# Patient Record
Sex: Female | Born: 1990
Health system: Southern US, Community
[De-identification: ages and names within clinical notes are randomized; demographics above are authoritative.]

## PROBLEM LIST (undated history)

## (undated) DIAGNOSIS — G4730 Sleep apnea, unspecified: Secondary | ICD-10-CM

## (undated) DIAGNOSIS — F32A Depression, unspecified: Secondary | ICD-10-CM

## (undated) DIAGNOSIS — F329 Major depressive disorder, single episode, unspecified: Secondary | ICD-10-CM

## (undated) DIAGNOSIS — E282 Polycystic ovarian syndrome: Secondary | ICD-10-CM

## (undated) DIAGNOSIS — U071 COVID-19: Secondary | ICD-10-CM

## (undated) DIAGNOSIS — E039 Hypothyroidism, unspecified: Secondary | ICD-10-CM

## (undated) DIAGNOSIS — F319 Bipolar disorder, unspecified: Secondary | ICD-10-CM

## (undated) DIAGNOSIS — Z87442 Personal history of urinary calculi: Secondary | ICD-10-CM

## (undated) DIAGNOSIS — Z8742 Personal history of other diseases of the female genital tract: Secondary | ICD-10-CM

## (undated) DIAGNOSIS — J45909 Unspecified asthma, uncomplicated: Secondary | ICD-10-CM

## (undated) DIAGNOSIS — R011 Cardiac murmur, unspecified: Secondary | ICD-10-CM

## (undated) DIAGNOSIS — R7303 Prediabetes: Secondary | ICD-10-CM

## (undated) DIAGNOSIS — G8929 Other chronic pain: Secondary | ICD-10-CM

## (undated) DIAGNOSIS — M549 Dorsalgia, unspecified: Secondary | ICD-10-CM

## (undated) DIAGNOSIS — F419 Anxiety disorder, unspecified: Secondary | ICD-10-CM

## (undated) DIAGNOSIS — F909 Attention-deficit hyperactivity disorder, unspecified type: Secondary | ICD-10-CM

## (undated) DIAGNOSIS — I10 Essential (primary) hypertension: Secondary | ICD-10-CM

## (undated) DIAGNOSIS — T783XXA Angioneurotic edema, initial encounter: Secondary | ICD-10-CM

## (undated) DIAGNOSIS — D649 Anemia, unspecified: Secondary | ICD-10-CM

## (undated) DIAGNOSIS — N912 Amenorrhea, unspecified: Secondary | ICD-10-CM

## (undated) HISTORY — DX: Amenorrhea, unspecified: N91.2

## (undated) HISTORY — DX: Essential (primary) hypertension: I10

## (undated) HISTORY — PX: NO PAST SURGERIES: SHX2092

## (undated) HISTORY — DX: Depression, unspecified: F32.A

## (undated) HISTORY — DX: Hypothyroidism, unspecified: E03.9

## (undated) HISTORY — DX: Unspecified asthma, uncomplicated: J45.909

## (undated) HISTORY — PX: WISDOM TOOTH EXTRACTION: SHX21

## (undated) HISTORY — DX: Other chronic pain: G89.29

## (undated) HISTORY — DX: Angioneurotic edema, initial encounter: T78.3XXA

## (undated) HISTORY — DX: Personal history of other diseases of the female genital tract: Z87.42

---
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
## 2015-04-21 DIAGNOSIS — G8929 Other chronic pain: Secondary | ICD-10-CM | POA: Insufficient documentation

## 2015-04-21 DIAGNOSIS — D229 Melanocytic nevi, unspecified: Secondary | ICD-10-CM | POA: Insufficient documentation

## 2015-04-21 DIAGNOSIS — E282 Polycystic ovarian syndrome: Secondary | ICD-10-CM | POA: Insufficient documentation

## 2015-07-19 DIAGNOSIS — E781 Pure hyperglyceridemia: Secondary | ICD-10-CM | POA: Insufficient documentation

## 2016-02-23 ENCOUNTER — Emergency Department (HOSPITAL_BASED_OUTPATIENT_CLINIC_OR_DEPARTMENT_OTHER)
Admission: EM | Admit: 2016-02-23 | Discharge: 2016-02-24 | Disposition: A | Discharge status: Home / Self Care | Payer: Medicaid Other | Attending: Emergency Medicine | Admitting: Emergency Medicine

## 2016-02-23 ENCOUNTER — Encounter (HOSPITAL_BASED_OUTPATIENT_CLINIC_OR_DEPARTMENT_OTHER): Payer: Self-pay | Admitting: Emergency Medicine

## 2016-02-23 DIAGNOSIS — Z79899 Other long term (current) drug therapy: Secondary | ICD-10-CM | POA: Diagnosis not present

## 2016-02-23 DIAGNOSIS — R519 Headache, unspecified: Secondary | ICD-10-CM

## 2016-02-23 DIAGNOSIS — R51 Headache: Secondary | ICD-10-CM | POA: Insufficient documentation

## 2016-02-23 HISTORY — DX: Bipolar disorder, unspecified: F31.9

## 2016-02-23 HISTORY — DX: Polycystic ovarian syndrome: E28.2

## 2016-02-23 MED ORDER — DIPHENHYDRAMINE HCL 50 MG/ML IJ SOLN
25.0000 mg | Freq: Once | INTRAMUSCULAR | Status: AC
  Administered 2016-02-23: 25 mg via INTRAVENOUS
  Filled 2016-02-23: qty 1

## 2016-02-23 MED ORDER — SODIUM CHLORIDE 0.9 % IV BOLUS (SEPSIS)
1000.0000 mL | Freq: Once | INTRAVENOUS | Status: AC
  Administered 2016-02-23: 1000 mL via INTRAVENOUS

## 2016-02-23 MED ORDER — DEXAMETHASONE SODIUM PHOSPHATE 10 MG/ML IJ SOLN
10.0000 mg | Freq: Once | INTRAMUSCULAR | Status: AC
  Administered 2016-02-23: 10 mg via INTRAVENOUS
  Filled 2016-02-23: qty 1

## 2016-02-23 MED ORDER — METOCLOPRAMIDE HCL 5 MG/ML IJ SOLN
10.0000 mg | Freq: Once | INTRAMUSCULAR | Status: AC
  Administered 2016-02-23: 10 mg via INTRAVENOUS
  Filled 2016-02-23: qty 2

## 2016-02-23 MED ORDER — KETOROLAC TROMETHAMINE 30 MG/ML IJ SOLN
30.0000 mg | Freq: Once | INTRAMUSCULAR | Status: AC
  Administered 2016-02-23: 30 mg via INTRAVENOUS
  Filled 2016-02-23: qty 1

## 2016-02-23 MED ORDER — SODIUM CHLORIDE 0.9 % IV BOLUS (SEPSIS)
1000.0000 mL | Freq: Once | INTRAVENOUS | Status: AC
Start: 2016-02-23 — End: 2016-02-23
  Administered 2016-02-23: 1000 mL via INTRAVENOUS

## 2016-02-23 NOTE — ED Notes (Signed)
PA made aware of pt's response to medication. Awaiting for new orders to be placed.

## 2016-02-23 NOTE — ED Triage Notes (Signed)
Pt sent from UC c/o HA x 24 hours. No hx of migraines. Reports hx of "spinal fluid build up in my brain". Taking ibuprofen and tylenol without relief.

## 2016-02-23 NOTE — ED Provider Notes (Signed)
[facility] Provider Note   CSN: [id number] Arrival date & time: 02/23/16  2017   By signing my name below, I, Soijett Blue, attest that this documentation has been prepared under the direction and in the presence of Montine Circle, PA-C Electronically Signed: Martin, ED Scribe. 02/23/16. 9:35 PM.  History   Chief Complaint Chief Complaint  Patient presents with  . Migraine    HPI Allison Chandler is a 25 y.o. female who presents to the Emergency Department complaining of constant, frontal HA onset 24 hours. Pt notes that she was seen at an Urgent Care for her HA and sent to the ED for further evaluation of her HA. She notes that this HA is not similar to headaches that she has had in the past. Denies sick contacts. Pt is having associated symptoms of sensitivity to light and noise and resolved fever of 102.7 last night. She notes that she has tried ibuprofen and tylenol with no relief of her symptoms. She denies numbness, weakness, nasal congestion, rhinorrhea, and any other symptoms.     The history is provided by the patient. No language interpreter was used.    Past Medical History:  Diagnosis Date  . Bipolar 1 disorder ([place])     There are no active problems to display for this patient.   History reviewed. No pertinent surgical history.  OB History    No data available       Home Medications    Prior to Admission medications   Medication Sig Start Date End Date Taking? Authorizing Provider  ARIPiprazole (ABILIFY) 10 MG tablet Take 10 mg by mouth daily.   Yes Historical Provider, MD  FLUoxetine (PROZAC) 40 MG capsule Take 40 mg by mouth daily.   Yes Historical Provider, MD  lisdexamfetamine (VYVANSE) 50 MG capsule Take 50 mg by mouth daily.   Yes Historical Provider, MD  phentermine 15 MG capsule Take 15 mg by mouth every morning.   Yes Historical Provider, MD  propranolol (INDERAL) 40 MG tablet Take 40 mg by mouth 3 (three) times daily.   Yes  Historical Provider, MD    Family History No family history on file.  Social History Social History  Substance Use Topics  . Smoking status: Never Smoker  . Smokeless tobacco: Never Used  . Alcohol use Yes     Allergies   Wellbutrin [bupropion]   Review of Systems Review of Systems  All other systems reviewed and are negative.    Physical Exam Updated Vital Signs BP 123/86 (BP Location: Left Arm)   Pulse 95   Temp 99.1 F (37.3 C) (Oral)   Resp 20   SpO2 99%   Physical Exam  Constitutional: She is oriented to person, place, and time. She appears well-developed and well-nourished. No distress.  HENT:  Head: Normocephalic and atraumatic.  Eyes: EOM are normal.  Neck: Normal range of motion. Neck supple.  No meningismus. Nl ROM.  Cardiovascular: Normal rate, regular rhythm and normal heart sounds.  Exam reveals no gallop and no friction rub.   No murmur heard. Pulmonary/Chest: Effort normal and breath sounds normal. No respiratory distress. She has no wheezes. She has no rales.  Abdominal: Soft. She exhibits no distension. There is no tenderness.  Musculoskeletal: Normal range of motion.  Neurological: She is alert and oriented to person, place, and time. She has normal strength. No cranial nerve deficit or sensory deficit. Coordination normal.  CN 3-12 intact. Speech is clear. Movements are goal oriented. Nl  finger to nose. No pronator drift. Negative kernig's and brudzinski's sign. Sensation and strength intact throughout.   Skin: Skin is warm and dry.  Psychiatric: She has a normal mood and affect. Her behavior is normal.  Nursing note and vitals reviewed.    ED Treatments / Results  DIAGNOSTIC STUDIES: Oxygen Saturation is 99% on RA, nl by my interpretation.    COORDINATION OF CARE: 9:31 PM Discussed treatment plan with pt at bedside which includes toradol injection, reglan, benadryl, IV fluids, and pt agreed to plan.   Labs (all labs ordered are  listed, but only abnormal results are displayed) Labs Reviewed - No data to display  EKG  EKG Interpretation None       Radiology No results found.  Procedures Procedures (including critical care time)  Medications Ordered in ED Medications - No data to display   Initial Impression / Assessment and Plan / ED Course  I have reviewed the triage vital signs and the nursing notes.  Pertinent labs & imaging results that were available during my care of the patient were reviewed by me and considered in my medical decision making (see chart for details).  Clinical Course    Pt HA treated and improved while in ED.  No fever, no neck stiffness, doubt meningitis. Pt is afebrile with no focal neuro deficits, nuchal rigidity, or change in vision. Pt is to follow up with PCP to discuss prophylactic medication. Pt verbalizes understanding and is agreeable with plan to dc.    Final Clinical Impressions(s) / ED Diagnoses   Final diagnoses:  Nonintractable headache, unspecified chronicity pattern, unspecified headache type    New Prescriptions New Prescriptions   No medications on file   I personally performed the services described in this documentation, which was scribed in my presence. The recorded information has been reviewed and is accurate.       Montine Circle, PA-C 02/24/16 [id number]    Fatima Blank, MD 02/24/16 [phone]

## 2016-05-02 ENCOUNTER — Encounter (HOSPITAL_COMMUNITY): Payer: Self-pay | Admitting: Emergency Medicine

## 2016-05-02 ENCOUNTER — Emergency Department (HOSPITAL_COMMUNITY)
Admission: EM | Admit: 2016-05-02 | Discharge: 2016-05-02 | Disposition: A | Discharge status: Home / Self Care | Payer: Medicaid Other | Attending: Emergency Medicine | Admitting: Emergency Medicine

## 2016-05-02 DIAGNOSIS — R21 Rash and other nonspecific skin eruption: Secondary | ICD-10-CM

## 2016-05-02 HISTORY — DX: Anxiety disorder, unspecified: F41.9

## 2016-05-02 HISTORY — DX: Dorsalgia, unspecified: M54.9

## 2016-05-02 HISTORY — DX: Other chronic pain: G89.29

## 2016-05-02 MED ORDER — DIPHENHYDRAMINE HCL 25 MG PO CAPS
25.0000 mg | ORAL_CAPSULE | Freq: Once | ORAL | Status: AC
  Administered 2016-05-02: 25 mg via ORAL
  Filled 2016-05-02: qty 1

## 2016-05-02 MED ORDER — PREDNISONE 20 MG PO TABS: 40.0000 mg | ORAL_TABLET | Freq: Every day | ORAL | 0 refills | Status: DC

## 2016-05-02 MED ORDER — DIPHENHYDRAMINE HCL 25 MG PO CAPS: 25.0000 mg | ORAL_CAPSULE | Freq: Four times a day (QID) | ORAL | 0 refills | Status: DC | PRN

## 2016-05-02 MED ORDER — HYDROXYZINE HCL 25 MG PO TABS: 25.0000 mg | ORAL_TABLET | Freq: Four times a day (QID) | ORAL | 0 refills | Status: DC | PRN

## 2016-05-02 MED ORDER — PREDNISONE 20 MG PO TABS
40.0000 mg | ORAL_TABLET | Freq: Once | ORAL | Status: AC
  Administered 2016-05-02: 40 mg via ORAL
  Filled 2016-05-02: qty 2

## 2016-05-02 NOTE — ED Triage Notes (Signed)
Patient reports itchy skin rashes at chest onset this week , no oral swelling / respirations unlabored .

## 2016-05-02 NOTE — ED Provider Notes (Signed)
[facility] DEPT Provider Note   CSN: [id number] Arrival date & time: 05/02/16  2055    By signing my name below, I, Allison Chandler, attest that this documentation has been prepared under the direction and in the presence of [place], [place]. Electronically Signed: Macon Chandler, ED Scribe. 05/02/16. 9:40 PM.  History   Chief Complaint Chief Complaint  Patient presents with  . Rash   The history is provided by the patient. No language interpreter was used.   HPI Comments: Allison Chandler is a 25 y.o. female who presents to the Emergency Department complaining of gradually worsening, rash onset today. Pt states the rashes on her chest began as two small dots and they have gradually worsened throughout the day. She states they are itchy and burning. Pt notes rash has spread to both hands while in the ED. She notes taking ibuprofen with minimal relief. She denies the use of new body wash, perfumes or other new exposures. Per Pt, she took her first does of prescribed amoxicillin for her ear infection today, but notes this was done after she noticed the rashes on her chest. She denies throat swelling, chills, fever. No additional complaints at this time.    Past Medical History:  Diagnosis Date  . Anxiety   . Back pain, chronic   . Bipolar 1 disorder ([place])   . PCOD (polycystic ovarian disease)     There are no active problems to display for this patient.   History reviewed. No pertinent surgical history.  OB History    No data available       Home Medications    Prior to Admission medications   Medication Sig Start Date End Date Taking? Authorizing Provider  ARIPiprazole (ABILIFY) 10 MG tablet Take 10 mg by mouth daily.    Historical Provider, MD  FLUoxetine (PROZAC) 40 MG capsule Take 40 mg by mouth daily.    Historical Provider, MD  lisdexamfetamine (VYVANSE) 50 MG capsule Take 50 mg by mouth daily.    Historical Provider, MD  phentermine 15 MG capsule Take 15 mg by  mouth every morning.    Historical Provider, MD  propranolol (INDERAL) 40 MG tablet Take 40 mg by mouth 3 (three) times daily.    Historical Provider, MD    Family History No family history on file.  Social History Social History  Substance Use Topics  . Smoking status: Never Smoker  . Smokeless tobacco: Never Used  . Alcohol use Yes     Allergies   Wellbutrin [bupropion]   Review of Systems Review of Systems  Constitutional: Negative for chills and fever.  Skin: Positive for rash (chest and bilateral hands).  All other systems reviewed and are negative.    Physical Exam Updated Vital Signs BP 138/93 (BP Location: Left Arm)   Pulse 105   Temp 98.4 F (36.9 C) (Oral)   Resp 18   Ht 5\' 3"  (1.6 m)   Wt 202 lb (91.6 kg)   SpO2 100%   BMI 35.78 kg/m   Physical Exam  Constitutional: She appears well-developed and well-nourished.  HENT:  Head: Normocephalic and atraumatic.  Eyes: Conjunctivae are normal. Right eye exhibits no discharge. Left eye exhibits no discharge.  Pulmonary/Chest: Effort normal. No respiratory distress.  Neurological: She is alert. Coordination normal.  Skin: Skin is warm and dry. Rash noted. She is not diaphoretic. No erythema.  Urticarial rash to bilateral medial aspects of breasts and bilateral dorsal aspect of hands No vesicles or pustules No  palm, sole, or intra-oral lesions  Psychiatric: She has a normal mood and affect.  Nursing note and vitals reviewed.    ED Treatments / Results   DIAGNOSTIC STUDIES: Oxygen Saturation is 100% on RA, normal by my interpretation.    COORDINATION OF CARE: 9:29 PM Discussed treatment plan with pt at bedside which includes Benadryl and Deltasone and pt agreed to plan.   Labs (all labs ordered are listed, but only abnormal results are displayed) Labs Reviewed - No data to display  EKG  EKG Interpretation None       Radiology No results found.  Procedures Procedures (including critical  care time)  Medications Ordered in ED Medications  diphenhydrAMINE (BENADRYL) capsule 25 mg (25 mg Oral Given 05/02/16 2202)  predniSONE (DELTASONE) tablet 40 mg (40 mg Oral Given 05/02/16 2202)     Initial Impression / Assessment and Plan / ED Course  I have reviewed the triage vital signs and the nursing notes.  Pertinent labs & imaging results that were available during my care of the patient were reviewed by me and considered in my medical decision making (see chart for details).  Clinical Course     Rash appears allergic and not infectious. We'll cover with a course of prednisone and benadryl. Instructed close f/u with PCP. No evidence of airway involvement or anaphylaxis. ER return precautions given.  Final Clinical Impressions(s) / ED Diagnoses   Final diagnoses:  Rash    New Prescriptions Discharge Medication List as of 05/02/2016  9:48 PM    START taking these medications   Details  diphenhydrAMINE (BENADRYL) 25 mg capsule Take 1 capsule (25 mg total) by mouth every 6 (six) hours as needed for itching (in the evening)., Starting Thu 05/02/2016, Print    hydrOXYzine (ATARAX/VISTARIL) 25 MG tablet Take 1 tablet (25 mg total) by mouth every 6 (six) hours as needed for itching., Starting Thu 05/02/2016, Print    predniSONE (DELTASONE) 20 MG tablet Take 2 tablets (40 mg total) by mouth daily., Starting Thu 05/02/2016, Print        I personally performed the services described in this documentation, which was scribed in my presence. The recorded information has been reviewed and is accurate.    Anne Ng, PA-C 05/02/16 2311    Sherwood Gambler, MD 05/06/16 [phone]

## 2016-05-02 NOTE — Discharge Instructions (Signed)
Take medication as prescribed. Finish the entire course of steroids. Take Benadryl in the evening and Vistaril during the daytime as it should make you less drowsy. Follow up with your primary care provider. Return to the ER for new or worsening symptoms.

## 2016-05-13 ENCOUNTER — Encounter (HOSPITAL_COMMUNITY): Payer: Self-pay | Admitting: Emergency Medicine

## 2016-05-13 ENCOUNTER — Emergency Department (HOSPITAL_COMMUNITY): Payer: Medicaid Other

## 2016-05-13 ENCOUNTER — Emergency Department (HOSPITAL_COMMUNITY)
Admission: EM | Admit: 2016-05-13 | Discharge: 2016-05-14 | Disposition: A | Discharge status: Home / Self Care | Payer: Medicaid Other | Attending: Emergency Medicine | Admitting: Emergency Medicine

## 2016-05-13 DIAGNOSIS — W109XXA Fall (on) (from) unspecified stairs and steps, initial encounter: Secondary | ICD-10-CM | POA: Diagnosis not present

## 2016-05-13 DIAGNOSIS — Y92009 Unspecified place in unspecified non-institutional (private) residence as the place of occurrence of the external cause: Secondary | ICD-10-CM | POA: Diagnosis not present

## 2016-05-13 DIAGNOSIS — W19XXXA Unspecified fall, initial encounter: Secondary | ICD-10-CM

## 2016-05-13 DIAGNOSIS — Y939 Activity, unspecified: Secondary | ICD-10-CM | POA: Diagnosis not present

## 2016-05-13 DIAGNOSIS — S3992XA Unspecified injury of lower back, initial encounter: Secondary | ICD-10-CM | POA: Diagnosis present

## 2016-05-13 DIAGNOSIS — S300XXA Contusion of lower back and pelvis, initial encounter: Secondary | ICD-10-CM

## 2016-05-13 DIAGNOSIS — Y999 Unspecified external cause status: Secondary | ICD-10-CM | POA: Insufficient documentation

## 2016-05-13 LAB — URINALYSIS, ROUTINE W REFLEX MICROSCOPIC
Bilirubin Urine: NEGATIVE
GLUCOSE, UA: NEGATIVE mg/dL
HGB URINE DIPSTICK: NEGATIVE
KETONES UR: NEGATIVE mg/dL
LEUKOCYTES UA: NEGATIVE
Nitrite: NEGATIVE
Protein, ur: NEGATIVE mg/dL
Specific Gravity, Urine: 1.025 (ref 1.005–1.030)
pH: 5 (ref 5.0–8.0)

## 2016-05-13 LAB — BASIC METABOLIC PANEL
ANION GAP: 9 (ref 5–15)
BUN: 11 mg/dL (ref 6–20)
CHLORIDE: 109 mmol/L (ref 101–111)
CO2: 21 mmol/L — AB (ref 22–32)
CREATININE: 0.64 mg/dL (ref 0.44–1.00)
Calcium: 9.8 mg/dL (ref 8.9–10.3)
GFR calc non Af Amer: >60 mL/min (ref >60)
Glucose, Bld: 115 mg/dL — ABNORMAL HIGH (ref 65–99)
Potassium: 3.9 mmol/L (ref 3.5–5.1)
SODIUM: 139 mmol/L (ref 135–145)

## 2016-05-13 LAB — CBC WITH DIFFERENTIAL/PLATELET
BASOS ABS: 0 10*3/uL (ref 0.0–0.1)
BASOS PCT: 1 %
EOS ABS: 0.2 10*3/uL (ref 0.0–0.7)
Eosinophils Relative: 2 %
HEMATOCRIT: 39 % (ref 36.0–46.0)
HEMOGLOBIN: 12.6 g/dL (ref 12.0–15.0)
Lymphocytes Relative: 38 %
Lymphs Abs: 3.1 10*3/uL (ref 0.7–4.0)
MCH: 27.8 pg (ref 26.0–34.0)
MCHC: 32.3 g/dL (ref 30.0–36.0)
MCV: 85.9 fL (ref 78.0–100.0)
MONOS PCT: 9 %
Monocytes Absolute: 0.7 10*3/uL (ref 0.1–1.0)
NEUTROS ABS: 4.3 10*3/uL (ref 1.7–7.7)
NEUTROS PCT: 52 %
Platelets: 270 10*3/uL (ref 150–400)
RBC: 4.54 MIL/uL (ref 3.87–5.11)
RDW: 13.1 % (ref 11.5–15.5)
WBC: 8.3 10*3/uL (ref 4.0–10.5)

## 2016-05-13 LAB — I-STAT BETA HCG BLOOD, ED (MC, WL, AP ONLY)

## 2016-05-13 MED ORDER — OXYCODONE-ACETAMINOPHEN 5-325 MG PO TABS
1.0000 | ORAL_TABLET | Freq: Once | ORAL | Status: AC
  Administered 2016-05-13: 1 via ORAL
  Filled 2016-05-13: qty 1

## 2016-05-13 MED ORDER — CYCLOBENZAPRINE HCL 10 MG PO TABS
10.0000 mg | ORAL_TABLET | Freq: Once | ORAL | Status: AC
  Administered 2016-05-13: 10 mg via ORAL
  Filled 2016-05-13: qty 1

## 2016-05-13 NOTE — ED Provider Notes (Signed)
[facility] DEPT Provider Note   CSN: [id number] Arrival date & time: 05/13/16  2049 By signing my name below, I, Allison Chandler, attest that this documentation has been prepared under the direction and in the presence of Allison Chandler, [place]. Electronically Signed: Georgette Chandler, ED Scribe. 05/13/16. 10:46 PM.  History   Chief Complaint Chief Complaint  Patient presents with  . Fall  . Back Pain   HPI Comments: Allison Chandler is a 25 y.o. female with h/o chronic back pain who presents to the Emergency Department complaining of 8/10 lower back pain s/p mechanical fall 3 days ago. Pt states she tripped and fell down 12 steps. No LOC, pt denies head injury. Pain is exacerbated with movement and positional changes. She has been taking Ibuprofen with no relief. Pt denies fever, chills, abdominal pain, nausea, vomiting, dysuria, hematuria, urinary/bowel incontinence, or any other associated symptoms.   The history is provided by the patient. No language interpreter was used.    Past Medical History:  Diagnosis Date  . Anxiety   . Back pain, chronic   . Bipolar 1 disorder ([place])   . PCOD (polycystic ovarian disease)     There are no active problems to display for this patient.   History reviewed. No pertinent surgical history.  OB History    No data available       Home Medications    Prior to Admission medications   Medication Sig Start Date End Date Taking? Authorizing Provider  ARIPiprazole (ABILIFY) 10 MG tablet Take 10 mg by mouth daily.    Historical Provider, MD  cyclobenzaprine (FLEXERIL) 10 MG tablet Take 1 tablet (10 mg total) by mouth 2 (two) times daily as needed for muscle spasms. 05/14/16   Hope Bunnie Pion, NP  diclofenac (VOLTAREN) 50 MG EC tablet Take 1 tablet (50 mg total) by mouth 2 (two) times daily. 05/14/16   Hope Bunnie Pion, NP  diphenhydrAMINE (BENADRYL) 25 mg capsule Take 1 capsule (25 mg total) by mouth every 6 (six) hours as needed for itching (in the evening).  05/02/16   Olivia Canter Sam, PA-C  FLUoxetine (PROZAC) 40 MG capsule Take 40 mg by mouth daily.    Historical Provider, MD  hydrOXYzine (ATARAX/VISTARIL) 25 MG tablet Take 1 tablet (25 mg total) by mouth every 6 (six) hours as needed for itching. 05/02/16   Olivia Canter Sam, PA-C  lisdexamfetamine (VYVANSE) 50 MG capsule Take 50 mg by mouth daily.    Historical Provider, MD  phentermine 15 MG capsule Take 15 mg by mouth every morning.    Historical Provider, MD  predniSONE (DELTASONE) 20 MG tablet Take 2 tablets (40 mg total) by mouth daily. 05/02/16   Olivia Canter Sam, PA-C  propranolol (INDERAL) 40 MG tablet Take 40 mg by mouth 3 (three) times daily.    Historical Provider, MD    Family History No family history on file.  Social History Social History  Substance Use Topics  . Smoking status: Never Smoker  . Smokeless tobacco: Never Used  . Alcohol use Yes     Allergies   Wellbutrin [bupropion]   Review of Systems Review of Systems  Constitutional: Negative for chills and fever.  HENT: Negative.   Eyes: Negative for visual disturbance.  Respiratory: Negative for shortness of breath.   Gastrointestinal: Negative for abdominal pain, nausea and vomiting.  Genitourinary: Negative for difficulty urinating, dysuria and hematuria.  Musculoskeletal: Positive for back pain.  Skin: Negative for wound.  Neurological: Negative for syncope and  headaches.  Psychiatric/Behavioral: Negative for confusion.     Physical Exam Updated Vital Signs BP 126/70 (BP Location: Left Arm)   Pulse 82   Temp 98.1 F (36.7 C) (Oral)   Resp 16   Ht 5\' 3"  (1.6 m)   Wt 93 kg   SpO2 100%   BMI 36.31 kg/m   Physical Exam  Constitutional: She is oriented to person, place, and time. She appears well-developed and well-nourished. No distress.  HENT:  Head: Normocephalic and atraumatic.  Right Ear: External ear normal.  Left Ear: External ear normal.  Eyes: Conjunctivae and EOM are normal.  Neck: Normal  range of motion. Neck supple.  Cardiovascular: Normal rate and regular rhythm.   Pulses:      Radial pulses are 2+ on the right side, and 2+ on the left side.  Pulmonary/Chest: Effort normal and breath sounds normal. She has no wheezes.  Abdominal: Soft. Bowel sounds are normal. There is no tenderness. There is no CVA tenderness.  Musculoskeletal: Normal range of motion. She exhibits tenderness.       Lumbar back: She exhibits tenderness, bony tenderness and spasm. She exhibits no deformity, no laceration and normal pulse. Decreased range of motion: due to pain.  Tenderness over the lumbar spine. No C-spine or T-spine tenderness. Grips are equal, adequate circulation. Full ROM of BUEs. Straight leg raises without difficulty. Reflexes are normal and symmetric.   Neurological: She is alert and oriented to person, place, and time. She has normal strength. No sensory deficit. Gait normal.  Skin: Skin is warm and dry.  Psychiatric: She has a normal mood and affect. Her behavior is normal.  Nursing note and vitals reviewed.    ED Treatments / Results  DIAGNOSTIC STUDIES: Oxygen Saturation is 100% on RA, normal by my interpretation.    COORDINATION OF CARE: 10:44 PM Discussed treatment plan with pt at bedside which includes x-r and pt agreed to plan.  Labs (all labs ordered are listed, but only abnormal results are displayed) Labs Reviewed  BASIC METABOLIC PANEL - Abnormal; Notable for the following:       Result Value   CO2 21 (*)    Glucose, Bld 115 (*)    All other components within normal limits  CBC WITH DIFFERENTIAL/PLATELET  URINALYSIS, ROUTINE W REFLEX MICROSCOPIC  I-STAT BETA HCG BLOOD, ED ([facility], [facility], [facility] ONLY)    Radiology Dg Lumbar Spine Complete  Result Date: 05/14/2016 CLINICAL DATA:  Pain after fall down steps. EXAM: LUMBAR SPINE - COMPLETE 4+ VIEW COMPARISON:  05/31/2015 lumbar spine radiographs FINDINGS: There is no evidence of acute lumbar spine fracture. Alignment is  normal. Intervertebral disc spaces are maintained. Slightly angulated appearance of the posterior left tenth, eleventh and twelfth ribs are chronic and stable in appearance. Lucency through the left L3 transverse process is believed to be secondary to overlying bowel as the lucency extends beyond cortical margins and therefore not associated with a fracture. IUD noted in the pelvis as before. IMPRESSION: No acute osseous abnormality of the lumbar spine. Electronically Signed   By: Ashley Royalty M.D.   On: 05/14/2016 00:13    Procedures Procedures (including critical care time)  Medications Ordered in ED Medications  oxyCODONE-acetaminophen (PERCOCET/ROXICET) 5-325 MG per tablet 1 tablet (1 tablet Oral Given 05/13/16 2300)  cyclobenzaprine (FLEXERIL) tablet 10 mg (10 mg Oral Given 05/13/16 2300)     Initial Impression / Assessment and Plan / ED Course  I have reviewed the triage vital signs and the  nursing notes.  Pertinent imaging results that were available during my care of the patient were reviewed by me and considered in my medical decision making (see chart for details).  Clinical Course   25 y.o. female with low back pain s/p fall 3 days ago stable for d/c without neuro deficits and no acute findings on x-ray. Will treat for pain and muscle spasm and patient will f/u with ortho if symptoms persist.   Final Clinical Impressions(s) / ED Diagnoses   Final diagnoses:  Contusion of lower back, initial encounter  Fall, initial encounter    New Prescriptions New Prescriptions   CYCLOBENZAPRINE (FLEXERIL) 10 MG TABLET    Take 1 tablet (10 mg total) by mouth 2 (two) times daily as needed for muscle spasms.   DICLOFENAC (VOLTAREN) 50 MG EC TABLET    Take 1 tablet (50 mg total) by mouth 2 (two) times daily.   I personally performed the services described in this documentation, which was scribed in my presence. The recorded information has been reviewed and is accurate.     [address] [place],  [place] 05/14/16 [id number]    Ripley Fraise, MD 05/16/16 [phone]

## 2016-05-13 NOTE — ED Notes (Signed)
Provided pt with ice pack

## 2016-05-13 NOTE — ED Notes (Signed)
Gone to xray  

## 2016-05-13 NOTE — ED Triage Notes (Signed)
Patient slipped and fell at home Saturday , denies LOC /ambulatory , reports low back pain worse with movement /changing positions . No hematuria or fever .

## 2016-05-14 MED ORDER — DICLOFENAC SODIUM 50 MG PO TBEC: 50.0000 mg | DELAYED_RELEASE_TABLET | Freq: Two times a day (BID) | ORAL | 0 refills | Status: DC

## 2016-05-14 MED ORDER — CYCLOBENZAPRINE HCL 10 MG PO TABS: 10.0000 mg | ORAL_TABLET | Freq: Two times a day (BID) | ORAL | 0 refills | Status: DC | PRN

## 2016-05-18 ENCOUNTER — Emergency Department (HOSPITAL_COMMUNITY)
Admission: EM | Admit: 2016-05-18 | Discharge: 2016-05-18 | Disposition: A | Discharge status: Home / Self Care | Payer: Medicaid Other | Attending: Emergency Medicine | Admitting: Emergency Medicine

## 2016-05-18 ENCOUNTER — Encounter (HOSPITAL_COMMUNITY): Payer: Self-pay | Admitting: *Deleted

## 2016-05-18 ENCOUNTER — Emergency Department (HOSPITAL_COMMUNITY): Payer: Medicaid Other

## 2016-05-18 DIAGNOSIS — M5186 Other intervertebral disc disorders, lumbar region: Secondary | ICD-10-CM | POA: Insufficient documentation

## 2016-05-18 DIAGNOSIS — M5126 Other intervertebral disc displacement, lumbar region: Secondary | ICD-10-CM

## 2016-05-18 DIAGNOSIS — M545 Low back pain, unspecified: Secondary | ICD-10-CM

## 2016-05-18 LAB — POC URINE PREG, ED: Preg Test, Ur: NEGATIVE

## 2016-05-18 NOTE — ED Provider Notes (Signed)
[facility] DEPT Provider Note   CSN: [id number] Arrival date & time: 05/18/16  1948  By signing my name below, I, Dora Sims, attest that this documentation has been prepared under the direction and in the presence of Montine Circle, PA-C. Electronically Signed: Dora Sims, Scribe. 05/18/2016. 8:31 PM.  History   Chief Complaint Chief Complaint  Patient presents with  . Back Pain    The history is provided by the patient. No language interpreter was used.     Allison Chandler is a 25 y.o. female with PMHx significant for chronic back pain who presents to the Emergency Department with a chief complaint of sudden onset, constant, unchanged, severe, lower back pain beginning about a week ago. She states her pain is most significant around her tailbone. She reports she was wearing socks and slipped on hardwood steps; she fell down 12 steps and struck her tailbone on each step as she fell. No LOC. She endorses back pain exacerbation with generalized movement and certain positions. She was seen here 3 days after the fall and was prescribed Flexeril and Voltaren with no relief of her back pain. She has been applying ice with transient relief of her back pain and reports no other alleviating factors. She denies bowel/bladder incontinence, numbness, weakness, fever, chills, wounds, or any other associated symptoms. Pt is followed by a back specialist and has an appointment with them in 3 days.   Past Medical History:  Diagnosis Date  . Anxiety   . Back pain, chronic   . Bipolar 1 disorder ([place])   . PCOD (polycystic ovarian disease)     There are no active problems to display for this patient.   History reviewed. No pertinent surgical history.  OB History    No data available       Home Medications    Prior to Admission medications   Medication Sig Start Date End Date Taking? Authorizing Provider  ARIPiprazole (ABILIFY) 10 MG tablet Take 10 mg by mouth daily.     Historical Provider, MD  cyclobenzaprine (FLEXERIL) 10 MG tablet Take 1 tablet (10 mg total) by mouth 2 (two) times daily as needed for muscle spasms. 05/14/16   Hope Bunnie Pion, NP  diclofenac (VOLTAREN) 50 MG EC tablet Take 1 tablet (50 mg total) by mouth 2 (two) times daily. 05/14/16   Hope Bunnie Pion, NP  diphenhydrAMINE (BENADRYL) 25 mg capsule Take 1 capsule (25 mg total) by mouth every 6 (six) hours as needed for itching (in the evening). 05/02/16   Olivia Canter Sam, PA-C  FLUoxetine (PROZAC) 40 MG capsule Take 40 mg by mouth daily.    Historical Provider, MD  hydrOXYzine (ATARAX/VISTARIL) 25 MG tablet Take 1 tablet (25 mg total) by mouth every 6 (six) hours as needed for itching. 05/02/16   Olivia Canter Sam, PA-C  lisdexamfetamine (VYVANSE) 50 MG capsule Take 50 mg by mouth daily.    Historical Provider, MD  phentermine 15 MG capsule Take 15 mg by mouth every morning.    Historical Provider, MD  predniSONE (DELTASONE) 20 MG tablet Take 2 tablets (40 mg total) by mouth daily. 05/02/16   Olivia Canter Sam, PA-C  propranolol (INDERAL) 40 MG tablet Take 40 mg by mouth 3 (three) times daily.    Historical Provider, MD    Family History No family history on file.  Social History Social History  Substance Use Topics  . Smoking status: Never Smoker  . Smokeless tobacco: Never Used  . Alcohol use Yes  Allergies   Wellbutrin [bupropion]   Review of Systems Review of Systems  Constitutional: Negative for chills and fever.  Gastrointestinal:       Negative for bowel incontinence.  Genitourinary:       Negative for bladder incontinence.  Musculoskeletal: Positive for back pain (lower).  Skin: Negative for wound.  Neurological: Negative for syncope, weakness and numbness.     Physical Exam Updated Vital Signs BP 149/94   Pulse 99   Temp 98.9 F (37.2 C) (Oral)   Resp 16   SpO2 100%   Physical Exam  Physical Exam  Constitutional: Pt appears well-developed and well-nourished. No  distress.  HENT:  Head: Normocephalic and atraumatic.  Mouth/Throat: Oropharynx is clear and moist. No oropharyngeal exudate.  Eyes: Conjunctivae are normal.  Neck: Normal range of motion. Neck supple.  No meningismus Cardiovascular: Normal rate, regular rhythm and intact distal pulses.   Pulmonary/Chest: Effort normal and breath sounds normal. No respiratory distress. Pt has no wheezes.  Abdominal: Pt exhibits no distension Musculoskeletal:  Mild lumbar musculature tenderness to palpation, no bony CTLS spine tenderness, deformity, step-off, or crepitus Lymphadenopathy: Pt has no cervical adenopathy.  Neurological: Pt is alert and oriented Speech is clear and goal oriented, follows commands Normal 5/5 strength in upper and lower extremities bilaterally including dorsiflexion and plantar flexion, strong and equal grip strength Sensation intact Great toe extension intact Moves extremities without ataxia, coordination intact  Normal gait Normal balance No Clonus Skin: Skin is warm and dry. No rash noted. Pt is not diaphoretic. No erythema.  Psychiatric: Pt has a normal mood and affect. Behavior is normal.  Nursing note and vitals reviewed.  ED Treatments / Results  Labs (all labs ordered are listed, but only abnormal results are displayed) Labs Reviewed  POC URINE PREG, ED    EKG  EKG Interpretation None       Radiology Ct Lumbar Spine Wo Contrast  Result Date: 05/18/2016 CLINICAL DATA:  Persistent back pain after fall 1 week ago. EXAM: CT LUMBAR SPINE WITHOUT CONTRAST TECHNIQUE: Multidetector CT imaging of the lumbar spine was performed without intravenous contrast administration. Multiplanar CT image reconstructions were also generated. COMPARISON:  Lumbar spine radiograph May 13, 2016 FINDINGS: SEGMENTATION: For the purposes of this report the last well-formed intervertebral disc space is reported as L5-S1, transitional anatomy with partially sacrum live L5 vertebral  body, developmentally smaller L5-S1 disc. ALIGNMENT: Vertebral bodies in alignment, maintenance of the lumbar lordosis. VERTEBRAE: Vertebral bodies and posterior elements are intact. Intervertebral disc heights preserved. No destructive bony lesions. PARASPINAL AND OTHER SOFT TISSUES: Included prevertebral and paraspinal soft tissues are unremarkable. DISC LEVELS: L1-2 thru L3-4: No disc bulge, canal stenosis neural foraminal narrowing. L4-5: Small central disc protrusion. No canal stenosis or neural foraminal narrowing. L5-S1: Transitional anatomy. No canal stenosis or neural foraminal narrowing. IMPRESSION: Transitional anatomy, partially sacralized L5 vertebral body. No fracture or malalignment. Small L4-5 disc protrusion. No canal stenosis or neural foraminal narrowing. Electronically Signed   By: Elon Alas M.D.   On: 05/18/2016 22:17    Procedures Procedures (including critical care time)  DIAGNOSTIC STUDIES: Oxygen Saturation is 100% on RA, normal by my interpretation.    COORDINATION OF CARE: 8:37 PM Discussed treatment plan with pt at bedside and pt agreed to plan.  Medications Ordered in ED Medications - No data to display   Initial Impression / Assessment and Plan / ED Course  I have reviewed the triage vital signs and the nursing notes.  Pertinent labs & imaging results that were available during my care of the patient were reviewed by me and considered in my medical decision making (see chart for details).  Clinical Course    Patient with back pain.  CT as above.  No neurological deficits and normal neuro exam.  Patient is ambulatory.  No loss of bowel or bladder control.  Doubt cauda equina.  Denies fever,  doubt epidural abscess or other lesion. Recommend back exercises, stretching, RICE.  Encouraged the patient that there could be a need for additional workup and/or imaging such as MRI, if the symptoms do not resolve. Patient advised that if the back pain does not  resolve, or radiates, this could progress to more serious conditions and is encouraged to follow-up with PCP or orthopedics within 2 weeks.      Final Clinical Impressions(s) / ED Diagnoses   Final diagnoses:  Lumbar herniated disc  Low back pain without sciatica, unspecified back pain laterality, unspecified chronicity    New Prescriptions New Prescriptions   No medications on file   I personally performed the services described in this documentation, which was scribed in my presence. The recorded information has been reviewed and is accurate.      Montine Circle, PA-C 05/18/16 [place], MD 05/19/16 2048

## 2016-05-18 NOTE — ED Triage Notes (Signed)
The pt fell one week ago hurting her lower back  She was seen here then .  The meds and other meds she was given has not helped  She continues to c/o the back pain  lmp none  iud

## 2016-05-18 NOTE — ED Notes (Signed)
Patient transported to CT 

## 2016-08-31 ENCOUNTER — Emergency Department (HOSPITAL_COMMUNITY)
Admission: EM | Admit: 2016-08-31 | Discharge: 2016-08-31 | Disposition: A | Discharge status: Home / Self Care | Payer: Medicaid Other | Attending: Emergency Medicine | Admitting: Emergency Medicine

## 2016-08-31 ENCOUNTER — Encounter (HOSPITAL_COMMUNITY): Payer: Self-pay | Admitting: Vascular Surgery

## 2016-08-31 ENCOUNTER — Emergency Department (HOSPITAL_COMMUNITY): Payer: Medicaid Other

## 2016-08-31 DIAGNOSIS — J0101 Acute recurrent maxillary sinusitis: Secondary | ICD-10-CM | POA: Diagnosis not present

## 2016-08-31 DIAGNOSIS — R51 Headache: Secondary | ICD-10-CM | POA: Diagnosis present

## 2016-08-31 LAB — BASIC METABOLIC PANEL
Anion gap: 12 (ref 5–15)
BUN: 8 mg/dL (ref 6–20)
CHLORIDE: 106 mmol/L (ref 101–111)
CO2: 18 mmol/L — ABNORMAL LOW (ref 22–32)
Calcium: 9.1 mg/dL (ref 8.9–10.3)
Creatinine, Ser: 0.62 mg/dL (ref 0.44–1.00)
GFR calc Af Amer: >60 mL/min (ref >60)
Glucose, Bld: 108 mg/dL — ABNORMAL HIGH (ref 65–99)
Potassium: 3.9 mmol/L (ref 3.5–5.1)
SODIUM: 136 mmol/L (ref 135–145)

## 2016-08-31 LAB — I-STAT TROPONIN, ED: TROPONIN I, POC: 0 ng/mL (ref 0.00–0.08)

## 2016-08-31 LAB — CBC
HCT: 40.8 % (ref 36.0–46.0)
Hemoglobin: 13.2 g/dL (ref 12.0–15.0)
MCH: 28.1 pg (ref 26.0–34.0)
MCHC: 32.4 g/dL (ref 30.0–36.0)
MCV: 87 fL (ref 78.0–100.0)
PLATELETS: 250 10*3/uL (ref 150–400)
RBC: 4.69 MIL/uL (ref 3.87–5.11)
RDW: 14 % (ref 11.5–15.5)
WBC: 13.9 10*3/uL — AB (ref 4.0–10.5)

## 2016-08-31 LAB — I-STAT CG4 LACTIC ACID, ED: Lactic Acid, Venous: 1.74 mmol/L (ref 0.5–1.9)

## 2016-08-31 MED ORDER — AZITHROMYCIN 250 MG PO TABS
500.0000 mg | ORAL_TABLET | Freq: Once | ORAL | Status: AC
  Administered 2016-08-31: 500 mg via ORAL
  Filled 2016-08-31: qty 2

## 2016-08-31 MED ORDER — SODIUM CHLORIDE 0.9 % IV BOLUS (SEPSIS)
1000.0000 mL | Freq: Once | INTRAVENOUS | Status: AC
  Administered 2016-08-31: 1000 mL via INTRAVENOUS

## 2016-08-31 MED ORDER — AZITHROMYCIN 250 MG PO TABS: 250.0000 mg | ORAL_TABLET | Freq: Every day | ORAL | 0 refills | Status: DC

## 2016-08-31 MED ORDER — KETOROLAC TROMETHAMINE 30 MG/ML IJ SOLN
30.0000 mg | Freq: Once | INTRAMUSCULAR | Status: AC
  Administered 2016-08-31: 30 mg via INTRAVENOUS
  Filled 2016-08-31: qty 1

## 2016-08-31 MED ORDER — BENZONATATE 100 MG PO CAPS: 100.0000 mg | ORAL_CAPSULE | Freq: Three times a day (TID) | ORAL | 0 refills | Status: DC

## 2016-08-31 NOTE — ED Triage Notes (Signed)
Pt reports to the ED for eval of HA, dry cough, body aches, nasal congestions, and SOB x 3 days. States she has tried Ibuprofen, Mucinex, and Robitussin without relief in her symptoms. Denies any known fevers, chills, or N/V/D. 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 She did get the flu shot.

## 2016-08-31 NOTE — ED Provider Notes (Signed)
[facility] DEPT Provider Note   CSN: [id number] Arrival date & time: 08/31/16  1905     History   Chief Complaint Chief Complaint  Patient presents with  . Cough  . Headache  . Shortness of Breath    HPI Allison Chandler is a 25 y.o. female.  Patient without significant contributing medical history presents with cough, congestion, sinus pressure, frontal headache and decreased appetite. She denies fever. No nausea, vomiting or diarrhea. No significant myalgia. She has been taking OTC medications without significant relief.    The history is provided by the patient. No language interpreter was used.  Cough  Associated symptoms include headaches and shortness of breath. Pertinent negatives include no chest pain, no chills, no sore throat and no myalgias.  Headache   Associated symptoms include shortness of breath. Pertinent negatives include no fever, no nausea and no vomiting.  Shortness of Breath  Associated symptoms include headaches and cough. Pertinent negatives include no fever, no sore throat, no chest pain, no vomiting and no rash.    Past Medical History:  Diagnosis Date  . Anxiety   . Back pain, chronic   . Bipolar 1 disorder ([place])   . PCOD (polycystic ovarian disease)     There are no active problems to display for this patient.   History reviewed. No pertinent surgical history.  OB History    No data available       Home Medications    Prior to Admission medications   Medication Sig Start Date End Date Taking? Authorizing Provider  ARIPiprazole (ABILIFY) 10 MG tablet Take 10 mg by mouth daily.    Historical Provider, MD  cyclobenzaprine (FLEXERIL) 10 MG tablet Take 1 tablet (10 mg total) by mouth 2 (two) times daily as needed for muscle spasms. 05/14/16   Hope Bunnie Pion, NP  diclofenac (VOLTAREN) 50 MG EC tablet Take 1 tablet (50 mg total) by mouth 2 (two) times daily. 05/14/16   Hope Bunnie Pion, NP  diphenhydrAMINE (BENADRYL) 25 mg capsule Take 1  capsule (25 mg total) by mouth every 6 (six) hours as needed for itching (in the evening). 05/02/16   Olivia Canter Sam, PA-C  FLUoxetine (PROZAC) 40 MG capsule Take 40 mg by mouth daily.    Historical Provider, MD  hydrOXYzine (ATARAX/VISTARIL) 25 MG tablet Take 1 tablet (25 mg total) by mouth every 6 (six) hours as needed for itching. 05/02/16   Olivia Canter Sam, PA-C  lisdexamfetamine (VYVANSE) 50 MG capsule Take 50 mg by mouth daily.    Historical Provider, MD  phentermine 15 MG capsule Take 15 mg by mouth every morning.    Historical Provider, MD  predniSONE (DELTASONE) 20 MG tablet Take 2 tablets (40 mg total) by mouth daily. 05/02/16   Olivia Canter Sam, PA-C  propranolol (INDERAL) 40 MG tablet Take 40 mg by mouth 3 (three) times daily.    Historical Provider, MD    Family History No family history on file.  Social History Social History  Substance Use Topics  . Smoking status: Never Smoker  . Smokeless tobacco: Never Used  . Alcohol use Yes     Allergies   Wellbutrin [bupropion]   Review of Systems Review of Systems  Constitutional: Positive for appetite change. Negative for chills and fever.  HENT: Positive for congestion, sinus pain and sinus pressure. Negative for facial swelling, sore throat and trouble swallowing.   Eyes: Negative for photophobia.  Respiratory: Positive for cough and shortness of breath.   Cardiovascular:  Negative.  Negative for chest pain.  Gastrointestinal: Negative.  Negative for nausea and vomiting.  Genitourinary: Negative.  Negative for dysuria.  Musculoskeletal: Negative.  Negative for myalgias.  Skin: Negative.  Negative for rash.  Neurological: Positive for headaches.     Physical Exam Updated Vital Signs BP 124/74 (BP Location: Right Arm)   Pulse (!) 114   Temp (!) 100.5 F (38.1 C) (Oral)   Resp 18   Ht 5\' 3"  (1.6 m)   Wt 93 kg   SpO2 97%   BMI 36.31 kg/m   Physical Exam  Constitutional: She is oriented to person, place, and time. She  appears well-developed and well-nourished. No distress.  HENT:  Head: Normocephalic.  Nose: Mucosal edema present. Right sinus exhibits maxillary sinus tenderness and frontal sinus tenderness. Left sinus exhibits maxillary sinus tenderness and frontal sinus tenderness.  Mouth/Throat: Uvula is midline. Mucous membranes are dry.  Neck: Normal range of motion. Neck supple.  Cardiovascular: Regular rhythm.  Tachycardia present.   No murmur heard. Pulmonary/Chest: Effort normal and breath sounds normal. She has no wheezes. She has no rales.  Abdominal: Soft. Bowel sounds are normal. There is no tenderness. There is no rebound and no guarding.  Musculoskeletal: Normal range of motion.  Neurological: She is alert and oriented to person, place, and time.  Skin: Skin is warm and dry. No rash noted.  Psychiatric: She has a normal mood and affect.     ED Treatments / Results  Labs (all labs ordered are listed, but only abnormal results are displayed) Labs Reviewed  BASIC METABOLIC PANEL - Abnormal; Notable for the following:       Result Value   CO2 18 (*)    Glucose, Bld 108 (*)    All other components within normal limits  CBC - Abnormal; Notable for the following:    WBC 13.9 (*)    All other components within normal limits  I-STAT TROPOININ, ED  I-STAT CG4 LACTIC ACID, ED   Results for orders placed or performed during the hospital encounter of 14/43/15  Basic metabolic panel  Result Value Ref Range   Sodium 136 135 - 145 mmol/L   Potassium 3.9 3.5 - 5.1 mmol/L   Chloride 106 101 - 111 mmol/L   CO2 18 (L) 22 - 32 mmol/L   Glucose, Bld 108 (H) 65 - 99 mg/dL   BUN 8 6 - 20 mg/dL   Creatinine, Ser 0.62 0.44 - 1.00 mg/dL   Calcium 9.1 8.9 - 10.3 mg/dL   GFR calc non Af Amer >60 >60 mL/min   GFR calc Af Amer >60 >60 mL/min   Anion gap 12 5 - 15  CBC  Result Value Ref Range   WBC 13.9 (H) 4.0 - 10.5 K/uL   RBC 4.69 3.87 - 5.11 MIL/uL   Hemoglobin 13.2 12.0 - 15.0 g/dL   HCT 40.8  36.0 - 46.0 %   MCV 87.0 78.0 - 100.0 fL   MCH 28.1 26.0 - 34.0 pg   MCHC 32.4 30.0 - 36.0 g/dL   RDW 14.0 11.5 - 15.5 %   Platelets 250 150 - 400 K/uL  I-stat troponin, ED  Result Value Ref Range   Troponin i, poc 0.00 0.00 - 0.08 ng/mL   Comment 3          I-Stat CG4 Lactic Acid, ED  Result Value Ref Range   Lactic Acid, Venous 1.74 0.5 - 1.9 mmol/L    EKG  EKG Interpretation None  Radiology No results found. Dg Chest 2 View  Result Date: 08/31/2016 CLINICAL DATA:  Dyspnea EXAM: CHEST  2 VIEW COMPARISON:  None. FINDINGS: Normal heart size. Normal mediastinal contour. No pneumothorax. No pleural effusion. No pulmonary edema. Mild hazy opacity in the peripheral right lower lung. IMPRESSION: Mild hazy opacity in the peripheral right lower lung, suggesting developing pneumonia. Recommend follow-up PA and lateral post treatment chest radiographs in 4-6 weeks. Electronically Signed   By: Ilona Sorrel M.D.   On: 08/31/2016 20:24    Procedures Procedures (including critical care time)  Medications Ordered in ED Medications  sodium chloride 0.9 % bolus 1,000 mL (not administered)  ketorolac (TORADOL) 30 MG/ML injection 30 mg (not administered)     Initial Impression / Assessment and Plan / ED Course  I have reviewed the triage vital signs and the nursing notes.  Pertinent labs & imaging results that were available during my care of the patient were reviewed by me and considered in my medical decision making (see chart for details).     Patient presents with URI symptoms of cough, congestion, sinus and facial pain. No fever.   Labs show mild leukocytosis of 13.9. Chest x-ray suggests developing PNA. She also has sinus tenderness, low grade fever. Possible sinusitis is considered likely. The patient has received IVF's and toradol and reports she feels significantly better. VS improved. Will start on zithromax, tessalon and recommend PCP follow up.  Final Clinical  Impressions(s) / ED Diagnoses   Final diagnoses:  None   1. Sinusitis  New Prescriptions New Prescriptions   No medications on file     Charlann Lange, Hershal Coria 08/31/16 2211    Pattricia Boss, MD 09/03/16 1944

## 2016-08-31 NOTE — ED Notes (Signed)
Patient transported to X-ray 

## 2017-03-26 DIAGNOSIS — F17200 Nicotine dependence, unspecified, uncomplicated: Secondary | ICD-10-CM | POA: Insufficient documentation

## 2017-03-26 DIAGNOSIS — G932 Benign intracranial hypertension: Secondary | ICD-10-CM | POA: Insufficient documentation

## 2017-03-26 DIAGNOSIS — G43909 Migraine, unspecified, not intractable, without status migrainosus: Secondary | ICD-10-CM | POA: Insufficient documentation

## 2017-03-26 DIAGNOSIS — Z79899 Other long term (current) drug therapy: Secondary | ICD-10-CM | POA: Insufficient documentation

## 2017-03-26 DIAGNOSIS — E282 Polycystic ovarian syndrome: Secondary | ICD-10-CM | POA: Insufficient documentation

## 2017-03-26 DIAGNOSIS — R110 Nausea: Secondary | ICD-10-CM | POA: Insufficient documentation

## 2017-03-26 NOTE — ED Triage Notes (Signed)
Pt c/o headache and nausea x 1 day. Denies vomiting/abdominal pain, no sensitivity to light or sound. Pt took 1000 mg tylenol PTA, no relief.

## 2017-03-27 ENCOUNTER — Encounter (HOSPITAL_COMMUNITY): Payer: Self-pay | Admitting: Emergency Medicine

## 2017-03-27 ENCOUNTER — Emergency Department (HOSPITAL_COMMUNITY)
Admission: EM | Admit: 2017-03-27 | Discharge: 2017-03-27 | Disposition: A | Discharge status: Home / Self Care | Payer: Self-pay | Attending: Emergency Medicine | Admitting: Emergency Medicine

## 2017-03-27 DIAGNOSIS — G43009 Migraine without aura, not intractable, without status migrainosus: Secondary | ICD-10-CM

## 2017-03-27 DIAGNOSIS — Z79899 Other long term (current) drug therapy: Secondary | ICD-10-CM | POA: Insufficient documentation

## 2017-03-27 DIAGNOSIS — R51 Headache: Secondary | ICD-10-CM | POA: Insufficient documentation

## 2017-03-27 DIAGNOSIS — G930 Cerebral cysts: Secondary | ICD-10-CM | POA: Insufficient documentation

## 2017-03-27 DIAGNOSIS — F17200 Nicotine dependence, unspecified, uncomplicated: Secondary | ICD-10-CM | POA: Insufficient documentation

## 2017-03-27 LAB — COMPREHENSIVE METABOLIC PANEL
ALT: 9 U/L — AB (ref 14–54)
AST: 48 U/L — AB (ref 15–41)
Albumin: 3.7 g/dL (ref 3.5–5.0)
Alkaline Phosphatase: 34 U/L — ABNORMAL LOW (ref 38–126)
Anion gap: 12 (ref 5–15)
BILIRUBIN TOTAL: 1.9 mg/dL — AB (ref 0.3–1.2)
BUN: 10 mg/dL (ref 6–20)
CHLORIDE: 100 mmol/L — AB (ref 101–111)
CO2: 23 mmol/L (ref 22–32)
CREATININE: 0.6 mg/dL (ref 0.44–1.00)
Calcium: 9.6 mg/dL (ref 8.9–10.3)
GFR calc Af Amer: >60 mL/min (ref >60)
GLUCOSE: 127 mg/dL — AB (ref 65–99)
Potassium: 5.4 mmol/L — ABNORMAL HIGH (ref 3.5–5.1)
Sodium: 135 mmol/L (ref 135–145)
Total Protein: 6.3 g/dL — ABNORMAL LOW (ref 6.5–8.1)

## 2017-03-27 LAB — URINALYSIS, ROUTINE W REFLEX MICROSCOPIC
Bilirubin Urine: NEGATIVE
GLUCOSE, UA: NEGATIVE mg/dL
HGB URINE DIPSTICK: NEGATIVE
Ketones, ur: NEGATIVE mg/dL
Nitrite: NEGATIVE
PROTEIN: NEGATIVE mg/dL
SPECIFIC GRAVITY, URINE: 1.024 (ref 1.005–1.030)
pH: 5 (ref 5.0–8.0)

## 2017-03-27 LAB — CBC
HCT: 39.4 % (ref 36.0–46.0)
Hemoglobin: 13 g/dL (ref 12.0–15.0)
MCH: 27.8 pg (ref 26.0–34.0)
MCHC: 33 g/dL (ref 30.0–36.0)
MCV: 84.2 fL (ref 78.0–100.0)
PLATELETS: 293 10*3/uL (ref 150–400)
RBC: 4.68 MIL/uL (ref 3.87–5.11)
RDW: 13.8 % (ref 11.5–15.5)
WBC: 11.9 10*3/uL — AB (ref 4.0–10.5)

## 2017-03-27 LAB — I-STAT BETA HCG BLOOD, ED (MC, WL, AP ONLY)

## 2017-03-27 LAB — LIPASE, BLOOD: LIPASE: 24 U/L (ref 11–51)

## 2017-03-27 MED ORDER — SODIUM CHLORIDE 0.9 % IV BOLUS (SEPSIS)
1000.0000 mL | Freq: Once | INTRAVENOUS | Status: AC
  Administered 2017-03-27: 1000 mL via INTRAVENOUS

## 2017-03-27 MED ORDER — ONDANSETRON 4 MG PO TBDP: 4.0000 mg | ORAL_TABLET | Freq: Three times a day (TID) | ORAL | 0 refills | Status: DC | PRN

## 2017-03-27 MED ORDER — DIPHENHYDRAMINE HCL 50 MG/ML IJ SOLN
25.0000 mg | Freq: Once | INTRAMUSCULAR | Status: AC
  Administered 2017-03-27: 25 mg via INTRAVENOUS
  Filled 2017-03-27: qty 1

## 2017-03-27 MED ORDER — METOCLOPRAMIDE HCL 5 MG/ML IJ SOLN
10.0000 mg | Freq: Once | INTRAMUSCULAR | Status: AC
  Administered 2017-03-27: 10 mg via INTRAVENOUS
  Filled 2017-03-27: qty 2

## 2017-03-27 MED ORDER — KETOROLAC TROMETHAMINE 30 MG/ML IJ SOLN
30.0000 mg | Freq: Once | INTRAMUSCULAR | Status: AC
  Administered 2017-03-27: 30 mg via INTRAVENOUS
  Filled 2017-03-27: qty 1

## 2017-03-27 MED ORDER — DEXAMETHASONE SODIUM PHOSPHATE 10 MG/ML IJ SOLN
10.0000 mg | Freq: Once | INTRAMUSCULAR | Status: AC
  Administered 2017-03-27: 10 mg via INTRAVENOUS
  Filled 2017-03-27: qty 1

## 2017-03-27 NOTE — ED Triage Notes (Addendum)
Pt c/o migraine starting at 1730 tonight. Seen yesterday for the same. C/o sensitivity to light, and some blurred vision. A&O x 4, ambulatory with steady gait.

## 2017-03-27 NOTE — ED Provider Notes (Signed)
TIME SEEN: 1:54 AM  CHIEF COMPLAINT: Headache  HPI: Patient is a 26 year old female with history of PCOS, obesity, anxiety, pseudotumor cerebri who presents to the emergency department with complaints of bilateral temporal headache that is a throbbing, sharp pain with associated nausea.  Headache started at 6:30 PM.  Has had a history of similar headaches.  Was told she had migraines with is also feels similar to her pseudotumor.  States she had to have a lumbar puncture at a hospital in [place] for this but at that time was having vision changes.  No vision changes today.  No fevers, head injury, numbness, tingling or focal weakness.  Not on blood thinners.  Tried Tylenol at home without any relief.  ROS: See HPI Constitutional: no fever  Eyes: no drainage  ENT: no runny nose   Cardiovascular:  no chest pain  Resp: no SOB  GI: no vomiting GU: no dysuria Integumentary: no rash  Allergy: no hives  Musculoskeletal: no leg swelling  Neurological: no slurred speech ROS otherwise negative  PAST MEDICAL HISTORY/PAST SURGICAL HISTORY:  Past Medical History:  Diagnosis Date  . Anxiety   . Back pain, chronic   . Bipolar 1 disorder ([place])   . PCOD (polycystic ovarian disease)     MEDICATIONS:  Prior to Admission medications   Medication Sig Start Date End Date Taking? Authorizing Provider  cyclobenzaprine (FLEXERIL) 10 MG tablet Take 1 tablet (10 mg total) by mouth 2 (two) times daily as needed for muscle spasms. 05/14/16  Yes Neese, Marquand, NP  desvenlafaxine (PRISTIQ) 50 MG 24 hr tablet Take 50 mg by mouth daily.   Yes [provider]  diphenhydrAMINE (BENADRYL) 25 mg capsule Take 1 capsule (25 mg total) by mouth every 6 (six) hours as needed for itching (in the evening). 05/02/16  Yes Sam, Serena Y, PA-C  hydrOXYzine (ATARAX/VISTARIL) 25 MG tablet Take 1 tablet (25 mg total) by mouth every 6 (six) hours as needed for itching. 05/02/16  Yes Sam, Serena Y, PA-C  lithium carbonate  300 MG capsule Take 300 mg by mouth 3 (three) times daily with meals.   Yes [provider]  Norgestimate-Eth Estradiol (SPRINTEC 28 PO) Take 1 tablet by mouth daily.   Yes [provider]  phentermine 15 MG capsule Take 15 mg by mouth every morning.   Yes [provider]  QUEtiapine Fumarate (SEROQUEL PO) Take 1 tablet by mouth at bedtime.   Yes [provider]  traZODone (DESYREL) 150 MG tablet Take 150 mg by mouth at bedtime.   Yes [provider]    ALLERGIES:  Allergies  Allergen Reactions  . Wellbutrin [Bupropion]     SOCIAL HISTORY:  Social History  Substance Use Topics  . Smoking status: Current Every Day Smoker  . Smokeless tobacco: Never Used  . Alcohol use Yes    FAMILY HISTORY: No family history on file.  EXAM: BP 135/65 (BP Location: Right Arm)   Pulse 81   Temp 97.7 F (36.5 C) (Oral)   Resp 16   Ht 5\' 2"  (1.575 m)   Wt 95.7 kg (211 lb)   LMP 03/13/2017   SpO2 99%   BMI 38.59 kg/m  CONSTITUTIONAL: Alert and oriented and responds appropriately to questions. Well-appearing; well-nourished HEAD: Normocephalic EYES: Conjunctivae clear, pupils appear equal, EOMI, no papilledema on my examination, normal funduscopic exam ENT: normal nose; moist mucous membranes NECK: Supple, no meningismus, no nuchal rigidity, no LAD  CARD: RRR; S1 and S2 appreciated;  no murmurs, no clicks, no rubs, no gallops RESP: Normal chest excursion without splinting or tachypnea; breath sounds clear and equal bilaterally; no wheezes, no rhonchi, no rales, no hypoxia or respiratory distress, speaking full sentences ABD/GI: Normal bowel sounds; non-distended; soft, non-tender, no rebound, no guarding, no peritoneal signs, no hepatosplenomegaly BACK:  The back appears normal and is non-tender to palpation, there is no CVA tenderness EXT: Normal ROM in all joints; non-tender to palpation; no edema; normal capillary refill; no cyanosis, no calf  tenderness or swelling    SKIN: Normal color for age and race; warm; no rash NEURO: Moves all extremities equally; Strength 5/5 in all four extremities.  Normal sensation diffusely.  CN 2-12 grossly intact.  Normal speech.  Normal gait. PSYCH: The patient's mood and manner are appropriate. Grooming and personal hygiene are appropriate.  MEDICAL DECISION MAKING: Patient here with gradual onset headache without neurologic deficits.  No fever or meningismus.  Has had similar headaches before with migraines but also with pseudotumor cerebri.  No vision changes.  She does not want a lumbar puncture at this time and I feel this is reasonable to try to treat her with migraine cocktail and reassess.  Labs and urine obtained in triage are unremarkable.  Potassium 5.4 but this is hemolyzed.  Urine shows large leukocytes but also many squamous cells and only few bacteria.  She is not having urinary symptoms.  Pregnancy test negative.  Will treat with Toradol, IV fluids, Decadron, Reglan and Benadryl.  Will reassess after migraine cocktail.  ED PROGRESS: 4:00 AM  Pt reports headache completely resolved after migraine cocktail.  Will discharge home with outpatient PCP and neurology follow-up.   Patient will return if she has return of headache that is not controlled with over-the-counter medications or any vision changes, neurologic deficits.  Discussed return precautions.  I do not feel that she needs a lumbar puncture at this time and she agrees.   At this time, I do not feel there is any life-threatening condition present. I have reviewed and discussed all results (EKG, imaging, lab, urine as appropriate) and exam findings with patient/family. I have reviewed nursing notes and appropriate previous records.  I feel the patient is safe to be discharged home without further emergent workup and can continue workup as an outpatient as needed. Discussed usual and customary return precautions. Patient/family verbalize  understanding and are comfortable with this plan.  Outpatient follow-up has been provided if needed. All questions have been answered.     Ward, Delice Bison, DO 03/27/17 [phone]

## 2017-03-27 NOTE — Discharge Instructions (Signed)
You may alternate Tylenol 1000 mg every 6 hours as needed for pain and Ibuprofen 800 mg every 8 hours as needed for pain.  Please take Ibuprofen with food. ° °

## 2017-03-28 ENCOUNTER — Emergency Department (HOSPITAL_COMMUNITY)
Admission: EM | Admit: 2017-03-28 | Discharge: 2017-03-28 | Disposition: A | Discharge status: Home / Self Care | Payer: Self-pay | Attending: Emergency Medicine | Admitting: Emergency Medicine

## 2017-03-28 ENCOUNTER — Emergency Department (HOSPITAL_COMMUNITY)
Admission: EM | Admit: 2017-03-28 | Discharge: 2017-03-29 | Disposition: A | Discharge status: Home / Self Care | Payer: Medicaid Other | Attending: Emergency Medicine | Admitting: Emergency Medicine

## 2017-03-28 ENCOUNTER — Emergency Department (HOSPITAL_COMMUNITY): Payer: Self-pay

## 2017-03-28 DIAGNOSIS — R519 Headache, unspecified: Secondary | ICD-10-CM

## 2017-03-28 DIAGNOSIS — R51 Headache: Secondary | ICD-10-CM

## 2017-03-28 DIAGNOSIS — Z79899 Other long term (current) drug therapy: Secondary | ICD-10-CM | POA: Insufficient documentation

## 2017-03-28 DIAGNOSIS — G930 Cerebral cysts: Secondary | ICD-10-CM | POA: Insufficient documentation

## 2017-03-28 DIAGNOSIS — F17200 Nicotine dependence, unspecified, uncomplicated: Secondary | ICD-10-CM | POA: Insufficient documentation

## 2017-03-28 DIAGNOSIS — T50905A Adverse effect of unspecified drugs, medicaments and biological substances, initial encounter: Secondary | ICD-10-CM | POA: Insufficient documentation

## 2017-03-28 LAB — BASIC METABOLIC PANEL
Anion gap: 9 (ref 5–15)
BUN: 11 mg/dL (ref 6–20)
CHLORIDE: 110 mmol/L (ref 101–111)
CO2: 21 mmol/L — ABNORMAL LOW (ref 22–32)
CREATININE: 0.65 mg/dL (ref 0.44–1.00)
Calcium: 8.4 mg/dL — ABNORMAL LOW (ref 8.9–10.3)
GFR calc Af Amer: >60 mL/min (ref >60)
GFR calc non Af Amer: >60 mL/min (ref >60)
GLUCOSE: 117 mg/dL — AB (ref 65–99)
POTASSIUM: 4 mmol/L (ref 3.5–5.1)
SODIUM: 140 mmol/L (ref 135–145)

## 2017-03-28 LAB — CBC WITH DIFFERENTIAL/PLATELET
BASOS PCT: 0 %
Basophils Absolute: 0 10*3/uL (ref 0.0–0.1)
EOS PCT: 0 %
Eosinophils Absolute: 0 10*3/uL (ref 0.0–0.7)
HCT: 33.7 % — ABNORMAL LOW (ref 36.0–46.0)
HEMOGLOBIN: 10.8 g/dL — AB (ref 12.0–15.0)
Lymphocytes Relative: 30 %
Lymphs Abs: 3.7 10*3/uL (ref 0.7–4.0)
MCH: 27.3 pg (ref 26.0–34.0)
MCHC: 32 g/dL (ref 30.0–36.0)
MCV: 85.1 fL (ref 78.0–100.0)
Monocytes Absolute: 1.1 10*3/uL — ABNORMAL HIGH (ref 0.1–1.0)
Monocytes Relative: 9 %
NEUTROS PCT: 61 %
Neutro Abs: 7.7 10*3/uL (ref 1.7–7.7)
PLATELETS: 284 10*3/uL (ref 150–400)
RBC: 3.96 MIL/uL (ref 3.87–5.11)
RDW: 13.7 % (ref 11.5–15.5)
WBC: 12.6 10*3/uL — AB (ref 4.0–10.5)

## 2017-03-28 MED ORDER — GADOBENATE DIMEGLUMINE 529 MG/ML IV SOLN
20.0000 mL | Freq: Once | INTRAVENOUS | Status: AC | PRN
  Administered 2017-03-28: 20 mL via INTRAVENOUS

## 2017-03-28 MED ORDER — DIPHENHYDRAMINE HCL 50 MG/ML IJ SOLN
25.0000 mg | Freq: Once | INTRAMUSCULAR | Status: AC
  Administered 2017-03-28: 25 mg via INTRAVENOUS
  Filled 2017-03-28: qty 1

## 2017-03-28 MED ORDER — KETOROLAC TROMETHAMINE 30 MG/ML IJ SOLN
30.0000 mg | Freq: Once | INTRAMUSCULAR | Status: AC
  Administered 2017-03-28: 30 mg via INTRAVENOUS
  Filled 2017-03-28: qty 1

## 2017-03-28 MED ORDER — ATROPINE SULFATE 1 % OP SOLN
1.0000 [drp] | Freq: Once | OPHTHALMIC | Status: AC
  Administered 2017-03-28: 1 [drp] via OPHTHALMIC
  Filled 2017-03-28: qty 2

## 2017-03-28 MED ORDER — METOCLOPRAMIDE HCL 5 MG/ML IJ SOLN
10.0000 mg | Freq: Once | INTRAMUSCULAR | Status: AC
  Administered 2017-03-28: 10 mg via INTRAVENOUS
  Filled 2017-03-28: qty 2

## 2017-03-28 MED ORDER — BUTALBITAL-APAP-CAFFEINE 50-325-40 MG PO TABS: 1.0000 | ORAL_TABLET | Freq: Four times a day (QID) | ORAL | 0 refills | Status: DC | PRN

## 2017-03-28 NOTE — ED Notes (Signed)
Patient currently at MRI

## 2017-03-28 NOTE — ED Notes (Signed)
Pt frustrated and states that she should not have to wait to be seen.  States the triage nurse told her she would talk with the charge RN about having her seen sooner.  Attempted to explain triage process.

## 2017-03-28 NOTE — ED Provider Notes (Signed)
[facility] EMERGENCY DEPARTMENT Provider Note   CSN: [id number] Arrival date & time: 03/27/17  2012     History   Chief Complaint Chief Complaint  Patient presents with  . Migraine    HPI Allison Chandler is a 26 y.o. female.  Patient presents to the emergency department with a chief complaint of headache.  She states that she has had a headache for the past 2 days.  She reports being seen yesterday for the same.  She was given a migraine cocktail with significant relief.  She states that her headache came back today.  She denies any fevers or chills.  Denies any neck stiffness.  Denies any numbness, weakness, or tingling.  Reports having history of pseudotumor cerebri, and has had therapeutic lumbar puncture in the past.  She also reports that she has had some blurred vision today.  She denies any other associated symptoms.   The history is provided by the patient. No language interpreter was used.    Past Medical History:  Diagnosis Date  . Anxiety   . Back pain, chronic   . Bipolar 1 disorder ([place])   . PCOD (polycystic ovarian disease)     There are no active problems to display for this patient.   History reviewed. No pertinent surgical history.  OB History    No data available       Home Medications    Prior to Admission medications   Medication Sig Start Date End Date Taking? Authorizing Provider  cyclobenzaprine (FLEXERIL) 10 MG tablet Take 1 tablet (10 mg total) by mouth 2 (two) times daily as needed for muscle spasms. 05/14/16   Ashley Murrain, NP  desvenlafaxine (PRISTIQ) 50 MG 24 hr tablet Take 50 mg by mouth daily.    [provider]  diphenhydrAMINE (BENADRYL) 25 mg capsule Take 1 capsule (25 mg total) by mouth every 6 (six) hours as needed for itching (in the evening). 05/02/16   Sam, Olivia Canter, PA-C  hydrOXYzine (ATARAX/VISTARIL) 25 MG tablet Take 1 tablet (25 mg total) by mouth every 6 (six) hours as needed for itching.  05/02/16   Sam, Olivia Canter, PA-C  lithium carbonate 300 MG capsule Take 300 mg by mouth 3 (three) times daily with meals.    [provider]  Norgestimate-Eth Estradiol (SPRINTEC 28 PO) Take 1 tablet by mouth daily.    [provider]  ondansetron (ZOFRAN ODT) 4 MG disintegrating tablet Take 1 tablet (4 mg total) by mouth every 8 (eight) hours as needed for nausea or vomiting. 03/27/17   Ward, Delice Bison, DO  phentermine 15 MG capsule Take 15 mg by mouth every morning.    [provider]  QUEtiapine Fumarate (SEROQUEL PO) Take 1 tablet by mouth at bedtime.    [provider]  traZODone (DESYREL) 150 MG tablet Take 150 mg by mouth at bedtime.    [provider]    Family History No family history on file.  Social History Social History  Substance Use Topics  . Smoking status: Current Every Day Smoker  . Smokeless tobacco: Never Used  . Alcohol use Yes     Allergies   Wellbutrin [bupropion]   Review of Systems Review of Systems  All other systems reviewed and are negative.    Physical Exam Updated Vital Signs BP 126/71 (BP Location: Left Arm)   Pulse 76   Temp 98.4 F (36.9 C) (Oral)   Resp 16   LMP 03/13/2017  SpO2 99%   Physical Exam  Constitutional: She is oriented to person, place, and time. She appears well-developed and well-nourished.  HENT:  Head: Normocephalic and atraumatic.  Right Ear: External ear normal.  Left Ear: External ear normal.  Eyes: Pupils are equal, round, and reactive to light. Conjunctivae and EOM are normal.  Neck: Normal range of motion. Neck supple.  No pain with neck flexion, no meningismus  Cardiovascular: Normal rate, regular rhythm and normal heart sounds.  Exam reveals no gallop and no friction rub.   No murmur heard. Pulmonary/Chest: Effort normal and breath sounds normal. No respiratory distress. She has no wheezes. She has no rales. She exhibits no tenderness.  Abdominal: Soft. She  exhibits no distension and no mass. There is no tenderness. There is no rebound and no guarding.  Musculoskeletal: Normal range of motion. She exhibits no edema or tenderness.  Normal gait.  Neurological: She is alert and oriented to person, place, and time. She has normal reflexes.  CN 3-12 intact, normal finger to nose, no pronator drift, sensation and strength intact bilaterally.  Skin: Skin is warm and dry.  Psychiatric: She has a normal mood and affect. Her behavior is normal. Judgment and thought content normal.  Nursing note and vitals reviewed.    ED Treatments / Results  Labs (all labs ordered are listed, but only abnormal results are displayed) Labs Reviewed  CBC WITH DIFFERENTIAL/PLATELET  BASIC METABOLIC PANEL    EKG  EKG Interpretation None       Radiology Ct Head Wo Contrast  Result Date: 03/28/2017 CLINICAL DATA:  26 year old female with headache, nausea, and lightheadedness. EXAM: CT HEAD WITHOUT CONTRAST TECHNIQUE: Contiguous axial images were obtained from the base of the skull through the vertex without intravenous contrast. COMPARISON:  None. FINDINGS: Brain: No evidence of acute infarction, hemorrhage, hydrocephalus, extra-axial collection. There is asymmetric prominence of the left cerebellopontine angle (coronal series 5, image 40 and axial series 3, image 8). This may be variant, however underlying lesion is not entirely excluded. Further evaluation with MRI without and with contrast is recommended. Vascular: No hyperdense vessel or unexpected calcification. Skull: Normal. Negative for fracture or focal lesion. Sinuses/Orbits: Minimal mucoperiosteal thickening of paranasal sinuses. No air-fluid levels. The mastoid air cells are clear. Other: None IMPRESSION: 1. No acute intracranial hemorrhage. 2. Asymmetric prominence of the left cerebellopontine angle. Further evaluation with MRI without and with contrast recommended. 3. Mild mucoperiosteal thickening of the  paranasal sinuses. Electronically Signed   By: Anner Crete M.D.   On: 03/28/2017 03:51    Procedures Procedures (including critical care time)  Medications Ordered in ED Medications  diphenhydrAMINE (BENADRYL) injection 25 mg (25 mg Intravenous Given 03/28/17 0431)  ketorolac (TORADOL) 30 MG/ML injection 30 mg (30 mg Intravenous Given 03/28/17 0431)  metoCLOPramide (REGLAN) injection 10 mg (10 mg Intravenous Given 03/28/17 0431)  atropine 1 % ophthalmic solution 1 drop (1 drop Both Eyes Given 03/28/17 0508)     Initial Impression / Assessment and Plan / ED Course  I have reviewed the triage vital signs and the nursing notes.  Pertinent labs & imaging results that were available during my care of the patient were reviewed by me and considered in my medical decision making (see chart for details).     Patient with headache times 2 days.  Reports history of pseudotumor cerebri.  She does not have any history of CT scan in our record.  Will check CT head.  CT head shows a abnormal  angle of the CPA.  Will discuss patient with neurology.  Neurology recommends MRI with and without contrast.  Neurology will consult.  Also recommends dilating the patient's eyes prior to neurology evaluation.  Atropine opthalmic solution given.    MRI shows epidermoid cyst.    Neurology to see patient.  Patient signed out to Hedges, PA-C.  Final Clinical Impressions(s) / ED Diagnoses   Final diagnoses:  None    New Prescriptions New Prescriptions   No medications on file     Montine Circle, Hershal Coria 03/28/17 [place], April, MD 03/28/17 [phone]

## 2017-03-28 NOTE — ED Provider Notes (Addendum)
[facility] EMERGENCY DEPARTMENT Provider Note   CSN: [id number] Arrival date & time: 03/28/17  2023     History   Chief Complaint Chief Complaint  Patient presents with  . Loss of Vision    HPI Allison Chandler is a 26 y.o. female.  The history is provided by the patient.  Eye Problem   This is a recurrent problem. The current episode started yesterday. The problem occurs constantly. The problem has not changed since onset.There is a problem in both eyes. There was no injury mechanism (eyes dilated for fundoscopic exam). The patient is experiencing no pain. There is no history of trauma to the eye. There is no known exposure to pink eye. She does not wear contacts. Pertinent negatives include no numbness, no blurred vision, no discharge, no double vision, no foreign body sensation, no photophobia, no eye redness, no nausea, no vomiting, no tingling, no weakness and no itching. She has tried nothing for the symptoms. The treatment provided no relief.  Seen for recurrent headaches and had dilated eye exam for exclude papilledema and now states she has trouble focusing on things very close to her face but has no trouble with distance.  No HA< no changes in speech or cognition.    Past Medical History:  Diagnosis Date  . Anxiety   . Back pain, chronic   . Bipolar 1 disorder ([place])   . PCOD (polycystic ovarian disease)     There are no active problems to display for this patient.   No past surgical history on file.  OB History    No data available       Home Medications    Prior to Admission medications   Medication Sig Start Date End Date Taking? Authorizing Provider  butalbital-acetaminophen-caffeine (FIORICET, ESGIC) 50-325-40 MG tablet Take 1-2 tablets by mouth every 6 (six) hours as needed for headache. 03/28/17 03/28/18  Hedges, Dellis Filbert, PA-C  cyclobenzaprine (FLEXERIL) 10 MG tablet Take 1 tablet (10 mg total) by mouth 2 (two) times daily as needed  for muscle spasms. 05/14/16   Ashley Murrain, NP  desvenlafaxine (PRISTIQ) 50 MG 24 hr tablet Take 50 mg by mouth daily.    [provider]  diphenhydrAMINE (BENADRYL) 25 mg capsule Take 1 capsule (25 mg total) by mouth every 6 (six) hours as needed for itching (in the evening). 05/02/16   Sam, Olivia Canter, PA-C  hydrOXYzine (ATARAX/VISTARIL) 25 MG tablet Take 1 tablet (25 mg total) by mouth every 6 (six) hours as needed for itching. 05/02/16   Sam, Olivia Canter, PA-C  lithium carbonate 300 MG capsule Take 300 mg by mouth 3 (three) times daily with meals.    [provider]  Norgestimate-Eth Estradiol (SPRINTEC 28 PO) Take 1 tablet by mouth daily.    [provider]  ondansetron (ZOFRAN ODT) 4 MG disintegrating tablet Take 1 tablet (4 mg total) by mouth every 8 (eight) hours as needed for nausea or vomiting. 03/27/17   Ward, Delice Bison, DO  phentermine 15 MG capsule Take 15 mg by mouth every morning.    [provider]  QUEtiapine Fumarate (SEROQUEL PO) Take 1 tablet by mouth at bedtime.    [provider]  traZODone (DESYREL) 150 MG tablet Take 150 mg by mouth at bedtime.    [provider]    Family History No family history on file.  Social History Social History  Substance Use Topics  . Smoking status: Current Every Day Smoker  .  Smokeless tobacco: Never Used  . Alcohol use Yes     Allergies   Wellbutrin [bupropion]   Review of Systems Review of Systems  Eyes: Negative for blurred vision, double vision, photophobia, pain, discharge and redness.  Gastrointestinal: Negative for nausea and vomiting.  Musculoskeletal: Negative for back pain, neck pain and neck stiffness.  Skin: Negative for itching.  Neurological: Negative for dizziness, tingling, facial asymmetry, speech difficulty, weakness, light-headedness, numbness and headaches.  All other systems reviewed and are negative.    Physical Exam Updated Vital Signs BP (!) 146/113    Pulse 86   Temp 98.6 F (37 C) (Oral)   Resp 12   LMP 03/13/2017   SpO2 100%   Physical Exam  Constitutional: She is oriented to person, place, and time. She appears well-developed and well-nourished. No distress.  HENT:  Head: Normocephalic and atraumatic.  Nose: Nose normal.  Mouth/Throat: Oropharynx is clear and moist. No oropharyngeal exudate.  Eyes: Pupils are equal, round, and reactive to light. Conjunctivae and EOM are normal. Right eye exhibits no discharge. Left eye exhibits no discharge.  Dilated but reactive no proptosis  Neck: Normal range of motion. Neck supple.  Cardiovascular: Normal rate, regular rhythm, normal heart sounds and intact distal pulses.   Pulmonary/Chest: Effort normal and breath sounds normal. She has no wheezes. She has no rales.  Abdominal: Soft. Bowel sounds are normal. She exhibits no mass. There is no tenderness. There is no rebound and no guarding.  Musculoskeletal: Normal range of motion.  Neurological: She is alert and oriented to person, place, and time. She displays normal reflexes. No cranial nerve deficit or sensory deficit. She exhibits normal muscle tone. Coordination normal.  Skin: Skin is warm and dry. Capillary refill takes less than 2 seconds.  Psychiatric: She has a normal mood and affect.     ED Treatments / Results   Vitals:   03/28/17 2330 03/29/17 0000  BP: 134/75 120/62  Pulse: 81 81  Resp:    Temp:    SpO2: 99% 99%    Radiology Ct Head Wo Contrast  Addendum Date: 03/28/2017   ADDENDUM REPORT: 03/28/2017 18:13 ADDENDUM: Following reviewing of the MRI, the asymmetric appearance of the CP angles is related to a cystic lesion in the right CPA. Please see the MRI report. Electronically Signed   By: Anner Crete M.D.   On: 03/28/2017 18:13   Result Date: 03/28/2017 CLINICAL DATA:  26 year old female with headache, nausea, and lightheadedness. EXAM: CT HEAD WITHOUT CONTRAST TECHNIQUE: Contiguous axial images were  obtained from the base of the skull through the vertex without intravenous contrast. COMPARISON:  None. FINDINGS: Brain: No evidence of acute infarction, hemorrhage, hydrocephalus, extra-axial collection. There is asymmetric prominence of the left cerebellopontine angle (coronal series 5, image 40 and axial series 3, image 8). This may be variant, however underlying lesion is not entirely excluded. Further evaluation with MRI without and with contrast is recommended. Vascular: No hyperdense vessel or unexpected calcification. Skull: Normal. Negative for fracture or focal lesion. Sinuses/Orbits: Minimal mucoperiosteal thickening of paranasal sinuses. No air-fluid levels. The mastoid air cells are clear. Other: None IMPRESSION: 1. No acute intracranial hemorrhage. 2. Asymmetric prominence of the left cerebellopontine angle. Further evaluation with MRI without and with contrast recommended. 3. Mild mucoperiosteal thickening of the paranasal sinuses. Electronically Signed: By: Anner Crete M.D. On: 03/28/2017 03:51   Mr Jeri Cos And Wo Contrast  Result Date: 03/28/2017 CLINICAL DATA:  Throbbing sharp bitemporal headache. History of migraines,  pseudotumor cerebral RI, obesity. EXAM: MRI HEAD WITHOUT AND WITH CONTRAST TECHNIQUE: Multiplanar, multiecho pulse sequences of the brain and surrounding structures were obtained without and with intravenous contrast. CONTRAST:  20 cc MultiHance COMPARISON:  CT HEAD March 28, 2017 at 0330 hours FINDINGS: INTRACRANIAL CONTENTS: No reduced diffusion to suggest acute ischemia or hyperacute demyelination. No susceptibility artifact to suggest hemorrhage. The ventricles and sulci are normal for patient's age. No suspicious parenchymal signal, masses, mass effect. No abnormal intraparenchymal or extra-axial enhancement. No abnormal extra-axial fluid collections. 2.4 x 1.1 cm RIGHT cerebellar pontine angle cystic mass, T2 bright signal with reduced diffusion, low T1 signal and  no solid enhancement. Linear enhancement medially most compatible with displaced vessel. Mass-effect on RIGHT trigeminal nerve root entry zone. VASCULAR: Normal major intracranial vascular flow voids present at skull base. Dural venous sinus flow voids present. SKULL AND UPPER CERVICAL SPINE: No abnormal sellar expansion. No suspicious calvarial bone marrow signal. Craniocervical junction maintained. SINUSES/ORBITS: Trace ethmoid mucosal thickening. Mastoid air cells are well aerated.The included ocular globes and orbital contents are non-suspicious. OTHER: None. IMPRESSION: 1. No acute intracranial process. 2. 2.4 x 1.1 cm RIGHT cerebellar pontine angle epidermoid cyst. 3. Otherwise negative MRI of the head with and without contrast. Electronically Signed   By: Elon Alas M.D.   On: 03/28/2017 06:10     Visual Acuity  Right Eye Distance: 20/50 Left Eye Distance: 20/50 Bilateral Distance: 20/50  Right Eye Near:   Left Eye Near:    Bilateral Near:     Procedures Procedures (including critical care time)  Seen by neurology, Dr. Lorraine Lax, see note  105 Case d/w Dr. Nancy Fetter of ophthalmology:  Symptoms are consistent with effects of dilating drops.  Dilation and symptoms may last 3 days at the shortest 7 days at the longest depending on how the patient metabolizes medication.  Wear Sunglasses at all times while awake.  Use rewetting drops and reading glasses for up close tasks     Final Clinical Impressions(s) / ED Diagnoses   Final diagnoses:  None    New Prescriptions New Prescriptions   No medications on file  Greater than 10 minutes spent in consultation with patient regarding dilation of eyes.  All questions answered to the patient's satisfaction.  Patient wants to know why she is not on Topamax.  Topamax would decrease the effect of her birth control markedly and patient is unwilling to stop this medication as she desires contraception.  This is why she was placed on fiorcet.  I have  advised close follow up with neurology to discuss the epidermoid cyst seen on MRI and also to discuss control medications for her migraines and Idiopathic intracranial HTN that will not affect her contraception.  Patient verbalizes understanding of all verbal instructions and agrees to follow up.  She states all questions have been answered at this time but would like a work note.  This has been provided with her AVS.     Strict return precautions given.  Strict return precautions given for  Shortness of breath, swelling or the lips or tongue, chest pain, dyspnea on exertion, new weakness or numbness changes in vision or speech,  Inability to tolerate liquids or food, changes in voice cough, altered mental status or any concerns. No signs of systemic illness or infection. The patient is nontoxic-appearing on exam and vital signs are within normal limits.    I have reviewed the triage vital signs and the nursing notes. Pertinent labs &imaging results that  were available during my care of the patient were reviewed by me and considered in my medical decision making (see chart for details).  After history, exam, and medical workup I feel the patient has been appropriately medically screened and is safe for discharge home. Pertinent diagnoses were discussed with the patient. Patient was given return precautions.    Ivyana Locey, MD 03/29/17 [id number]    Veatrice Kells, MD 03/29/17 0120

## 2017-03-28 NOTE — ED Triage Notes (Signed)
Pt presents with vision changes after being seen in ER last night for migraine; pt was given medications for migraine and eyes were dilated and patient reports loss of close up vision; pt frustrated as she attempted to address this with RN last night before being discharged but was still discharged

## 2017-03-28 NOTE — ED Notes (Signed)
PA at bedside.

## 2017-03-28 NOTE — ED Notes (Signed)
Pt voices understanding of discharge instructions. NAD. Ambulatory with steady gait. A/o x4.

## 2017-03-28 NOTE — Discharge Instructions (Signed)
Please read attached information. If you experience any new or worsening signs or symptoms please return to the emergency room for evaluation. Please follow-up with your primary care provider or specialist as discussed. Please use medication prescribed only as directed and discontinue taking if you have any concerning signs or symptoms.   °

## 2017-03-28 NOTE — Consult Note (Signed)
Neurology Consultation  Referring Physician: Mr Allene Pyo  CC: Headache   History is obtained from:Patient   HPI: Allison Chandler is a 26 y.o. female with PMH of Bipolar disorder,PCOS, obesity, Idiopathic Intracranial HTN  diagnosed in 2013 presents with headachex 2 days.  She presents to the emergency department with complaints of bilateral temporal headache that is dull aching with nausea yesterday that began at 6.30 and lasted for several hours that resolved with migraine cocktail. She presents again with dull aching headache today. She also feels she has blurring of vison with out spotters, in both eyes.   States she was diagnosed with pseudotumor in [facility] in 2013. She apparently had an LP in the ER which showed increased pressure. She was started on Topamax but stopped taking them as she lost her insurance. She has been having occasional mild headaches once a week since then usually resolve with tylenol and rest. She describes this headaches occur in the back of her head or bifrontal, usually not throbbing or associated with photophobia/phonophobia.   She has recently switched from IUD to OCP 2 months ago  and may have been having increased frequency of headaches. She also states she has not lost any weight since her initial diagnosis of pseudotumor cerebri. She has not seen a neurologist for her condition. She has a had an eye exam 1 year ago where she thinks they looked at her fundus and made no mention of any disc swelling.   A CT head performed in the ER showed asymmetric prominence of the right cerebellopontine angle with MRI showing presence of epidermoid cyst.   ROS: A 14 point ROS was performed and is negative except as noted in the HPI.  Past Medical History:  Diagnosis Date  . Anxiety   . Back pain, chronic   . Bipolar 1 disorder ([place])   . PCOD (polycystic ovarian disease)      No family history on file.   Social History:  reports that she has been smoking.  She  has never used smokeless tobacco. She reports that she drinks alcohol. She reports that she does not use drugs.   Exam: Current vital signs: BP 126/84 (BP Location: Left Arm)   Pulse 72   Temp 98.8 F (37.1 C) (Oral)   Resp 14   LMP 03/13/2017   SpO2 100%  Vital signs in last 24 hours: Temp:  [98.3 F (36.8 C)-98.8 F (37.1 C)] 98.8 F (37.1 C) (10/26 0615) Pulse Rate:  [72-102] 72 (10/26 0615) Resp:  [14-18] 14 (10/26 0615) BP: (126-143)/(71-84) 126/84 (10/26 0615) SpO2:  [98 %-100 %] 100 % (10/26 0615)   Physical Exam  Constitutional: Appears well-developed and well-nourished. Obese.  Psych: Affect appropriate to situation Eyes: No scleral injection HENT: No OP obstrucion Head: Normocephalic.  Cardiovascular: Normal rate and regular rhythm.  Respiratory: Effort normal and breath sounds normal to anterior ascultation GI: Soft.  No distension. There is no tenderness.  Skin: WDI  Neuro: Mental Status: Patient is awake, alert, oriented to person, place, month, year, and situation. Patient is able to give a clear and coherent history. No signs of aphasia or neglect Fundus: No evidence of papilledema.  Cranial Nerves: II: Visual Fields are full. Pupils are equal, round, and reactive to light.  III,IV, VI: EOMI without ptosis or diploplia.  V: Facial sensation is symmetric to temperature VII: Facial movement is symmetric.  VIII: hearing is intact to voice X: Uvula elevates symmetrically XI: Shoulder shrug is symmetric. XII: tongue  is midline without atrophy or fasciculations.  Motor: Tone is normal. Bulk is normal. 5/5 strength was present in all four extremities.  Sensory: Sensation is symmetric to light touch and temperature in the arms and legs. Deep Tendon Reflexes: 2+ and symmetric in the biceps and patellae. Plantars: Toes are downgoing bilaterally.  Cerebellar: FNF and HKS are intact bilaterally   ASSESSMENT AND PLAN   Headache Likely tension type vs  migraine headache No evidence of papilledema, visual fields grossly intact and headache is not chronic making IIH less likely. MRI shows flow in venous sinuses and given low clinical suspicion of CVST, dedicated MRV not performed.  Will start patient on Topamax for prevention as patient has headaches almost every week Counseled on weight loss Counseled on smoking cessation   She will need follow up with Neurology as outpatient for headaches and monitoring benign epidermoid cyst.

## 2017-03-28 NOTE — ED Notes (Signed)
Patient transported to CT scan . 

## 2017-03-29 ENCOUNTER — Encounter (HOSPITAL_COMMUNITY): Payer: Self-pay | Admitting: Emergency Medicine

## 2017-03-29 NOTE — Progress Notes (Signed)
Allison Chandler returns to the ER as she is having trouble with near vision since receiving eye drops for dilation earlier this morning. She is wearing sunglasses. On examination her pupils remain dilated, VF are intact in all 4 quadrants. EOMI intact and color vision is intact. The patient is able to read sentences normally when placed 1 feet away, however when brought closer she is unable focus. I reassured her this was expected after pupillary dilation and may persist longer than 24 hrs.

## 2017-04-03 NOTE — ED Provider Notes (Signed)
Pt requested copy of work note from previous visits. Pt given note stating when she was seen and return date per Dr. Randal Buba.    Fransico Meadow, PA-C 04/03/17 [place], MD 04/04/17 [phone]

## 2017-04-04 ENCOUNTER — Emergency Department (HOSPITAL_COMMUNITY)
Admission: EM | Admit: 2017-04-04 | Discharge: 2017-04-04 | Disposition: A | Discharge status: Home / Self Care | Payer: Medicaid Other | Attending: Emergency Medicine | Admitting: Emergency Medicine

## 2017-04-04 ENCOUNTER — Encounter (HOSPITAL_COMMUNITY): Payer: Self-pay | Admitting: Emergency Medicine

## 2017-04-04 DIAGNOSIS — F17200 Nicotine dependence, unspecified, uncomplicated: Secondary | ICD-10-CM | POA: Insufficient documentation

## 2017-04-04 DIAGNOSIS — T50905S Adverse effect of unspecified drugs, medicaments and biological substances, sequela: Secondary | ICD-10-CM | POA: Insufficient documentation

## 2017-04-04 DIAGNOSIS — Y829 Unspecified medical devices associated with adverse incidents: Secondary | ICD-10-CM | POA: Insufficient documentation

## 2017-04-04 DIAGNOSIS — T887XXS Unspecified adverse effect of drug or medicament, sequela: Secondary | ICD-10-CM | POA: Insufficient documentation

## 2017-04-04 DIAGNOSIS — H538 Other visual disturbances: Secondary | ICD-10-CM | POA: Insufficient documentation

## 2017-04-04 DIAGNOSIS — Z79899 Other long term (current) drug therapy: Secondary | ICD-10-CM | POA: Insufficient documentation

## 2017-04-04 NOTE — Discharge Instructions (Signed)
I spoke to Dr. Manuella Ghazi (ophthalmology) who recommends conservative approach. Atropine drops can cause prolonged side effects. Eye dilation will slowly improve and can last up to 2-3 weeks. He recommends you purchase "readers" (2+) for work. Contact an eye doctor an make an appointment for re-evaluation in 2 week in case your symptoms persist.

## 2017-04-04 NOTE — ED Provider Notes (Signed)
[facility] DEPT Provider Note   CSN: [id number] Arrival date & time: 04/04/17  1140     History   Chief Complaint Chief Complaint  Patient presents with  . Visual Field Change    HPI Allison Chandler is a 26 y.o. female presents to the ED for persistent bilateral blurred vision x 8 days. Blurred vision to nearby objects only like computer and cell phone. Is having to hold cell phone at arm's length to be able to see. Pt was seen in ED 1 week ago for headache, she had CT, MRI and dilation of eyes (atropine) for full evaluation, work up was benign. Eyes have been dilated since. She was seen in ED the day after eyes were initially dilated for persistent blurred vision and told side effects of blurred vision was normal for up to 1 week. States the blurred vision has slightly improved but has not completely gone away and it has now been 8 days. She denies eye pain, drainage, redness. Has headache from straining to see. Has not been to work in 1 week. Gets regular eye exam for contacts and glasses w/o without similar problems in the past. Has tried rewetting drops without relief.   HPI  Past Medical History:  Diagnosis Date  . Anxiety   . Back pain, chronic   . Bipolar 1 disorder ([place])   . PCOD (polycystic ovarian disease)     There are no active problems to display for this patient.   History reviewed. No pertinent surgical history.  OB History    No data available       Home Medications    Prior to Admission medications   Medication Sig Start Date End Date Taking? Authorizing Provider  butalbital-acetaminophen-caffeine (FIORICET, ESGIC) 50-325-40 MG tablet Take 1-2 tablets by mouth every 6 (six) hours as needed for headache. 03/28/17 03/28/18  Hedges, Dellis Filbert, PA-C  cyclobenzaprine (FLEXERIL) 10 MG tablet Take 1 tablet (10 mg total) by mouth 2 (two) times daily as needed for muscle spasms. 05/14/16   Ashley Murrain, NP  desvenlafaxine (PRISTIQ) 50  MG 24 hr tablet Take 50 mg by mouth daily.    [provider]  diphenhydrAMINE (BENADRYL) 25 mg capsule Take 1 capsule (25 mg total) by mouth every 6 (six) hours as needed for itching (in the evening). 05/02/16   Sam, Olivia Canter, PA-C  hydrOXYzine (ATARAX/VISTARIL) 25 MG tablet Take 1 tablet (25 mg total) by mouth every 6 (six) hours as needed for itching. 05/02/16   Sam, Olivia Canter, PA-C  lithium carbonate 300 MG capsule Take 300 mg by mouth 3 (three) times daily with meals.    [provider]  Norgestimate-Eth Estradiol (SPRINTEC 28 PO) Take 1 tablet by mouth daily.    [provider]  ondansetron (ZOFRAN ODT) 4 MG disintegrating tablet Take 1 tablet (4 mg total) by mouth every 8 (eight) hours as needed for nausea or vomiting. 03/27/17   Ward, Delice Bison, DO  phentermine 15 MG capsule Take 15 mg by mouth every morning.    [provider]  QUEtiapine Fumarate (SEROQUEL PO) Take 1 tablet by mouth at bedtime.    [provider]  traZODone (DESYREL) 150 MG tablet Take 150 mg by mouth at bedtime.    [provider]    Family History No family history on file.  Social History Social History  Substance Use Topics  . Smoking status: Current Every Day Smoker  . Smokeless tobacco: Never Used  .  Alcohol use Yes     Allergies   Wellbutrin [bupropion]   Review of Systems Review of Systems  All other systems reviewed and are negative.    Physical Exam Updated Vital Signs BP (!) 123/92 (BP Location: Left Arm)   Pulse 99   Temp 99.6 F (37.6 C) (Oral)   Resp 18   LMP 03/13/2017   SpO2 99%   Physical Exam  Constitutional: She is oriented to person, place, and time. She appears well-developed and well-nourished. No distress.  NAD.  HENT:  Head: Normocephalic and atraumatic.  Right Ear: External ear normal.  Left Ear: External ear normal.  Nose: Nose normal.  Eyes:  Bilateral pupils 4 dilated but reactive Normal PERRL and  EOMs Lids, conjunctiva and sclera normal No discharge  Visual acuity unchanged from previous  Neck: Normal range of motion. Neck supple.  Cardiovascular: Normal rate, regular rhythm and normal heart sounds.   No murmur heard. Pulmonary/Chest: Effort normal and breath sounds normal. She has no wheezes.  Musculoskeletal: Normal range of motion. She exhibits no deformity.  Neurological: She is alert and oriented to person, place, and time.  Skin: Skin is warm and dry. Capillary refill takes less than 2 seconds.  Psychiatric: She has a normal mood and affect. Her behavior is normal. Judgment and thought content normal.  Nursing note and vitals reviewed.    ED Treatments / Results  Labs (all labs ordered are listed, but only abnormal results are displayed) Labs Reviewed - No data to display  EKG  EKG Interpretation None       Radiology No results found.  Procedures Procedures (including critical care time)  Medications Ordered in ED Medications - No data to display   Initial Impression / Assessment and Plan / ED Course  I have reviewed the triage vital signs and the nursing notes.  Pertinent labs & imaging results that were available during my care of the patient were reviewed by me and considered in my medical decision making (see chart for details).    26 year old female presents to ED for persistent, gradually improving bilateral blurred vision to the nearby objects only. Blurred vision started after her eyes were dilated with atropine. This is her second visit to ED for evaluation of blurred vision from atropine. On exam, her pupils are dilated but are reacting appropriately to light. Given prolonged side effect of atropine ophthalmology was consultative today again. I spoke to Dr. Manuella Ghazi who states atropine can cause eye dilation and blurred vision for up to 2 weeks. He recommended patient get readers to be able to return to work. I discussed plan with patient and reassured  her, she verbalized understanding and is agreeable with plan. I encouraged her to find an ophthalmologist to reevaluate her in 2 weeks in case symptoms do not improve.  Final Clinical Impressions(s) / ED Diagnoses   Final diagnoses:  Medication side effect, sequela  Blurred vision, bilateral    New Prescriptions New Prescriptions   No medications on file     Arlean Hopping 04/04/17 1352    Lacretia Leigh, MD 04/07/17 1453

## 2017-04-04 NOTE — ED Triage Notes (Signed)
Patient reports that she was having headaches and seen at [facility] last week and had her eyes dilated to check for otic nerve problems. Patient reports that it has been 7 days and still having issues with eyes being dilated and not able to see up close and working on her computer at work like prior.

## 2017-06-02 ENCOUNTER — Emergency Department (HOSPITAL_COMMUNITY)
Admission: EM | Admit: 2017-06-02 | Discharge: 2017-06-02 | Discharge status: Against Medical Advice | Payer: Medicaid Other | Attending: Emergency Medicine | Admitting: Emergency Medicine

## 2017-06-02 ENCOUNTER — Emergency Department (HOSPITAL_COMMUNITY)
Admission: EM | Admit: 2017-06-02 | Discharge: 2017-06-02 | Disposition: A | Discharge status: Home / Self Care | Payer: Medicaid Other | Attending: Emergency Medicine | Admitting: Emergency Medicine

## 2017-06-02 ENCOUNTER — Encounter (HOSPITAL_COMMUNITY): Payer: Self-pay

## 2017-06-02 ENCOUNTER — Other Ambulatory Visit: Payer: Self-pay

## 2017-06-02 DIAGNOSIS — Z87891 Personal history of nicotine dependence: Secondary | ICD-10-CM | POA: Insufficient documentation

## 2017-06-02 DIAGNOSIS — Z79899 Other long term (current) drug therapy: Secondary | ICD-10-CM | POA: Insufficient documentation

## 2017-06-02 DIAGNOSIS — Z5321 Procedure and treatment not carried out due to patient leaving prior to being seen by health care provider: Secondary | ICD-10-CM | POA: Insufficient documentation

## 2017-06-02 DIAGNOSIS — R1030 Lower abdominal pain, unspecified: Secondary | ICD-10-CM

## 2017-06-02 DIAGNOSIS — R1084 Generalized abdominal pain: Secondary | ICD-10-CM | POA: Insufficient documentation

## 2017-06-02 DIAGNOSIS — N3000 Acute cystitis without hematuria: Secondary | ICD-10-CM

## 2017-06-02 DIAGNOSIS — R197 Diarrhea, unspecified: Secondary | ICD-10-CM | POA: Insufficient documentation

## 2017-06-02 LAB — CBC WITH DIFFERENTIAL/PLATELET
BASOS PCT: 1 %
Basophils Absolute: 0.1 10*3/uL (ref 0.0–0.1)
EOS ABS: 0.3 10*3/uL (ref 0.0–0.7)
EOS PCT: 3 %
HCT: 42.2 % (ref 36.0–46.0)
Hemoglobin: 13.8 g/dL (ref 12.0–15.0)
Lymphocytes Relative: 38 %
Lymphs Abs: 4.1 10*3/uL — ABNORMAL HIGH (ref 0.7–4.0)
MCH: 27.8 pg (ref 26.0–34.0)
MCHC: 32.7 g/dL (ref 30.0–36.0)
MCV: 84.9 fL (ref 78.0–100.0)
MONO ABS: 0.8 10*3/uL (ref 0.1–1.0)
MONOS PCT: 7 %
Neutro Abs: 5.6 10*3/uL (ref 1.7–7.7)
Neutrophils Relative %: 51 %
PLATELETS: 289 10*3/uL (ref 150–400)
RBC: 4.97 MIL/uL (ref 3.87–5.11)
RDW: 13.6 % (ref 11.5–15.5)
WBC: 10.9 10*3/uL — ABNORMAL HIGH (ref 4.0–10.5)

## 2017-06-02 LAB — COMPREHENSIVE METABOLIC PANEL
ALBUMIN: 3.9 g/dL (ref 3.5–5.0)
ALK PHOS: 43 U/L (ref 38–126)
ALT: 40 U/L (ref 14–54)
AST: 37 U/L (ref 15–41)
Anion gap: 8 (ref 5–15)
BILIRUBIN TOTAL: 0.7 mg/dL (ref 0.3–1.2)
BUN: 7 mg/dL (ref 6–20)
CALCIUM: 9.2 mg/dL (ref 8.9–10.3)
CO2: 25 mmol/L (ref 22–32)
CREATININE: 0.62 mg/dL (ref 0.44–1.00)
Chloride: 106 mmol/L (ref 101–111)
GFR calc Af Amer: >60 mL/min (ref >60)
GFR calc non Af Amer: >60 mL/min (ref >60)
GLUCOSE: 94 mg/dL (ref 65–99)
Potassium: 3.6 mmol/L (ref 3.5–5.1)
SODIUM: 139 mmol/L (ref 135–145)
TOTAL PROTEIN: 7.1 g/dL (ref 6.5–8.1)

## 2017-06-02 LAB — URINALYSIS, ROUTINE W REFLEX MICROSCOPIC
BILIRUBIN URINE: NEGATIVE
GLUCOSE, UA: NEGATIVE mg/dL
Hgb urine dipstick: NEGATIVE
Ketones, ur: NEGATIVE mg/dL
NITRITE: NEGATIVE
PH: 6 (ref 5.0–8.0)
Protein, ur: NEGATIVE mg/dL
SPECIFIC GRAVITY, URINE: 1.017 (ref 1.005–1.030)

## 2017-06-02 LAB — WET PREP, GENITAL
Clue Cells Wet Prep HPF POC: NONE SEEN
Sperm: NONE SEEN
TRICH WET PREP: NONE SEEN
YEAST WET PREP: NONE SEEN

## 2017-06-02 LAB — PREGNANCY, URINE: Preg Test, Ur: NEGATIVE

## 2017-06-02 MED ORDER — LOPERAMIDE HCL 2 MG PO CAPS: 2.0000 mg | ORAL_CAPSULE | Freq: Four times a day (QID) | ORAL | 0 refills | Status: DC | PRN

## 2017-06-02 MED ORDER — SODIUM CHLORIDE 0.9 % IV BOLUS (SEPSIS)
1000.0000 mL | Freq: Once | INTRAVENOUS | Status: AC
  Administered 2017-06-02: 1000 mL via INTRAVENOUS

## 2017-06-02 MED ORDER — CEPHALEXIN 500 MG PO CAPS: 1000.0000 mg | ORAL_CAPSULE | Freq: Two times a day (BID) | ORAL | 0 refills | Status: DC

## 2017-06-02 NOTE — ED Notes (Signed)
Patient was dismissed by Registration. Patient had not left. Patient readmitted. Triage redone.

## 2017-06-02 NOTE — ED Provider Notes (Signed)
[facility] DEPT Provider Note   CSN: [id number] Arrival date & time: 06/02/17  1537     History   Chief Complaint Chief Complaint  Patient presents with  . Abdominal Pain    HPI Allison Chandler is a 26 y.o. female.  HPI Patient reports diarrhea for approximately 5 days.  She is having 4-5 stools per day.  She gets cramping pain just prior to bowel movement.  No fever no vomiting.  She has noticed some general weakness.  No known sick contacts.  Patient has city water.  She reports she did have STD testing before Christmas.  She reports there, they did blood work for HIV testing.  She reports she has not had any call backs to let her know that she had any transmissible disease. Past Medical History:  Diagnosis Date  . Anxiety   . Back pain, chronic   . Bipolar 1 disorder ([place])   . PCOD (polycystic ovarian disease)     There are no active problems to display for this patient.   History reviewed. No pertinent surgical history.  OB History    No data available       Home Medications    Prior to Admission medications   Medication Sig Start Date End Date Taking? Authorizing Provider  hydrOXYzine (ATARAX/VISTARIL) 25 MG tablet Take 1 tablet (25 mg total) by mouth every 6 (six) hours as needed for itching. 05/02/16  Yes Sam, Serena Y, PA-C  lithium carbonate 300 MG capsule Take 300 mg by mouth 3 (three) times daily with meals.   Yes [provider]  Norgestimate-Eth Estradiol (SPRINTEC 28 PO) Take 1 tablet by mouth daily.   Yes [provider]  traZODone (DESYREL) 150 MG tablet Take 150 mg by mouth at bedtime.   Yes [provider]  cephALEXin (KEFLEX) 500 MG capsule Take 2 capsules (1,000 mg total) by mouth 2 (two) times daily. 06/02/17   Charlesetta Shanks, MD  loperamide (IMODIUM) 2 MG capsule Take 1 capsule (2 mg total) by mouth 4 (four) times daily as needed for diarrhea or loose stools. 06/02/17   Charlesetta Shanks,  MD    Family History Family History  Problem Relation Age of Onset  . Hypertension Mother   . Ulcerative colitis Mother     Social History Social History   Tobacco Use  . Smoking status: Former Smoker    Types: Cigarettes  . Smokeless tobacco: Never Used  Substance Use Topics  . Alcohol use: Yes    Comment: occasionally  . Drug use: No     Allergies   Wellbutrin [bupropion]   Review of Systems Review of Systems 10 Systems reviewed and are negative for acute change except as noted in the HPI.   Physical Exam Updated Vital Signs BP 115/68 (BP Location: Left Arm)   Pulse 70   Resp 18   Ht 5\' 2"  (1.575 m)   Wt 97.5 kg (215 lb)   LMP 05/09/2017   SpO2 100%   BMI 39.32 kg/m   Physical Exam  Constitutional: She is oriented to person, place, and time. She appears well-developed and well-nourished. No distress.  Patient is ambulatory about the emergency department with nonantalgic gait.  HENT:  Head: Normocephalic and atraumatic.  Eyes: Conjunctivae are normal.  Neck: Neck supple.  Cardiovascular: Normal rate and regular rhythm.  No murmur heard. Pulmonary/Chest: Effort normal and breath sounds normal. No respiratory distress.  Abdominal: Soft. She exhibits no distension. There is tenderness. There  is no guarding.  Mild suprapubic tenderness to palpation.  Genitourinary:  Genitourinary Comments: Normal external female genitalia.  Mild amount of discharge in vaginal vault.  Cervix nontender.  Adnexa nontender.  No mass.  Musculoskeletal: Normal range of motion. She exhibits no edema.  Neurological: She is alert and oriented to person, place, and time. No cranial nerve deficit. She exhibits normal muscle tone. Coordination normal.  Skin: Skin is warm and dry.  Psychiatric: She has a normal mood and affect.  Nursing note and vitals reviewed.    ED Treatments / Results  Labs (all labs ordered are listed, but only abnormal results are displayed) Labs Reviewed    WET PREP, GENITAL - Abnormal; Notable for the following components:      Result Value   WBC, Wet Prep HPF POC MANY (*)    All other components within normal limits  URINALYSIS, ROUTINE W REFLEX MICROSCOPIC - Abnormal; Notable for the following components:   APPearance HAZY (*)    Leukocytes, UA SMALL (*)    Bacteria, UA RARE (*)    Squamous Epithelial / LPF 0-5 (*)    All other components within normal limits  CBC WITH DIFFERENTIAL/PLATELET - Abnormal; Notable for the following components:   WBC 10.9 (*)    Lymphs Abs 4.1 (*)    All other components within normal limits  PREGNANCY, URINE  COMPREHENSIVE METABOLIC PANEL  GC/CHLAMYDIA PROBE AMP ([place]) NOT AT [facility]    EKG  EKG Interpretation None       Radiology No results found.  Procedures Procedures (including critical care time)  Medications Ordered in ED Medications  sodium chloride 0.9 % bolus 1,000 mL (1,000 mLs Intravenous New Bag/Given 06/02/17 1803)     Initial Impression / Assessment and Plan / ED Course  I have reviewed the triage vital signs and the nursing notes.  Pertinent labs & imaging results that were available during my care of the patient were reviewed by me and considered in my medical decision making (see chart for details).      Final Clinical Impressions(s) / ED Diagnoses   Final diagnoses:  Lower abdominal pain  Diarrhea, unspecified type  Acute cystitis without hematuria  Patient nontoxic and alert.  Abdominal examination is nonsurgical.  At this time most consistent with viral diarrheal illness.  Patient does have mild symptoms of UTI.  Will add Keflex.  Patient can continue symptomatic treatment for diarrhea with Imodium and oral fluids.  ED Discharge Orders        Ordered    cephALEXin (KEFLEX) 500 MG capsule  2 times daily     06/02/17 2025    loperamide (IMODIUM) 2 MG capsule  4 times daily PRN     06/02/17 2025       Charlesetta Shanks, MD 06/02/17 2030

## 2017-06-02 NOTE — ED Triage Notes (Signed)
Patient c/o bilateral lower abdominal pain and diarrhea x 5 days.

## 2017-06-02 NOTE — ED Notes (Signed)
Patient not being transferred. Wrong charting on patient.

## 2017-06-02 NOTE — ED Notes (Signed)
Bed: WA10 Expected date:  Expected time:  Means of arrival:  Comments: Hold for triage 4 

## 2017-06-02 NOTE — ED Notes (Signed)
Bed: [facility]PT1 Expected date:  Expected time:  Means of arrival:  Comments: 

## 2017-06-02 NOTE — ED Notes (Signed)
Bed: [facility]PT4 Expected date:  Expected time:  Means of arrival:  Comments: 

## 2017-06-04 LAB — GC/CHLAMYDIA PROBE AMP (~~LOC~~) NOT AT ARMC
CHLAMYDIA, DNA PROBE: POSITIVE — AB
Neisseria Gonorrhea: NEGATIVE

## 2017-06-06 ENCOUNTER — Emergency Department (HOSPITAL_COMMUNITY)
Admission: EM | Admit: 2017-06-06 | Discharge: 2017-06-06 | Disposition: A | Discharge status: Home / Self Care | Payer: BLUE CROSS/BLUE SHIELD | Attending: Emergency Medicine | Admitting: Emergency Medicine

## 2017-06-06 ENCOUNTER — Other Ambulatory Visit: Payer: Self-pay

## 2017-06-06 ENCOUNTER — Encounter (HOSPITAL_COMMUNITY): Payer: Self-pay

## 2017-06-06 DIAGNOSIS — Z87891 Personal history of nicotine dependence: Secondary | ICD-10-CM | POA: Insufficient documentation

## 2017-06-06 DIAGNOSIS — N898 Other specified noninflammatory disorders of vagina: Secondary | ICD-10-CM | POA: Diagnosis not present

## 2017-06-06 DIAGNOSIS — A084 Viral intestinal infection, unspecified: Secondary | ICD-10-CM | POA: Insufficient documentation

## 2017-06-06 DIAGNOSIS — Z79899 Other long term (current) drug therapy: Secondary | ICD-10-CM | POA: Diagnosis not present

## 2017-06-06 DIAGNOSIS — R1030 Lower abdominal pain, unspecified: Secondary | ICD-10-CM

## 2017-06-06 LAB — CBC
HCT: 40.5 % (ref 36.0–46.0)
HEMOGLOBIN: 13.4 g/dL (ref 12.0–15.0)
MCH: 28.1 pg (ref 26.0–34.0)
MCHC: 33.1 g/dL (ref 30.0–36.0)
MCV: 84.9 fL (ref 78.0–100.0)
Platelets: 273 10*3/uL (ref 150–400)
RBC: 4.77 MIL/uL (ref 3.87–5.11)
RDW: 13 % (ref 11.5–15.5)
WBC: 9.2 10*3/uL (ref 4.0–10.5)

## 2017-06-06 LAB — COMPREHENSIVE METABOLIC PANEL
ALK PHOS: 40 U/L (ref 38–126)
ALT: 40 U/L (ref 14–54)
ANION GAP: 5 (ref 5–15)
AST: 35 U/L (ref 15–41)
Albumin: 3.8 g/dL (ref 3.5–5.0)
BUN: 9 mg/dL (ref 6–20)
CALCIUM: 9.5 mg/dL (ref 8.9–10.3)
CHLORIDE: 110 mmol/L (ref 101–111)
CO2: 25 mmol/L (ref 22–32)
Creatinine, Ser: 0.61 mg/dL (ref 0.44–1.00)
GFR calc Af Amer: >60 mL/min (ref >60)
Glucose, Bld: 99 mg/dL (ref 65–99)
Potassium: 4.1 mmol/L (ref 3.5–5.1)
SODIUM: 140 mmol/L (ref 135–145)
Total Bilirubin: 0.5 mg/dL (ref 0.3–1.2)
Total Protein: 6.8 g/dL (ref 6.5–8.1)

## 2017-06-06 LAB — URINALYSIS, ROUTINE W REFLEX MICROSCOPIC
Bilirubin Urine: NEGATIVE
GLUCOSE, UA: NEGATIVE mg/dL
HGB URINE DIPSTICK: NEGATIVE
KETONES UR: NEGATIVE mg/dL
Nitrite: NEGATIVE
PH: 6 (ref 5.0–8.0)
PROTEIN: NEGATIVE mg/dL
Specific Gravity, Urine: 1.019 (ref 1.005–1.030)

## 2017-06-06 LAB — I-STAT BETA HCG BLOOD, ED (MC, WL, AP ONLY): I-stat hCG, quantitative: <5 m[IU]/mL (ref <5)

## 2017-06-06 LAB — LIPASE, BLOOD: LIPASE: 25 U/L (ref 11–51)

## 2017-06-06 MED ORDER — LIDOCAINE HCL (PF) 1 % IJ SOLN
INTRAMUSCULAR | Status: AC
  Administered 2017-06-06: 2 mL
  Filled 2017-06-06: qty 5

## 2017-06-06 MED ORDER — SODIUM CHLORIDE 0.9 % IV BOLUS (SEPSIS)
1000.0000 mL | Freq: Once | INTRAVENOUS | Status: AC
  Administered 2017-06-06: 1000 mL via INTRAVENOUS

## 2017-06-06 MED ORDER — CEFTRIAXONE SODIUM 250 MG IJ SOLR
250.0000 mg | Freq: Once | INTRAMUSCULAR | Status: AC
  Administered 2017-06-06: 250 mg via INTRAMUSCULAR
  Filled 2017-06-06: qty 250

## 2017-06-06 MED ORDER — AZITHROMYCIN 250 MG PO TABS
1000.0000 mg | ORAL_TABLET | Freq: Once | ORAL | Status: AC
Start: 2017-06-06 — End: 2017-06-06
  Administered 2017-06-06: 1000 mg via ORAL
  Filled 2017-06-06: qty 4

## 2017-06-06 NOTE — ED Notes (Signed)
Pelvic cart set up at the bedside. PA notified.

## 2017-06-06 NOTE — ED Notes (Signed)
RN stated to Probation officer that she would obtain labs at the start of IV.

## 2017-06-06 NOTE — Discharge Instructions (Signed)
Please read attached information. If you experience any new or worsening signs or symptoms please return to the emergency room for evaluation. Please follow-up with your primary care provider or specialist as discussed.  °

## 2017-06-06 NOTE — ED Provider Notes (Signed)
[facility] DEPT Provider Note   CSN: [id number] Arrival date & time: 06/06/17  7619  History   Chief Complaint Chief Complaint  Patient presents with  . Abdominal Pain    HPI Allison Chandler is a 26 y.o. female.  HPI   27 year old female presents today with complaints of abdominal pain, diarrhea, positive STD testing.  Patient notes that on 26 December (10 days ago) he developed lower abdominal cramping bilateral with associated diarrhea.  She notes approximately 6 bowel movements per day described as "spaghetti like" and "watery".  She denies any blood, she denies any fever, nausea or vomiting.  Patient notes that she was seen in the emergency room, tested for STDs and discharged with Imodium.  She notes this did not improve her symptoms.  Patient denies any close sick contacts, recent travel, abnormal food or drink.  She notes she is able to maintain adequate p.o. Intake.. She notes the diarrhea is worse after eating or drinking.  Patient denies any significant vaginal discharge at this time.    Past Medical History:  Diagnosis Date  . Anxiety   . Back pain, chronic   . Bipolar 1 disorder ([place])   . PCOD (polycystic ovarian disease)     There are no active problems to display for this patient.   History reviewed. No pertinent surgical history.  OB History    No data available       Home Medications    Prior to Admission medications   Medication Sig Start Date End Date Taking? Authorizing Provider  acetaminophen (TYLENOL) 500 MG tablet Take 1,000 mg by mouth. Every 6 to 8 hours as needed for pain   Yes [provider]  cephALEXin (KEFLEX) 500 MG capsule Take 2 capsules (1,000 mg total) by mouth 2 (two) times daily. 06/02/17  Yes Charlesetta Shanks, MD  hydrOXYzine (ATARAX/VISTARIL) 25 MG tablet Take 1 tablet (25 mg total) by mouth every 6 (six) hours as needed for itching. 05/02/16  Yes Sam, Serena Y, PA-C  ibuprofen (ADVIL,MOTRIN)  200 MG tablet Take 600 mg by mouth. Every 8 hours as needed for pain   Yes [provider]  lithium carbonate 300 MG capsule Take 300 mg by mouth 3 (three) times daily with meals.   Yes [provider]  loperamide (IMODIUM) 2 MG capsule Take 1 capsule (2 mg total) by mouth 4 (four) times daily as needed for diarrhea or loose stools. 06/02/17  Yes Charlesetta Shanks, MD  traZODone (DESYREL) 150 MG tablet Take 150 mg by mouth at bedtime.   Yes [provider]    Family History Family History  Problem Relation Age of Onset  . Hypertension Mother   . Ulcerative colitis Mother     Social History Social History   Tobacco Use  . Smoking status: Former Smoker    Types: Cigarettes  . Smokeless tobacco: Never Used  Substance Use Topics  . Alcohol use: Yes    Comment: occasionally  . Drug use: No     Allergies   Wellbutrin [bupropion]   Review of Systems Review of Systems  All other systems reviewed and are negative.    Physical Exam Updated Vital Signs BP 113/69 (BP Location: Left Arm)   Pulse 85   Temp 98.5 F (36.9 C) (Oral)   Resp 14   Ht 5\' 2"  (1.575 m)   Wt 97.5 kg (215 lb)   LMP 05/09/2017   SpO2 100%   BMI 39.32 kg/m  Physical Exam  Constitutional: She is oriented to person, place, and time. She appears well-developed and well-nourished.  HENT:  Head: Normocephalic and atraumatic.  Eyes: Conjunctivae are normal. Pupils are equal, round, and reactive to light. Right eye exhibits no discharge. Left eye exhibits no discharge. No scleral icterus.  Neck: Normal range of motion. No JVD present. No tracheal deviation present.  Pulmonary/Chest: Effort normal. No stridor.  Abdominal: Soft. She exhibits no distension and no mass. There is tenderness. There is no rebound and no guarding. No hernia.  TTP of bilateral lower abd TTP- non focal- no upper abd TTP  Genitourinary:  Genitourinary Comments: Purulent vaginal discharge-no cervical motion  tenderness  Neurological: She is alert and oriented to person, place, and time. Coordination normal.  Psychiatric: She has a normal mood and affect. Her behavior is normal. Judgment and thought content normal.  Nursing note and vitals reviewed.   ED Treatments / Results  Labs (all labs ordered are listed, but only abnormal results are displayed) Labs Reviewed  URINALYSIS, ROUTINE W REFLEX MICROSCOPIC - Abnormal; Notable for the following components:      Result Value   APPearance HAZY (*)    Leukocytes, UA TRACE (*)    Bacteria, UA RARE (*)    Squamous Epithelial / LPF 0-5 (*)    All other components within normal limits  GASTROINTESTINAL PANEL BY PCR, STOOL (REPLACES STOOL CULTURE)  LIPASE, BLOOD  COMPREHENSIVE METABOLIC PANEL  CBC  HIV ANTIBODY (ROUTINE TESTING)  RPR  I-STAT BETA HCG BLOOD, ED ([facility], [facility], [facility] ONLY)    EKG  EKG Interpretation None       Radiology No results found.  Procedures Procedures (including critical care time)  Medications Ordered in ED Medications  cefTRIAXone (ROCEPHIN) injection 250 mg (not administered)  azithromycin (ZITHROMAX) tablet 1,000 mg (not administered)  sodium chloride 0.9 % bolus 1,000 mL (1,000 mLs Intravenous New Bag/Given 06/06/17 1545)     Initial Impression / Assessment and Plan / ED Course  I have reviewed the triage vital signs and the nursing notes.  Pertinent labs & imaging results that were available during my care of the patient were reviewed by me and considered in my medical decision making (see chart for details).      Final Clinical Impressions(s) / ED Diagnoses   Final diagnoses:  Viral gastroenteritis  Vaginal discharge  Lower abdominal pain    Labs: HIV antibody, RPR, urine analysis, i-STAT beta-hCG, lipase, CMP CBC  Imaging:  Consults:  Therapeutics: Ceftriaxone, azithromycin  Discharge Meds:   Assessment/Plan: 27 year old female presents today with complaints of diarrhea, positive  chlamydia, and abdominal pain.  Patient is 10 days into these diarrhea episodes.  She has not had any bowel movement while here in the ED.  She has generalized lower abdominal pain nonfocal is afebrile with no white count no tachycardia or fever; no blood in stool.  I have very low suspicion for any acute bacterial infection including appendicitis, diverticulitis, or bacterial diarrheal illness.  I discussed with the patient the low suspicion for bacterial infection, I discussed CT abdomen and pelvis and the risks and benefits, patient would like to avoid CT analysis at this time I find this reasonable as she appears to be a reasonable patient with ability to return with any new or worsening signs or symptoms.  Patient was not able to provide a stool sample here, she was treated for her positive chlamydia results, her pelvic exam showed no signs of pelvic inflammatory disease.  Patient will continue with symptomatic care instructions at home, she is encouraged to follow-up as an outpatient with primary care if symptoms persist and return immediately if any new or worsening signs or symptoms present.  Patient agreed to today's plan, she verbalized understanding and agreement, was given strict return precautions.  No further questions or concerns at the time of discharge.      ED Discharge Orders    None       Francee Gentile 06/06/17 1738    Little, Wenda Overland, MD 06/06/17 [phone]

## 2017-06-06 NOTE — ED Triage Notes (Signed)
Patient c/o bilateral lower abdominal pain and diarrhea x 9 days. Patient was seen 5 days ago and states she was diagnosed with a UTI. Patient states diarrhea is more frequent and can not control at times.

## 2017-06-07 LAB — RPR: RPR Ser Ql: NONREACTIVE

## 2017-06-07 LAB — HIV ANTIBODY (ROUTINE TESTING W REFLEX): HIV Screen 4th Generation wRfx: NONREACTIVE

## 2017-07-09 ENCOUNTER — Encounter (HOSPITAL_COMMUNITY): Payer: Self-pay

## 2017-07-09 ENCOUNTER — Emergency Department (HOSPITAL_COMMUNITY): Payer: BLUE CROSS/BLUE SHIELD

## 2017-07-09 ENCOUNTER — Emergency Department (HOSPITAL_COMMUNITY)
Admission: EM | Admit: 2017-07-09 | Discharge: 2017-07-09 | Disposition: A | Discharge status: Home / Self Care | Payer: BLUE CROSS/BLUE SHIELD | Attending: Emergency Medicine | Admitting: Emergency Medicine

## 2017-07-09 DIAGNOSIS — Z79899 Other long term (current) drug therapy: Secondary | ICD-10-CM | POA: Diagnosis not present

## 2017-07-09 DIAGNOSIS — F319 Bipolar disorder, unspecified: Secondary | ICD-10-CM | POA: Diagnosis not present

## 2017-07-09 DIAGNOSIS — F419 Anxiety disorder, unspecified: Secondary | ICD-10-CM | POA: Insufficient documentation

## 2017-07-09 DIAGNOSIS — J029 Acute pharyngitis, unspecified: Secondary | ICD-10-CM | POA: Diagnosis not present

## 2017-07-09 DIAGNOSIS — Z87891 Personal history of nicotine dependence: Secondary | ICD-10-CM | POA: Insufficient documentation

## 2017-07-09 LAB — RAPID STREP SCREEN (MED CTR MEBANE ONLY): Streptococcus, Group A Screen (Direct): NEGATIVE

## 2017-07-09 NOTE — ED Notes (Signed)
Pt reports sore throat that started yesterday and cough that started Monday night.

## 2017-07-09 NOTE — Discharge Instructions (Addendum)
Please read attached information. If you experience any new or worsening signs or symptoms please return to the emergency room for evaluation. Please follow-up with your primary care provider or specialist as discussed.  °

## 2017-07-09 NOTE — ED Provider Notes (Signed)
[facility] EMERGENCY DEPARTMENT Provider Note   CSN: [id number] Arrival date & time: 07/09/17  1056     History   Chief Complaint Chief Complaint  Patient presents with  . cough/congestion    HPI Allison Chandler is a 27 y.o. female.  HPI   27 year old female presents today with complaints of sore throat and cough times 1 day.  She denies any fever, difficulty swallowing.  Patient reports nasal congestion.  She denies any other complaints here today.  Patient using cough drops and over-the-counter pain medication at home.     Past Medical History:  Diagnosis Date  . Anxiety   . Back pain, chronic   . Bipolar 1 disorder ([place])   . PCOD (polycystic ovarian disease)     There are no active problems to display for this patient.   History reviewed. No pertinent surgical history.  OB History    No data available       Home Medications    Prior to Admission medications   Medication Sig Start Date End Date Taking? Authorizing Provider  acetaminophen (TYLENOL) 500 MG tablet Take 1,000 mg by mouth. Every 6 to 8 hours as needed for pain    [provider]  cephALEXin (KEFLEX) 500 MG capsule Take 2 capsules (1,000 mg total) by mouth 2 (two) times daily. 06/02/17   Charlesetta Shanks, MD  hydrOXYzine (ATARAX/VISTARIL) 25 MG tablet Take 1 tablet (25 mg total) by mouth every 6 (six) hours as needed for itching. 05/02/16   Sam, Olivia Canter, PA-C  ibuprofen (ADVIL,MOTRIN) 200 MG tablet Take 600 mg by mouth. Every 8 hours as needed for pain    [provider]  lithium carbonate 300 MG capsule Take 300 mg by mouth 3 (three) times daily with meals.    [provider]  loperamide (IMODIUM) 2 MG capsule Take 1 capsule (2 mg total) by mouth 4 (four) times daily as needed for diarrhea or loose stools. 06/02/17   Charlesetta Shanks, MD  traZODone (DESYREL) 150 MG tablet Take 150 mg by mouth at bedtime.    [provider]    Family  History Family History  Problem Relation Age of Onset  . Hypertension Mother   . Ulcerative colitis Mother     Social History Social History   Tobacco Use  . Smoking status: Former Smoker    Types: Cigarettes  . Smokeless tobacco: Never Used  Substance Use Topics  . Alcohol use: Yes    Comment: occasionally  . Drug use: No     Allergies   Wellbutrin [bupropion]   Review of Systems Review of Systems  All other systems reviewed and are negative.    Physical Exam Updated Vital Signs BP 140/84   Pulse 78   Temp 98.4 F (36.9 C) (Oral)   Resp 18   SpO2 98%   Physical Exam  Constitutional: She is oriented to person, place, and time. She appears well-developed and well-nourished.  HENT:  Head: Normocephalic and atraumatic.  Mouth/Throat: Uvula is midline and mucous membranes are normal. Posterior oropharyngeal erythema present. No oropharyngeal exudate, posterior oropharyngeal edema or tonsillar abscesses. Tonsils are 1+ on the right. Tonsils are 1+ on the left. No tonsillar exudate.  Eyes: Conjunctivae are normal. Pupils are equal, round, and reactive to light. Right eye exhibits no discharge. Left eye exhibits no discharge. No scleral icterus.  Neck: Normal range of motion. No JVD present. No tracheal deviation present.  Pulmonary/Chest: Effort normal. No stridor.  Neurological: She is alert and oriented to person, place, and time. Coordination normal.  Psychiatric: She has a normal mood and affect. Her behavior is normal. Judgment and thought content normal.  Nursing note and vitals reviewed.    ED Treatments / Results  Labs (all labs ordered are listed, but only abnormal results are displayed) Labs Reviewed  RAPID STREP SCREEN (NOT AT [facility])  CULTURE, GROUP A STREP [facility])    EKG  EKG Interpretation None       Radiology Dg Chest 2 View  Result Date: 07/09/2017 CLINICAL DATA:  Two days of cough and congestion with sore throat. Smoker. EXAM: CHEST  2  VIEW COMPARISON:  08/31/2016. FINDINGS: The heart size and mediastinal contours are within normal limits. Both lungs are clear. The visualized skeletal structures are unremarkable. IMPRESSION: No active cardiopulmonary disease. Electronically Signed   By: Staci Righter M.D.   On: 07/09/2017 14:44    Procedures Procedures (including critical care time)  Medications Ordered in ED Medications - No data to display   Initial Impression / Assessment and Plan / ED Course  I have reviewed the triage vital signs and the nursing notes.  Pertinent labs & imaging results that were available during my care of the patient were reviewed by me and considered in my medical decision making (see chart for details).      Final Clinical Impressions(s) / ED Diagnoses   Final diagnoses:  Viral pharyngitis    Labs: Rapid strep negative  Imaging: DG chest 2 view no acute findings  Consults:  Therapeutics:  Discharge Meds:   Assessment/Plan: 27 year old female presents today with likely viral pharyngitis.  She is well-appearing in no acute distress, no signs of PTA RPA or any significant infection here today.  Symptom medic care instructions given, strict return precautions given.  She verbalized understanding and agreement to today's plan   ED Discharge Orders    None       Francee Gentile 07/09/17 1633    Lajean Saver, MD 07/09/17 2133

## 2017-07-09 NOTE — ED Triage Notes (Signed)
Patient complains of 2 days of cough and congestion with sore throat. Using otc meds with somwe relief, NAD

## 2017-07-11 LAB — CULTURE, GROUP A STREP (THRC)

## 2017-07-30 ENCOUNTER — Ambulatory Visit (HOSPITAL_COMMUNITY)
Admission: EM | Admit: 2017-07-30 | Discharge: 2017-07-30 | Disposition: A | Discharge status: Home / Self Care | Payer: BLUE CROSS/BLUE SHIELD | Attending: Family Medicine | Admitting: Family Medicine

## 2017-07-30 ENCOUNTER — Encounter (HOSPITAL_COMMUNITY): Payer: Self-pay | Admitting: Emergency Medicine

## 2017-07-30 ENCOUNTER — Other Ambulatory Visit: Payer: Self-pay

## 2017-07-30 DIAGNOSIS — R69 Illness, unspecified: Secondary | ICD-10-CM | POA: Diagnosis not present

## 2017-07-30 DIAGNOSIS — J111 Influenza due to unidentified influenza virus with other respiratory manifestations: Secondary | ICD-10-CM

## 2017-07-30 MED ORDER — HYDROCODONE-HOMATROPINE 5-1.5 MG/5ML PO SYRP: 5.0000 mL | ORAL_SOLUTION | Freq: Four times a day (QID) | ORAL | 0 refills | Status: DC | PRN

## 2017-07-30 MED ORDER — OSELTAMIVIR PHOSPHATE 75 MG PO CAPS: 75.0000 mg | ORAL_CAPSULE | Freq: Two times a day (BID) | ORAL | 0 refills | Status: AC

## 2017-07-30 NOTE — ED Provider Notes (Signed)
  [facility]   [id number] 07/30/17 Arrival Time: 7017  ASSESSMENT & PLAN:  1. Influenza-like illness     Meds ordered this encounter  Medications  . HYDROcodone-homatropine (HYCODAN) 5-1.5 MG/5ML syrup    Sig: Take 5 mLs by mouth every 6 (six) hours as needed for cough.    Dispense:  90 mL    Refill:  0  . oseltamivir (TAMIFLU) 75 MG capsule    Sig: Take 1 capsule (75 mg total) by mouth 2 (two) times daily for 5 days.    Dispense:  10 capsule    Refill:  0   Cough medication sedation precautions. Discussed typical duration of symptoms. OTC symptom care as needed. Ensure adequate fluid intake and rest. May f/u with PCP or here as needed.  Reviewed expectations re: course of current medical issues. Questions answered. Outlined signs and symptoms indicating need for more acute intervention. Patient verbalized understanding. After Visit Summary given.   SUBJECTIVE: History from: patient.  Allison Chandler is a 27 y.o. female who presents with complaint of nasal congestion, post-nasal drainage, and a persistent dry cough. Onset abrupt, approximately 1 day ago. Her daughter tested influenza + last week. Overall fatigued with body aches. SOB: none. Wheezing: none. Fever: yes, subjective. Overall normal PO intake without n/v. Sick contacts: yes. OTC treatment: Tylenol with mild help.   Social History   Tobacco Use  Smoking Status Former Smoker  . Types: Cigarettes  Smokeless Tobacco Never Used    ROS: As per HPI.   OBJECTIVE:  Vitals:   07/30/17 1833  BP: (!) 137/93  Pulse: (!) 108  Resp: 18  Temp: 98.3 F (36.8 C)  SpO2: 100%     General appearance: alert; appears fatigued HEENT: nasal congestion; clear runny nose; throat irritation secondary to post-nasal drainage Neck: supple without LAD CV: tachycardic; regular Lungs: unlabored respirations, symmetrical air entry; cough: moderate; no respiratory distress Skin: warm and dry Psychological: alert  and cooperative; normal mood and affect  Imaging: No results found.  Allergies  Allergen Reactions  . Wellbutrin [Bupropion] Rash    Past Medical History:  Diagnosis Date  . Anxiety   . Back pain, chronic   . Bipolar 1 disorder ([place])   . PCOD (polycystic ovarian disease)    Family History  Problem Relation Age of Onset  . Hypertension Mother   . Ulcerative colitis Mother    Social History   Socioeconomic History  . Marital status: Single    Spouse name: Not on file  . Number of children: Not on file  . Years of education: Not on file  . Highest education level: Not on file  Social Needs  . Financial resource strain: Not on file  . Food insecurity - worry: Not on file  . Food insecurity - inability: Not on file  . Transportation needs - medical: Not on file  . Transportation needs - non-medical: Not on file  Occupational History  . Not on file  Tobacco Use  . Smoking status: Former Smoker    Types: Cigarettes  . Smokeless tobacco: Never Used  Substance and Sexual Activity  . Alcohol use: Yes    Comment: occasionally  . Drug use: No  . Sexual activity: Yes    Birth control/protection: IUD  Other Topics Concern  . Not on file  Social History Narrative  . Not on file           Vanessa Kick, MD 08/02/17 1143

## 2017-07-30 NOTE — Discharge Instructions (Signed)

## 2017-07-30 NOTE — ED Triage Notes (Signed)
Per pt, c/o body aches, fever, chills since yesterday. Daughter tested pos for the flu.

## 2017-08-06 ENCOUNTER — Ambulatory Visit: Payer: BLUE CROSS/BLUE SHIELD | Admitting: Nurse Practitioner

## 2017-08-06 ENCOUNTER — Encounter: Payer: Self-pay | Admitting: Nurse Practitioner

## 2017-08-06 VITALS — BP 124/62 | HR 80 | Temp 99.2°F | Resp 18 | Ht 62.0 in | Wt 219.4 lb

## 2017-08-06 DIAGNOSIS — J069 Acute upper respiratory infection, unspecified: Secondary | ICD-10-CM

## 2017-08-06 MED ORDER — CEFDINIR 300 MG PO CAPS: 300.0000 mg | ORAL_CAPSULE | Freq: Two times a day (BID) | ORAL | 0 refills | Status: DC

## 2017-08-06 MED ORDER — CETIRIZINE HCL 10 MG PO TABS: 10.0000 mg | ORAL_TABLET | Freq: Every day | ORAL | 1 refills | Status: DC

## 2017-08-06 MED ORDER — CETIRIZINE HCL 10 MG PO TABS
10.0000 mg | ORAL_TABLET | Freq: Every day | ORAL | 1 refills | Status: DC
Start: 2017-08-06 — End: 2017-08-06

## 2017-08-06 NOTE — Progress Notes (Signed)
Name: Allison Chandler   MRN: [id number]    DOB: 05-15-1991   Date:08/06/2017       Progress Note  Subjective  Chief Complaint  Chief Complaint  Patient presents with  . Cough    congestion, cough, fatigue, was diagnosed with the flu at urgent care last wednesday and finished tamiflu feels like she is getting back sick with a sinus infection    HPI Allison Chandler is coming  In to establish care today. She is requesting to be seen for an acute complaint of URI symptoms today  Congestion- This is an acute problem. This problem began over 1 week ago She was seen at urgent care and diagnosed with influenza based on her symptoms on 2/27. Her daughter actually tested positive for flu right before she got sick She was instructed to use OTC medications for symptoms, rest and stay hydrated. She reports continued chills, sinus pressure, nasal congestion, productive cough with yellow-green sputum, decreased appetite and fatigue. She denies syncope, confusion, rash, sore throat, chest pain, shortness of breath. She stayed out of work this week to rest and get better but she does not feel like she Is improving.  There are no active problems to display for this patient.   Social History   Tobacco Use  . Smoking status: Former Smoker    Types: Cigarettes  . Smokeless tobacco: Never Used  Substance Use Topics  . Alcohol use: Yes    Comment: occasionally     Current Outpatient Medications:  .  acetaminophen (TYLENOL) 500 MG tablet, Take 1,000 mg by mouth. Every 6 to 8 hours as needed for pain, Disp: , Rfl:  .  clonazePAM (KLONOPIN) 0.5 MG tablet, Take 0.5 mg by mouth 2 (two) times daily as needed for anxiety., Disp: , Rfl:  .  Doxepin HCl (SILENOR) 6 MG TABS, Take by mouth., Disp: , Rfl:  .  HYDROcodone-homatropine (HYCODAN) 5-1.5 MG/5ML syrup, Take 5 mLs by mouth every 6 (six) hours as needed for cough., Disp: 90 mL, Rfl: 0 .  Lisdexamfetamine Dimesylate (VYVANSE PO), Take by mouth., Disp: , Rfl:    Allergies  Allergen Reactions  . Wellbutrin [Bupropion] Rash    ROS See HPI  Objective  Vitals:   08/06/17 0926  BP: 124/62  Pulse: 80  Resp: 18  Temp: 99.2 F (37.3 C)  TempSrc: Oral  SpO2: 97%  Weight: 219 lb 6.4 oz (99.5 kg)  Height: 5\' 2"  (1.575 m)   Body mass index is 40.13 kg/m.  Nursing Note and Vital Signs reviewed.  Physical Exam  Constitutional: Patient appears well-developed and well-nourished. Obese. No distress.  HEENT: head atraumatic, normocephalic, pupils equal and reactive to light, EOM's intact, TM's without erythema or bulging, frontal sinus tenderness noted, neck supple without lymphadenopathy, oropharynx pink and moist without exudate Cardiovascular: Normal rate, regular rhythm. No BLE edema. Distal pulses intact. Pulmonary/Chest: Effort normal. No respiratory distress or retractions. Abdominal: Soft and non-tender. Skin: warm and dry. Psychiatric: Patient has a normal mood and affect. behavior is normal.   Assessment & Plan RTC in June for establish care visit to talk about chronic problems  Upper respiratory tract infection, unspecified type Given duration and worsening of symptoms, will treat with antibiotic for possible bacterial infection Also discussed use of zyrtec once daily to help with symptoms.  -Red flags and when to present for emergency care or RTC including fever >101.49F, chest pain, shortness of breath, new/worsening/un-resolving symptoms, reviewed with patient at time of visit. Follow up and  care instructions discussed and provided in AVS. - cetirizine (ZYRTEC) 10 MG tablet; Take 1 tablet (10 mg total) by mouth daily.  Dispense: 30 tablet; Refill: 1 - cefdinir (OMNICEF) 300 MG capsule; Take 1 capsule (300 mg total) by mouth 2 (two) times daily.  Dispense: 20 capsule; Refill: 0

## 2017-08-06 NOTE — Patient Instructions (Addendum)
I have sent a prescription for omnicef antibiotic twice a day for 10 days.  I have sent a prescription for zyrtec once daily to help with congestion and sinus pressure.  Please follow up for fevers over 101, worsening symptoms, or no improvement after z-pak.  It was nice to meet you. Thanks for letting me take care of you today :)   Upper Respiratory Infection, Adult Most upper respiratory infections (URIs) are caused by a virus. A URI affects the nose, throat, and upper air passages. The most common type of URI is often called "the common cold." Follow these instructions at home:  Take medicines only as told by your doctor.  Gargle warm saltwater or take cough drops to comfort your throat as told by your doctor.  Use a warm mist humidifier or inhale steam from a shower to increase air moisture. This may make it easier to breathe.  Drink enough fluid to keep your pee (urine) clear or pale yellow.  Eat soups and other clear broths.  Have a healthy diet.  Rest as needed.  Go back to work when your fever is gone or your doctor says it is okay. ? You may need to stay home longer to avoid giving your URI to others. ? You can also wear a face mask and wash your hands often to prevent spread of the virus.  Use your inhaler more if you have asthma.  Do not use any tobacco products, including cigarettes, chewing tobacco, or electronic cigarettes. If you need help quitting, ask your doctor. Contact a doctor if:  You are getting worse, not better.  Your symptoms are not helped by medicine.  You have chills.  You are getting more short of breath.  You have brown or red mucus.  You have yellow or brown discharge from your nose.  You have pain in your face, especially when you bend forward.  You have a fever.  You have puffy (swollen) neck glands.  You have pain while swallowing.  You have white areas in the back of your throat. Get help right away if:  You have very bad  or constant: ? Headache. ? Ear pain. ? Pain in your forehead, behind your eyes, and over your cheekbones (sinus pain). ? Chest pain.  You have long-lasting (chronic) lung disease and any of the following: ? Wheezing. ? Long-lasting cough. ? Coughing up blood. ? A change in your usual mucus.  You have a stiff neck.  You have changes in your: ? Vision. ? Hearing. ? Thinking. ? Mood. This information is not intended to replace advice given to you by your health care provider. Make sure you discuss any questions you have with your health care provider. Document Released: 11/06/2007 Document Revised: 01/21/2016 Document Reviewed: 08/25/2013 Elsevier Interactive Patient Education  2018 [employer].

## 2017-08-12 ENCOUNTER — Encounter: Payer: Self-pay | Admitting: Family Medicine

## 2017-08-12 ENCOUNTER — Telehealth: Payer: Self-pay | Admitting: Family Medicine

## 2017-08-12 ENCOUNTER — Ambulatory Visit: Payer: BLUE CROSS/BLUE SHIELD | Admitting: Family Medicine

## 2017-08-12 VITALS — BP 118/70 | HR 85 | Temp 98.3°F | Wt 219.1 lb

## 2017-08-12 DIAGNOSIS — J069 Acute upper respiratory infection, unspecified: Secondary | ICD-10-CM | POA: Diagnosis not present

## 2017-08-12 MED ORDER — GUAIFENESIN-CODEINE 100-10 MG/5ML PO SYRP: 5.0000 mL | ORAL_SOLUTION | Freq: Three times a day (TID) | ORAL | 0 refills | Status: DC | PRN

## 2017-08-12 NOTE — Telephone Encounter (Signed)
Pt just in today and saw Dr. Raeford Razor.   She needs the Rx changed to Hydrocodone-homatropine (Hycodan) 5-1.5mg /72ml syrup [id number] [place] [place].   [place].  She has had this medication before and insurance will pay for it.  The guaifenesin-codeine (Robitussin AC) 100-10mg /47ml is not covered under her insurance.  Thanks.

## 2017-08-12 NOTE — Telephone Encounter (Signed)
Copied from [place]. Topic: Quick Communication - See Telephone Encounter >> Aug 12, 2017 12:22 PM Burnis Medin, NT wrote: CRM for notification. See Telephone encounter for: Patient called and said that she was just there and the doctor wrote her a prescription for guaiFENesin-codeine (ROBITUSSIN AC) 100-10 MG/5ML syrup. Patient said that her insurance will not cover this medication and if the doctor could send a prescription for  HYDROcodone-homatropine (HYCODAN) 5-1.5 MG/5ML syrup to [id number] [place] - [place]. AT [place] [phone] (Phone) [phone] (Fax). Pt said she have had this medication before and insurance will pay for it.     08/12/17.

## 2017-08-12 NOTE — Patient Instructions (Signed)
Please try things such as zyrtec-D or allegra-D which is an antihistamine and decongestant.   Please try afrin which will help with nasal congestion but use for only three days.   Please also try using a netti pot on a regular occasion.  Honey can help with a sore throat.   Vick's and Delsym can help with cough.

## 2017-08-12 NOTE — Progress Notes (Signed)
Allison Chandler - 27 y.o. female MRN [id number]  Date of birth: 11-May-1991  SUBJECTIVE:  Including CC & ROS.  Chief Complaint  Patient presents with  . Follow-up    Still not better after 2 weeks; still has lingering cough and nasal drainage (yellow and green mucus)    Allison Chandler is a 27 y.o. female that is presenting with cough. Symptoms have been present for 2 weeks. Has completed antibiotics but hasn't had much improvement. Has been around similar people with similar symptoms. No fever. Has a non productive cough. No asthma. mild improvement with OTC medications.  She ws seen on 3/6 and started on omnicef.    Review of Systems  Constitutional: Positive for activity change. Negative for fever.  HENT: Positive for congestion.   Respiratory: Positive for cough.   Cardiovascular: Negative for chest pain.  Gastrointestinal: Negative for abdominal pain.    HISTORY: Past Medical, Surgical, Social, and Family History Reviewed & Updated per EMR.   Pertinent Historical Findings include:  Past Medical History:  Diagnosis Date  . Anxiety   . Back pain, chronic   . Bipolar 1 disorder ([place])   . PCOD (polycystic ovarian disease)     History reviewed. No pertinent surgical history.  Allergies  Allergen Reactions  . Wellbutrin [Bupropion] Rash    Family History  Problem Relation Age of Onset  . Hypertension Mother   . Ulcerative colitis Mother      Social History   Socioeconomic History  . Marital status: Single    Spouse name: Not on file  . Number of children: Not on file  . Years of education: Not on file  . Highest education level: Not on file  Social Needs  . Financial resource strain: Not on file  . Food insecurity - worry: Not on file  . Food insecurity - inability: Not on file  . Transportation needs - medical: Not on file  . Transportation needs - non-medical: Not on file  Occupational History  . Not on file  Tobacco Use  . Smoking status: Former Smoker      Types: Cigarettes  . Smokeless tobacco: Never Used  Substance and Sexual Activity  . Alcohol use: Yes    Comment: occasionally  . Drug use: No  . Sexual activity: Yes    Birth control/protection: IUD  Other Topics Concern  . Not on file  Social History Narrative  . Not on file     PHYSICAL EXAM:  VS: BP 118/70 (BP Location: Left Arm, Patient Position: Sitting, Cuff Size: Normal)   Pulse 85   Temp 98.3 F (36.8 C)   Wt 219 lb 0.8 oz (99.4 kg)   SpO2 99%   BMI 40.06 kg/m  Physical Exam Gen: NAD, alert, cooperative with exam,  ENT: normal lips, normal nasal mucosa, tympanic membranes clear and intact bilaterally, normal oropharynx, no cervical lymphadenopathy Eye: normal EOM, normal conjunctiva and lids CV:  no edema, +2 pedal pulses, regular rate and rhythm, S1-S2   Resp: no accessory muscle use, non-labored, clear to auscultation bilaterally, no crackles or wheezes Skin: no rashes, no areas of induration  Neuro: normal tone, normal sensation to touch Psych:  normal insight, alert and oriented MSK: Normal gait, normal strength       ASSESSMENT & PLAN:   Upper respiratory tract infection Symptoms are likely viral in nature. Has completed course of antibiotics.  - counseled on supportive care - cough syrup  - given indications to follow up

## 2017-08-12 NOTE — Telephone Encounter (Signed)
Routed message to Dr. Raeford Razor to address request.

## 2017-08-12 NOTE — Telephone Encounter (Signed)
Please advise 

## 2017-08-13 ENCOUNTER — Other Ambulatory Visit: Payer: Self-pay | Admitting: Family Medicine

## 2017-08-13 DIAGNOSIS — J069 Acute upper respiratory infection, unspecified: Secondary | ICD-10-CM | POA: Insufficient documentation

## 2017-08-13 MED ORDER — HYDROCODONE-HOMATROPINE 5-1.5 MG/5ML PO SYRP: 5.0000 mL | ORAL_SOLUTION | Freq: Three times a day (TID) | ORAL | 0 refills | Status: DC | PRN

## 2017-08-13 NOTE — Telephone Encounter (Signed)
Pt called in and the pharmacy cannot accept a fax, must be called in or escribed

## 2017-08-13 NOTE — Telephone Encounter (Signed)
Notified pt I talked to Sam and advised for RX to be resent via e-scribe or phoned in

## 2017-08-13 NOTE — Addendum Note (Signed)
Addended by: Rosemarie Ax on: 08/13/2017 08:32 AM   Modules accepted: Orders

## 2017-08-13 NOTE — Telephone Encounter (Signed)
Left patient a message, prescription was faxed to her pharmacy.

## 2017-08-13 NOTE — Telephone Encounter (Signed)
Faxed hycodan to her pharmacy.   Rosemarie Ax, MD [facility] 08/13/2017, 8:32 AM

## 2017-08-13 NOTE — Assessment & Plan Note (Signed)
Symptoms are likely viral in nature. Has completed course of antibiotics.  - counseled on supportive care - cough syrup  - given indications to follow up

## 2017-08-13 NOTE — Telephone Encounter (Signed)
Patient is calling because she was told that the pharmacy that they never received her prescription for her Hydrocodone-homatrpine

## 2017-08-13 NOTE — Telephone Encounter (Signed)
Copied from [place]. Topic: Quick Communication - See Telephone Encounter >> Aug 12, 2017 12:22 PM Burnis Medin, NT wrote: CRM for notification. See Telephone encounter for: Patient called and said that she was just there and the doctor wrote her a prescription for guaiFENesin-codeine (ROBITUSSIN AC) 100-10 MG/5ML syrup. Patient said that her insurance will not cover this medication and if the doctor could send a prescription for  HYDROcodone-homatropine (HYCODAN) 5-1.5 MG/5ML syrup [id number] [place], [place] - [place]. AT [address] [place] [phone] (Phone) [phone] (Fax). Pt said she have had this medication before and insurance will pay for it.     08/12/17. >> Aug 13, 2017  9:20 AM Cleaster Corin, NT wrote: [facility] just called back and stated that med has to be sent in as a hard copy or electronically. HYDROcodone-homatropine (HYCODAN) 5-1.5 MG/5ML syrup [[id number]]   [id number] [place] [address] [address] [place]  [address] [place] [address]  Phone: [phone] Fax: [phone]

## 2017-08-13 NOTE — Telephone Encounter (Signed)
Patient notified will pick up RX tomorrow at [facility] location.

## 2017-10-14 ENCOUNTER — Other Ambulatory Visit: Payer: Self-pay | Admitting: Family Medicine

## 2017-10-14 ENCOUNTER — Ambulatory Visit
Admission: RE | Admit: 2017-10-14 | Discharge: 2017-10-14 | Disposition: A | Discharge status: Home / Self Care | Payer: BLUE CROSS/BLUE SHIELD | Source: Ambulatory Visit | Attending: Family Medicine | Admitting: Family Medicine

## 2017-10-14 DIAGNOSIS — M545 Low back pain: Secondary | ICD-10-CM

## 2017-10-31 ENCOUNTER — Ambulatory Visit
Admission: RE | Admit: 2017-10-31 | Discharge: 2017-10-31 | Disposition: A | Discharge status: Home / Self Care | Payer: BLUE CROSS/BLUE SHIELD | Source: Ambulatory Visit | Attending: Family Medicine | Admitting: Family Medicine

## 2017-10-31 ENCOUNTER — Other Ambulatory Visit: Payer: Self-pay | Admitting: Family Medicine

## 2017-10-31 DIAGNOSIS — M25569 Pain in unspecified knee: Secondary | ICD-10-CM

## 2017-11-13 ENCOUNTER — Ambulatory Visit: Payer: BLUE CROSS/BLUE SHIELD | Admitting: Nurse Practitioner

## 2017-11-26 ENCOUNTER — Telehealth: Payer: Self-pay | Admitting: *Deleted

## 2017-11-26 ENCOUNTER — Ambulatory Visit: Payer: Self-pay | Admitting: Diagnostic Neuroimaging

## 2017-11-26 NOTE — Telephone Encounter (Signed)
Patient was no show for new patient appointment. It was noted the she had a neurology referral to [facility] on 10/30/17 re: migraines.

## 2017-11-27 ENCOUNTER — Encounter: Payer: Self-pay | Admitting: Diagnostic Neuroimaging

## 2017-12-25 ENCOUNTER — Institutional Professional Consult (permissible substitution): Payer: BLUE CROSS/BLUE SHIELD | Admitting: Neurology

## 2018-04-01 ENCOUNTER — Ambulatory Visit: Payer: BLUE CROSS/BLUE SHIELD | Admitting: Family Medicine

## 2018-04-09 ENCOUNTER — Emergency Department (HOSPITAL_COMMUNITY): Payer: Medicaid Other

## 2018-04-09 ENCOUNTER — Other Ambulatory Visit: Payer: Self-pay

## 2018-04-09 ENCOUNTER — Observation Stay (HOSPITAL_COMMUNITY): Payer: Medicaid Other

## 2018-04-09 ENCOUNTER — Encounter (HOSPITAL_COMMUNITY): Payer: Self-pay | Admitting: *Deleted

## 2018-04-09 ENCOUNTER — Inpatient Hospital Stay (HOSPITAL_COMMUNITY)
Admission: EM | Admit: 2018-04-09 | Discharge: 2018-04-11 | DRG: 872 | Disposition: A | Discharge status: Home / Self Care | Payer: Medicaid Other | Attending: Family Medicine | Admitting: Family Medicine

## 2018-04-09 DIAGNOSIS — F319 Bipolar disorder, unspecified: Secondary | ICD-10-CM | POA: Diagnosis present

## 2018-04-09 DIAGNOSIS — G8929 Other chronic pain: Secondary | ICD-10-CM | POA: Diagnosis present

## 2018-04-09 DIAGNOSIS — Z79899 Other long term (current) drug therapy: Secondary | ICD-10-CM

## 2018-04-09 DIAGNOSIS — G2581 Restless legs syndrome: Secondary | ICD-10-CM | POA: Diagnosis present

## 2018-04-09 DIAGNOSIS — R6510 Systemic inflammatory response syndrome (SIRS) of non-infectious origin without acute organ dysfunction: Secondary | ICD-10-CM | POA: Diagnosis present

## 2018-04-09 DIAGNOSIS — A419 Sepsis, unspecified organism: Principal | ICD-10-CM | POA: Diagnosis present

## 2018-04-09 DIAGNOSIS — F988 Other specified behavioral and emotional disorders with onset usually occurring in childhood and adolescence: Secondary | ICD-10-CM | POA: Diagnosis present

## 2018-04-09 DIAGNOSIS — R509 Fever, unspecified: Secondary | ICD-10-CM

## 2018-04-09 DIAGNOSIS — Z87891 Personal history of nicotine dependence: Secondary | ICD-10-CM

## 2018-04-09 DIAGNOSIS — J069 Acute upper respiratory infection, unspecified: Secondary | ICD-10-CM | POA: Diagnosis present

## 2018-04-09 LAB — CBC WITH DIFFERENTIAL/PLATELET
ABS IMMATURE GRANULOCYTES: 0.22 10*3/uL — AB (ref 0.00–0.07)
BASOS ABS: 0.1 10*3/uL (ref 0.0–0.1)
Basophils Relative: 0 %
EOS ABS: 0 10*3/uL (ref 0.0–0.5)
Eosinophils Relative: 0 %
HEMATOCRIT: 42.5 % (ref 36.0–46.0)
HEMOGLOBIN: 13.5 g/dL (ref 12.0–15.0)
IMMATURE GRANULOCYTES: 1 %
LYMPHS ABS: 2.2 10*3/uL (ref 0.7–4.0)
LYMPHS PCT: 10 %
MCH: 27.7 pg (ref 26.0–34.0)
MCHC: 31.8 g/dL (ref 30.0–36.0)
MCV: 87.1 fL (ref 80.0–100.0)
MONOS PCT: 7 %
Monocytes Absolute: 1.5 10*3/uL — ABNORMAL HIGH (ref 0.1–1.0)
NEUTROS ABS: 17.7 10*3/uL — AB (ref 1.7–7.7)
NEUTROS PCT: 82 %
NRBC: 0 % (ref 0.0–0.2)
Platelets: 268 10*3/uL (ref 150–400)
RBC: 4.88 MIL/uL (ref 3.87–5.11)
RDW: 12.6 % (ref 11.5–15.5)
WBC: 21.7 10*3/uL — ABNORMAL HIGH (ref 4.0–10.5)

## 2018-04-09 LAB — CSF CELL COUNT WITH DIFFERENTIAL
LYMPHS CSF: 76 % (ref 40–80)
Monocyte-Macrophage-Spinal Fluid: 11 % — ABNORMAL LOW (ref 15–45)
RBC COUNT CSF: 30 /mm3 — AB
RBC Count, CSF: 6000 /mm3 — ABNORMAL HIGH
Segmented Neutrophils-CSF: 13 % — ABNORMAL HIGH (ref 0–6)
Tube #: 1
Tube #: 4
WBC CSF: 7 /mm3 — AB (ref 0–5)
WBC CSF: 0 /mm3 (ref 0–5)

## 2018-04-09 LAB — URINALYSIS, ROUTINE W REFLEX MICROSCOPIC
BILIRUBIN URINE: NEGATIVE
Glucose, UA: NEGATIVE mg/dL
KETONES UR: NEGATIVE mg/dL
Leukocytes, UA: NEGATIVE
Nitrite: NEGATIVE
PH: 5 (ref 5.0–8.0)
Protein, ur: NEGATIVE mg/dL
Specific Gravity, Urine: 1.021 (ref 1.005–1.030)

## 2018-04-09 LAB — BASIC METABOLIC PANEL
Anion gap: 12 (ref 5–15)
BUN: 12 mg/dL (ref 6–20)
CALCIUM: 9.5 mg/dL (ref 8.9–10.3)
CO2: 23 mmol/L (ref 22–32)
Chloride: 102 mmol/L (ref 98–111)
Creatinine, Ser: 0.68 mg/dL (ref 0.44–1.00)
GFR calc non Af Amer: >60 mL/min (ref >60)
Glucose, Bld: 124 mg/dL — ABNORMAL HIGH (ref 70–99)
POTASSIUM: 3.7 mmol/L (ref 3.5–5.1)
Sodium: 137 mmol/L (ref 135–145)

## 2018-04-09 LAB — BLOOD GAS, VENOUS
ACID-BASE DEFICIT: 4.8 mmol/L — AB (ref 0.0–2.0)
BICARBONATE: 18.1 mmol/L — AB (ref 20.0–28.0)
O2 Saturation: 96.2 %
PCO2 VEN: 27.7 mmHg — AB (ref 44.0–60.0)
PH VEN: 7.431 — AB (ref 7.250–7.430)
PO2 VEN: 79.2 mmHg — AB (ref 32.0–45.0)
Patient temperature: 98.6

## 2018-04-09 LAB — CG4 I-STAT (LACTIC ACID)
Lactic Acid, Venous: 2.04 mmol/L (ref 0.5–1.9)
Lactic Acid, Venous: 1.28 mmol/L (ref 0.5–1.9)

## 2018-04-09 LAB — INFLUENZA PANEL BY PCR (TYPE A & B)
INFLBPCR: NEGATIVE
Influenza A By PCR: NEGATIVE

## 2018-04-09 LAB — GLUCOSE, CSF: Glucose, CSF: 65 mg/dL (ref 40–70)

## 2018-04-09 LAB — I-STAT BETA HCG BLOOD, ED (NOT ORDERABLE): I-stat hCG, quantitative: <5 m[IU]/mL (ref <5)

## 2018-04-09 LAB — PROTEIN, CSF: Total  Protein, CSF: 26 mg/dL (ref 15–45)

## 2018-04-09 LAB — MRSA PCR SCREENING: MRSA by PCR: NEGATIVE

## 2018-04-09 LAB — GROUP A STREP BY PCR: Group A Strep by PCR: NOT DETECTED

## 2018-04-09 MED ORDER — VARENICLINE TARTRATE 1 MG PO TABS
1.0000 mg | ORAL_TABLET | Freq: Two times a day (BID) | ORAL | Status: DC
  Administered 2018-04-09 – 2018-04-10 (×4): 1 mg via ORAL
  Filled 2018-04-09 (×5): qty 1

## 2018-04-09 MED ORDER — VANCOMYCIN HCL 10 G IV SOLR
2000.0000 mg | Freq: Once | INTRAVENOUS | Status: AC
  Administered 2018-04-09: 2000 mg via INTRAVENOUS
  Filled 2018-04-09: qty 2000

## 2018-04-09 MED ORDER — SODIUM CHLORIDE 0.9 % IV SOLN
2.0000 g | Freq: Two times a day (BID) | INTRAVENOUS | Status: DC
  Administered 2018-04-09 – 2018-04-10 (×3): 2 g via INTRAVENOUS
  Filled 2018-04-09 (×3): qty 20
  Filled 2018-04-09: qty 2

## 2018-04-09 MED ORDER — SODIUM CHLORIDE 0.9 % IV SOLN
2.0000 g | Freq: Two times a day (BID) | INTRAVENOUS | Status: DC
  Filled 2018-04-09: qty 20

## 2018-04-09 MED ORDER — VALACYCLOVIR HCL 500 MG PO TABS: 500.0000 mg | ORAL_TABLET | Freq: Three times a day (TID) | ORAL | Status: DC

## 2018-04-09 MED ORDER — ONDANSETRON HCL 4 MG/2ML IJ SOLN
4.0000 mg | Freq: Four times a day (QID) | INTRAMUSCULAR | Status: DC | PRN
  Administered 2018-04-09: 4 mg via INTRAVENOUS
  Filled 2018-04-09: qty 2

## 2018-04-09 MED ORDER — BUSPIRONE HCL 5 MG PO TABS
5.0000 mg | ORAL_TABLET | Freq: Three times a day (TID) | ORAL | Status: DC
  Administered 2018-04-09 – 2018-04-10 (×6): 5 mg via ORAL
  Filled 2018-04-09 (×6): qty 1

## 2018-04-09 MED ORDER — LIDOCAINE HCL 1 % IJ SOLN
INTRAMUSCULAR | Status: AC
  Filled 2018-04-09: qty 20

## 2018-04-09 MED ORDER — ALBUTEROL SULFATE (2.5 MG/3ML) 0.083% IN NEBU: 5.0000 mg | INHALATION_SOLUTION | Freq: Once | RESPIRATORY_TRACT | Status: DC

## 2018-04-09 MED ORDER — SODIUM CHLORIDE 0.9 % IV SOLN
5.0000 mg/kg | Freq: Two times a day (BID) | INTRAVENOUS | Status: DC
  Filled 2018-04-09 (×2): qty 7.2

## 2018-04-09 MED ORDER — CYCLOBENZAPRINE HCL 10 MG PO TABS
10.0000 mg | ORAL_TABLET | Freq: Three times a day (TID) | ORAL | Status: DC | PRN
  Administered 2018-04-09 – 2018-04-10 (×4): 10 mg via ORAL
  Filled 2018-04-09 (×4): qty 1

## 2018-04-09 MED ORDER — SODIUM CHLORIDE 0.9 % IV SOLN
2.0000 g | INTRAVENOUS | Status: AC
  Administered 2018-04-09: 2 g via INTRAVENOUS
  Filled 2018-04-09: qty 20

## 2018-04-09 MED ORDER — ACETAMINOPHEN 325 MG PO TABS
650.0000 mg | ORAL_TABLET | Freq: Four times a day (QID) | ORAL | Status: DC | PRN
  Administered 2018-04-09 – 2018-04-10 (×4): 650 mg via ORAL
  Filled 2018-04-09 (×4): qty 2

## 2018-04-09 MED ORDER — SODIUM CHLORIDE 0.9 % IV SOLN
2.0000 g | Freq: Once | INTRAVENOUS | Status: AC
  Administered 2018-04-09: 2 g via INTRAVENOUS
  Filled 2018-04-09: qty 2

## 2018-04-09 MED ORDER — VANCOMYCIN HCL IN DEXTROSE 750-5 MG/150ML-% IV SOLN
750.0000 mg | Freq: Three times a day (TID) | INTRAVENOUS | Status: DC
  Filled 2018-04-09: qty 150

## 2018-04-09 MED ORDER — CARIPRAZINE HCL 1.5 MG PO CAPS
3.0000 mg | ORAL_CAPSULE | Freq: Every day | ORAL | Status: DC
  Administered 2018-04-10: 3 mg via ORAL
  Filled 2018-04-09 (×2): qty 2

## 2018-04-09 MED ORDER — ACETAMINOPHEN 650 MG RE SUPP: 650.0000 mg | Freq: Four times a day (QID) | RECTAL | Status: DC | PRN

## 2018-04-09 MED ORDER — DEXTROSE 5 % IV SOLN
720.0000 mg | INTRAVENOUS | Status: AC
  Administered 2018-04-09: 720 mg via INTRAVENOUS
  Filled 2018-04-09: qty 14.4

## 2018-04-09 MED ORDER — VALACYCLOVIR HCL 500 MG PO TABS
500.0000 mg | ORAL_TABLET | Freq: Three times a day (TID) | ORAL | Status: DC
  Administered 2018-04-09 – 2018-04-10 (×3): 500 mg via ORAL
  Filled 2018-04-09 (×4): qty 1

## 2018-04-09 MED ORDER — IBUPROFEN 200 MG PO TABS
400.0000 mg | ORAL_TABLET | Freq: Four times a day (QID) | ORAL | Status: DC | PRN
  Administered 2018-04-09 – 2018-04-11 (×4): 400 mg via ORAL
  Filled 2018-04-09 (×4): qty 2

## 2018-04-09 MED ORDER — VANCOMYCIN HCL 10 G IV SOLR: 1750.0000 mg | INTRAVENOUS | Status: DC

## 2018-04-09 MED ORDER — SODIUM CHLORIDE 0.9 % IV SOLN
INTRAVENOUS | Status: AC
  Administered 2018-04-09 (×3): via INTRAVENOUS

## 2018-04-09 MED ORDER — ACETAMINOPHEN 325 MG PO TABS
650.0000 mg | ORAL_TABLET | Freq: Once | ORAL | Status: AC
  Administered 2018-04-09: 650 mg via ORAL
  Filled 2018-04-09: qty 2

## 2018-04-09 MED ORDER — ONDANSETRON HCL 4 MG PO TABS: 4.0000 mg | ORAL_TABLET | Freq: Four times a day (QID) | ORAL | Status: DC | PRN

## 2018-04-09 MED ORDER — CARIPRAZINE HCL 3 MG PO CAPS
3.0000 mg | ORAL_CAPSULE | ORAL | Status: AC
  Administered 2018-04-10: 3 mg via ORAL
  Filled 2018-04-09 (×2): qty 1

## 2018-04-09 MED ORDER — ATOMOXETINE HCL 40 MG PO CAPS
40.0000 mg | ORAL_CAPSULE | Freq: Every day | ORAL | Status: DC
  Administered 2018-04-09 – 2018-04-10 (×2): 40 mg via ORAL
  Filled 2018-04-09 (×3): qty 1

## 2018-04-09 MED ORDER — VARENICLINE TARTRATE 0.5 MG X 11 & 1 MG X 42 PO MISC: 1.0000 | ORAL | Status: DC

## 2018-04-09 MED ORDER — ROPINIROLE HCL 0.25 MG PO TABS
0.2500 mg | ORAL_TABLET | Freq: Every day | ORAL | Status: DC | PRN
  Filled 2018-04-09: qty 1

## 2018-04-09 MED ORDER — SODIUM CHLORIDE 0.9 % IV BOLUS
1000.0000 mL | Freq: Once | INTRAVENOUS | Status: AC
  Administered 2018-04-09: 1000 mL via INTRAVENOUS

## 2018-04-09 MED ORDER — LIDOCAINE HCL (PF) 1 % IJ SOLN
INTRAMUSCULAR | Status: AC
  Filled 2018-04-09: qty 30

## 2018-04-09 MED ORDER — HYDROXYZINE HCL 25 MG PO TABS
25.0000 mg | ORAL_TABLET | Freq: Three times a day (TID) | ORAL | Status: DC | PRN
  Administered 2018-04-09 – 2018-04-10 (×3): 25 mg via ORAL
  Filled 2018-04-09 (×3): qty 1

## 2018-04-09 MED ORDER — GABAPENTIN 400 MG PO CAPS
400.0000 mg | ORAL_CAPSULE | Freq: Three times a day (TID) | ORAL | Status: DC
  Administered 2018-04-09 – 2018-04-10 (×6): 400 mg via ORAL
  Filled 2018-04-09 (×6): qty 1

## 2018-04-09 NOTE — ED Notes (Signed)
Requested urine from patient. 

## 2018-04-09 NOTE — H&P (Signed)
History and Physical    Allison Chandler QIO:[id number] DOB: November 17, 1990 DOA: 04/09/2018  PCP: Patient, No Pcp Per  Patient coming from: Home.  Chief Complaint: Fever chills sore throat headache.  HPI: Allison Chandler is a 27 y.o. female with history of bipolar disorder, ADD and restless leg syndrome who quit smoking 2 weeks ago and is on Chantix presents to the ER with sudden onset of fever chills headache sore throat since last evening.  Denies neck pain has some photophobia.  Denies any shortness of breath nausea vomiting chest pain abdominal pain diarrhea or dysuria.  Denies any recent travel or sick contacts.  ED Course: In the ER patient is febrile tachycardic but not hypotensive.  Chest x-ray UA unremarkable.  Strep throat is negative influenza PCR is negative.  Blood cultures were obtained.  On exam patient does not have neck rigidity but complains of significant headache with photophobia.  Review of Systems: As per HPI, rest all negative.   Past Medical History:  Diagnosis Date  . Anxiety   . Back pain, chronic   . Bipolar 1 disorder ([place])   . PCOD (polycystic ovarian disease)     History reviewed. No pertinent surgical history.   reports that she has quit smoking. Her smoking use included cigarettes. She has never used smokeless tobacco. She reports that she drinks alcohol. She reports that she does not use drugs.  Allergies  Allergen Reactions  . Wellbutrin [Bupropion] Rash    Family History  Problem Relation Age of Onset  . Hypertension Mother   . Ulcerative colitis Mother     Prior to Admission medications   Medication Sig Start Date End Date Taking? Authorizing Provider  acetaminophen (TYLENOL) 500 MG tablet Take 1,000 mg by mouth. Every 6 to 8 hours as needed for pain   Yes [provider]  atomoxetine (STRATTERA) 40 MG capsule Take 40 mg by mouth daily. 01/14/18  Yes [provider]  busPIRone (BUSPAR) 5 MG tablet Take 5 mg by mouth 3 (three)  times daily. 02/09/18  Yes [provider]  cariprazine (VRAYLAR) capsule Take 3 mg by mouth daily. 01/14/18  Yes [provider]  CHANTIX STARTING MONTH PAK 0.5 MG X 11 & 1 MG X 42 tablet Take 1 tablet by mouth as directed. 03/09/18  Yes [provider]  gabapentin (NEURONTIN) 400 MG capsule Take 400 mg by mouth 3 (three) times daily. 01/14/18  Yes [provider]  hydrOXYzine (ATARAX/VISTARIL) 25 MG tablet Take 25 mg by mouth 3 (three) times daily as needed for anxiety. 01/14/18  Yes [provider]  rOPINIRole (REQUIP) 0.25 MG tablet Take 0.25 mg by mouth daily as needed (restless legs).   Yes [provider]    Physical Exam: Vitals:   04/09/18 0154 04/09/18 0202 04/09/18 0225 04/09/18 0300  BP: (!) 146/94   (!) 143/86  Pulse: (!) 142 (!) 129  (!) 128  Resp: (!) 44 (!) 30  (!) 33  Temp: 98.9 F (37.2 C)  (!) 102 F (38.9 C)   TempSrc: Oral  Rectal   SpO2: 100%   100%  Weight: 103.9 kg     Height: 5\' 2"  (1.575 m)         Constitutional: Moderately built and nourished. Vitals:   04/09/18 0154 04/09/18 0202 04/09/18 0225 04/09/18 0300  BP: (!) 146/94   (!) 143/86  Pulse: (!) 142 (!) 129  (!) 128  Resp: (!) 44 (!) 30  (!) 33  Temp:  98.9 F (37.2 C)  (!) 102 F (38.9 C)   TempSrc: Oral  Rectal   SpO2: 100%   100%  Weight: 103.9 kg     Height: 5\' 2"  (1.575 m)      Eyes: Anicteric no pallor. ENMT: No discharge from the ears eyes nose or mouth. Neck: No neck rigidity no mass felt.  No JVD appreciated. Respiratory: No rhonchi or crepitations. Cardiovascular: S1-S2 heard no murmurs appreciated. Abdomen: Soft nontender bowel sounds present. Musculoskeletal: No edema.  No joint effusion. Skin: No rash. Neurologic: Alert awake oriented to time place and person.  Moves all extremities. Psychiatric: Appears normal per normal affect.   Labs on Admission: I have personally reviewed following labs and imaging studies  CBC: Recent  Labs  Lab 04/09/18 0300  WBC 21.7*  NEUTROABS 17.7*  HGB 13.5  HCT 42.5  MCV 87.1  PLT 623   Basic Metabolic Panel: Recent Labs  Lab 04/09/18 0300  NA 137  K 3.7  CL 102  CO2 23  GLUCOSE 124*  BUN 12  CREATININE 0.68  CALCIUM 9.5   GFR: Estimated Creatinine Clearance: 119.4 mL/min (by C-G formula based on SCr of 0.68 mg/dL). Liver Function Tests: No results for input(s): AST, ALT, ALKPHOS, BILITOT, PROT, ALBUMIN in the last 168 hours. No results for input(s): LIPASE, AMYLASE in the last 168 hours. No results for input(s): AMMONIA in the last 168 hours. Coagulation Profile: No results for input(s): INR, PROTIME in the last 168 hours. Cardiac Enzymes: No results for input(s): CKTOTAL, CKMB, CKMBINDEX, TROPONINI in the last 168 hours. BNP (last 3 results) No results for input(s): PROBNP in the last 8760 hours. HbA1C: No results for input(s): HGBA1C in the last 72 hours. CBG: No results for input(s): GLUCAP in the last 168 hours. Lipid Profile: No results for input(s): CHOL, HDL, LDLCALC, TRIG, CHOLHDL, LDLDIRECT in the last 72 hours. Thyroid Function Tests: No results for input(s): TSH, T4TOTAL, FREET4, T3FREE, THYROIDAB in the last 72 hours. Anemia Panel: No results for input(s): VITAMINB12, FOLATE, FERRITIN, TIBC, IRON, RETICCTPCT in the last 72 hours. Urine analysis:    Component Value Date/Time   COLORURINE YELLOW 04/09/2018 0301   APPEARANCEUR CLEAR 04/09/2018 0301   LABSPEC 1.021 04/09/2018 0301   PHURINE 5.0 04/09/2018 0301   GLUCOSEU NEGATIVE 04/09/2018 0301   HGBUR MODERATE (A) 04/09/2018 0301   BILIRUBINUR NEGATIVE 04/09/2018 0301   KETONESUR NEGATIVE 04/09/2018 0301   PROTEINUR NEGATIVE 04/09/2018 0301   NITRITE NEGATIVE 04/09/2018 0301   LEUKOCYTESUR NEGATIVE 04/09/2018 0301   Sepsis Labs: @LABRCNTIP (procalcitonin:4,lacticidven:4) ) Recent Results (from the past 240 hour(s))  Group A Strep by PCR     Status: None   Collection Time: 04/09/18   2:35 AM  Result Value Ref Range Status   Group A Strep by PCR NOT DETECTED NOT DETECTED Final    Comment: Performed at [facility], [place] [address] [place], [place] [address]  Blood culture (routine x 2)     Status: None (Preliminary result)   Collection Time: 04/09/18  3:01 AM  Result Value Ref Range Status   Specimen Description   Final    BLOOD LEFT FOREARM Performed at [facility], [place] [address] [place], [place] [address]    Special Requests   Final    BOTTLES DRAWN AEROBIC AND ANAEROBIC Blood Culture adequate volume Performed at [facility] [address], [place], [place] [address]    Culture PENDING  Incomplete   Report  Status PENDING  Incomplete     Radiological Exams on Admission: Dg Chest 2 View  Result Date: 04/09/2018 CLINICAL DATA:  Shortness of breath, chest congestion, weakness for past week, much worse tonight EXAM: CHEST - 2 VIEW COMPARISON:  07/09/2017 FINDINGS: The heart size and mediastinal contours are within normal limits. Both lungs are clear. The visualized skeletal structures are unremarkable. IMPRESSION: No active cardiopulmonary disease. Electronically Signed   By: Lucienne Capers M.D.   On: 04/09/2018 02:26    EKG: Independently reviewed.  Sinus tachycardia.  Assessment/Plan Principal Problem:   SIRS (systemic inflammatory response syndrome) (HCC) Active Problems:   Bipolar disorder (HCC)   ADD (attention deficit disorder)    1. SIRS -source not clear.  Since patient has headache I have requested Dr. Florina Ou ER physician about lumbar puncture.  For now we will cover with empiric antibiotics for meningitis with vancomycin and ceftriaxone and acyclovir.  Follow cultures.  Continue with hydration. 2. Bipolar disorder we will continue present home medications including Vraylar, BuSpar. 3. ADD on Strattera. 4. Restless leg syndrome on ropinirole. 5. Chronic pain on gabapentin. 6. Tobacco abuse recently  quit on Chantix.   DVT prophylaxis: SCDs in anticipation of lumbar puncture. Code Status: Full code. Family Communication: Discussed with patient. Disposition Plan: Home. Consults called: None. Admission status: Observation   Rise Patience MD [facility] Pager [phone].  If 7PM-7AM, please contact night-coverage [email or web address] Password [facility]  04/09/2018, 6:53 AM

## 2018-04-09 NOTE — Progress Notes (Signed)
Patient ID: Allison Chandler, female   DOB: 07-04-1990, 27 y.o.   MRN: [id number]  Fluoroscopic guided lumbar puncture performed at L4-5 using a 20g spinal needle.  OP 27 cm H20.  13 cc clear, colorless CSF withdrawn and sent to the lab.  The patient tolerated the procedure well with no immediate complications.

## 2018-04-09 NOTE — Progress Notes (Signed)
PROGRESS NOTE    Allison Chandler  DGU:[id number] DOB: 1990/06/24 DOA: 04/09/2018 PCP: Patient, No Pcp Per      Brief Narrative:  Allison Chandler is a 27 y.o. F with Bipolar and ADD who presented with few weeks cough, then (the night of admission) new acute chills, malaise, generalized weakness, nasal congestion and sore throat.  In ER had Fever 102, HR 142, RR 44, and lactic acid 2.1, but negative CXR, clear urine, negative flu.  Started on empiric antibiotics for meningitis due to headache, admitted to ICU.     Assessment & Plan:  Sepsis syndrome Presented with fever 102F,  HR 142, RR 44, and lactic acid >2, however, no clear localizing source other than viral URI symptoms, urinary frequency.  No abdominal pain, or abdominal rigidity.  No nuchal rigidity, LP without WBC.  2.1, but negative CXR, clear urine, negative flu.  Started on empiric antibiotics for meningitis due to photophobia/headache.    LP today with negative gram stain, normal protein, glucose, 6000 RBC/7 WBC in first tube, 30 RBC/0 WBC in tube 4 without xanthocrhomia, strongly suggestive of traumatic tap.   -Stop vancomycin -Continue ceftriaxone for possible UTI sepsis or abcteremia -Continue antiviral as Valtrex (discussed with ID by phone, Dr. Johnnye Sima) until HSV PCR returns -Follow HSV PCR, CSF culture, blood and urine cultures -Continue IV fluids  Mood disorder -Continue atomoxetine -Continue Buspar, cariprazine, gabapentin  Smoking -Continue varenicline       DVT prophylaxis: SCDs Code Status: FULL Family Communication: None present MDM and disposition Plan: This is a no charge note.  For further details, please see H&P by my partner Dr. Hal Hope from earlier today.  The below labs and imaging reports were reviewed and summarized above.    The patient was admitted with sepsis syndrome, unclear source.      Objective: Vitals:   04/09/18 1000 04/09/18 1015 04/09/18 1030 04/09/18 1231  BP: 127/63  115/78  (!) 148/91  Pulse: (!) 115 (!) 112 (!) 114 (!) 117  Resp: (!) 33 (!) 29 17   Temp:    98.8 F (37.1 C)  TempSrc:    Oral  SpO2: 97% 100% 100% 100%  Weight:    103.4 kg  Height:    5\' 2"  (1.575 m)    Intake/Output Summary (Last 24 hours) at 04/09/2018 1439 Last data filed at 04/09/2018 1015 Gross per 24 hour  Intake 2850 ml  Output -  Net 2850 ml   Filed Weights   04/09/18 0154 04/09/18 1231  Weight: 103.9 kg 103.4 kg    Examination: The patient was seen and examined.      Data Reviewed: I have personally reviewed following labs and imaging studies:  CBC: Recent Labs  Lab 04/09/18 0300  WBC 21.7*  NEUTROABS 17.7*  HGB 13.5  HCT 42.5  MCV 87.1  PLT 956   Basic Metabolic Panel: Recent Labs  Lab 04/09/18 0300  NA 137  K 3.7  CL 102  CO2 23  GLUCOSE 124*  BUN 12  CREATININE 0.68  CALCIUM 9.5   GFR: Estimated Creatinine Clearance: 119.1 mL/min (by C-G formula based on SCr of 0.68 mg/dL). Liver Function Tests: No results for input(s): AST, ALT, ALKPHOS, BILITOT, PROT, ALBUMIN in the last 168 hours. No results for input(s): LIPASE, AMYLASE in the last 168 hours. No results for input(s): AMMONIA in the last 168 hours. Coagulation Profile: No results for input(s): INR, PROTIME in the last 168 hours. Cardiac Enzymes: No results for input(s): CKTOTAL,  CKMB, CKMBINDEX, TROPONINI in the last 168 hours. BNP (last 3 results) No results for input(s): PROBNP in the last 8760 hours. HbA1C: No results for input(s): HGBA1C in the last 72 hours. CBG: No results for input(s): GLUCAP in the last 168 hours. Lipid Profile: No results for input(s): CHOL, HDL, LDLCALC, TRIG, CHOLHDL, LDLDIRECT in the last 72 hours. Thyroid Function Tests: No results for input(s): TSH, T4TOTAL, FREET4, T3FREE, THYROIDAB in the last 72 hours. Anemia Panel: No results for input(s): VITAMINB12, FOLATE, FERRITIN, TIBC, IRON, RETICCTPCT in the last 72 hours. Urine analysis:    Component  Value Date/Time   COLORURINE YELLOW 04/09/2018 0301   APPEARANCEUR CLEAR 04/09/2018 0301   LABSPEC 1.021 04/09/2018 0301   PHURINE 5.0 04/09/2018 0301   GLUCOSEU NEGATIVE 04/09/2018 0301   HGBUR MODERATE (A) 04/09/2018 0301   BILIRUBINUR NEGATIVE 04/09/2018 0301   KETONESUR NEGATIVE 04/09/2018 0301   PROTEINUR NEGATIVE 04/09/2018 0301   NITRITE NEGATIVE 04/09/2018 0301   LEUKOCYTESUR NEGATIVE 04/09/2018 0301   Sepsis Labs: @LABRCNTIP (procalcitonin:4,lacticacidven:4)  ) Recent Results (from the past 240 hour(s))  Group A Strep by PCR     Status: None   Collection Time: 04/09/18  2:35 AM  Result Value Ref Range Status   Group A Strep by PCR NOT DETECTED NOT DETECTED Final    Comment: Performed at [facility], [place] [address] [place], [place] [address]  Blood culture (routine x 2)     Status: None (Preliminary result)   Collection Time: 04/09/18  3:01 AM  Result Value Ref Range Status   Specimen Description   Final    BLOOD LEFT FOREARM Performed at [facility], [place] [address] [place], [place] [address]    Special Requests   Final    BOTTLES DRAWN AEROBIC AND ANAEROBIC Blood Culture adequate volume Performed at [facility] [address] [place], [place] [address]    Culture PENDING  Incomplete   Report Status PENDING  Incomplete  CSF culture     Status: None (Preliminary result)   Collection Time: 04/09/18 11:04 AM  Result Value Ref Range Status   Specimen Description CSF  Final   Special Requests NONE  Final   Gram Stain   Final    NO WBC SEEN NO ORGANISMS SEEN Gram Stain Report Called to,Read Back By and Verified With: [facility] @ 2025 ON [id number] BY POTEAT,S Performed at [facility], [place] [address], [place], [place] [address]    Culture PENDING  Incomplete   Report Status PENDING  Incomplete  MRSA PCR Screening     Status: None   Collection Time: 04/09/18 12:26 PM  Result Value Ref Range Status    MRSA by PCR NEGATIVE NEGATIVE Final    Comment:        The GeneXpert MRSA Assay (FDA approved for NASAL specimens only), is one component of a comprehensive MRSA colonization surveillance program. It is not intended to diagnose MRSA infection nor to guide or monitor treatment for MRSA infections. Performed at [facility], [place] [address], [place], [place] [address]          Radiology Studies: Dg Chest 2 View  Result Date: 04/09/2018 CLINICAL DATA:  Shortness of breath, chest congestion, weakness for past week, much worse tonight EXAM: CHEST - 2 VIEW COMPARISON:  07/09/2017 FINDINGS: The heart size and mediastinal contours are within normal limits. Both lungs are clear. The visualized skeletal structures are unremarkable. IMPRESSION: No active cardiopulmonary disease.  Electronically Signed   By: Lucienne Capers M.D.   On: 04/09/2018 02:26   Ct Head Wo Contrast  Result Date: 04/09/2018 CLINICAL DATA:  Altered level of consciousness EXAM: CT HEAD WITHOUT CONTRAST TECHNIQUE: Contiguous axial images were obtained from the base of the skull through the vertex without intravenous contrast. COMPARISON:  03/28/2017 FINDINGS: Brain: No evidence of acute infarction, hemorrhage, hydrocephalus, or intra-axial masslike finding. There is known epidermoid the right CP angle cistern which is seen as a low-density causing mild mass effect on the right middle cerebellar peduncle. Dimensions are 15 x 21 mm, similar to prior when remeasured in a similar fashion. Vascular: Negative Skull: Negative Sinuses/Orbits: Negative IMPRESSION: No acute finding. 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 Mild mass-effect is stable from 2018 MRI. Electronically Signed   By: Monte Fantasia M.D.   On: 04/09/2018 10:07   Dg Fluoro Guided Loc Of Needle/cath Tip For Spinal Inject Lt  Result Date: 04/09/2018 CLINICAL DATA:  Fever, headache, nausea. EXAM: DIAGNOSTIC LUMBAR PUNCTURE UNDER FLUOROSCOPIC  GUIDANCE FLUOROSCOPY TIME:  Fluoroscopy Time:  0 minutes 27 seconds Radiation Exposure Index (if provided by the fluoroscopic device): 4.70 mGy Number of Acquired Spot Images: 0 PROCEDURE: Informed consent was obtained from the patient prior to the procedure, including potential complications of headache, allergy, and pain. With the patient prone, the lower back was prepped with Betadine. 1% Lidocaine was used for local anesthesia. Lumbar puncture was performed at the L4-5 level using a 20 gauge needle with return of clear, colorless CSF with an opening pressure of 27 cm water. 13 ml of CSF were obtained for laboratory studies. The patient tolerated the procedure well and there were no apparent complications. IMPRESSION: Successful fluoroscopic guided lumbar puncture. Electronically Signed   By: Claudie Revering M.D.   On: 04/09/2018 11:57        Scheduled Meds: . atomoxetine  40 mg Oral Daily  . busPIRone  5 mg Oral TID  . cariprazine  3 mg Oral Daily  . gabapentin  400 mg Oral TID  . lidocaine (PF)      . lidocaine      . varenicline  1 mg Oral BID   Continuous Infusions: . sodium chloride 150 mL/hr at 04/09/18 1324  . cefTRIAXone (ROCEPHIN)  IV    . ganciclovir (CYTOVENE) IV    . vancomycin       LOS: 0 days    Time spent: 25 minutes    Edwin Dada, MD [facility] 04/09/2018, 2:39 PM     Pager [phone] --- please page though AMION:  [email or web address] Password [email or web address] If 7PM-7AM, please contact night-coverage

## 2018-04-09 NOTE — ED Provider Notes (Addendum)
[facility] DEPT Provider Note: Georgena Spurling, MD, FACEP  CSN: [id number] MRN: [id number] ARRIVAL: 04/09/18 at [place]: [place] Body Aches and Sore Throat   HISTORY OF PRESENT ILLNESS  04/09/18 2:17 AM Allison Chandler is a 27 y.o. female who complains of chills, generalized body aches and weakness since yesterday.  She states the onset was fairly rapid.  On arrival here she was noted to be tachycardic, febrile and tachypneic.  She did not initially complain of shortness of breath but acknowledges that she feels short of breath.  She has a mild sore throat and a sensation of mucus in her throat.  She denies nausea, vomiting or diarrhea.  She states she has been urinating frequently but denies dysuria.   Past Medical History:  Diagnosis Date  . Anxiety   . Back pain, chronic   . Bipolar 1 disorder ([place])   . PCOD (polycystic ovarian disease)     History reviewed. No pertinent surgical history.  Family History  Problem Relation Age of Onset  . Hypertension Mother   . Ulcerative colitis Mother     Social History   Tobacco Use  . Smoking status: Former Smoker    Types: Cigarettes  . Smokeless tobacco: Never Used  Substance Use Topics  . Alcohol use: Yes    Comment: occasionally  . Drug use: No    Prior to Admission medications   Medication Sig Start Date End Date Taking? Authorizing Provider  acetaminophen (TYLENOL) 500 MG tablet Take 1,000 mg by mouth. Every 6 to 8 hours as needed for pain   Yes [provider]  atomoxetine (STRATTERA) 40 MG capsule Take 40 mg by mouth daily. 01/14/18  Yes [provider]  busPIRone (BUSPAR) 5 MG tablet Take 5 mg by mouth 3 (three) times daily. 02/09/18  Yes [provider]  cariprazine (VRAYLAR) capsule Take 3 mg by mouth daily. 01/14/18  Yes [provider]  CHANTIX STARTING MONTH PAK 0.5 MG X 11 & 1 MG X 42 tablet Take 1 tablet by mouth as directed. 03/09/18  Yes  [provider]  gabapentin (NEURONTIN) 400 MG capsule Take 400 mg by mouth 3 (three) times daily. 01/14/18  Yes [provider]  hydrOXYzine (ATARAX/VISTARIL) 25 MG tablet Take 25 mg by mouth 3 (three) times daily as needed for anxiety. 01/14/18  Yes [provider]  rOPINIRole (REQUIP) 0.25 MG tablet Take 0.25 mg by mouth daily as needed (restless legs).   Yes [provider]    Allergies Wellbutrin [bupropion]   REVIEW OF SYSTEMS  Negative except as noted here or in the History of Present Illness.   PHYSICAL EXAMINATION  Initial Vital Signs Blood pressure (!) 146/94, pulse (!) 129, temperature 98.9 F (37.2 C), temperature source Oral, resp. rate (!) 30, height 5\' 2"  (1.575 m), weight 103.9 kg, SpO2 100 %.  Examination General: Well-developed, well-nourished female in no acute distress; appearance consistent with age of record HENT: normocephalic; atraumatic; pharyngeal edema without exudate Eyes: pupils equal, round and reactive to light; extraocular muscles intact Neck: supple; negative Brudzinski's sign; negative Kernig sign Heart: regular rate and rhythm; tachycardia Lungs: clear to auscultation bilaterally Abdomen: soft; nondistended; mild diffuse tenderness; no masses or hepatosplenomegaly; bowel sounds present Extremities: No deformity; full range of motion; pulses normal Neurologic: Awake, alert and oriented; motor function intact in all extremities and symmetric; no facial droop Skin: Warm and dry Psychiatric: Flat affect   RESULTS  Summary of  this visit's results, reviewed by myself:   EKG Interpretation  Date/Time:  Thursday April 09 2018 02:03:24 EST Ventricular Rate:  131 PR Interval:    QRS Duration: 79 QT Interval:  262 QTC Calculation: 387 R Axis:   68 Text Interpretation:  Sinus tachycardia Borderline repolarization abnormality Rate is faster Confirmed by Amandy Chubbuck, Jenny Reichmann [phone]) on 04/09/2018 2:10:43 AM       Laboratory Studies: Results for orders placed or performed during the hospital encounter of 04/09/18 (from the past 24 hour(s))  Group A Strep by PCR     Status: None   Collection Time: 04/09/18  2:35 AM  Result Value Ref Range   Group A Strep by PCR NOT DETECTED NOT DETECTED  CBC with Differential/Platelet     Status: Abnormal   Collection Time: 04/09/18  3:00 AM  Result Value Ref Range   WBC 21.7 (H) 4.0 - 10.5 K/uL   RBC 4.88 3.87 - 5.11 MIL/uL   Hemoglobin 13.5 12.0 - 15.0 g/dL   HCT 42.5 36.0 - 46.0 %   MCV 87.1 80.0 - 100.0 fL   MCH 27.7 26.0 - 34.0 pg   MCHC 31.8 30.0 - 36.0 g/dL   RDW 12.6 11.5 - 15.5 %   Platelets 268 150 - 400 K/uL   nRBC 0.0 0.0 - 0.2 %   Neutrophils Relative % 82 %   Neutro Abs 17.7 (H) 1.7 - 7.7 K/uL   Lymphocytes Relative 10 %   Lymphs Abs 2.2 0.7 - 4.0 K/uL   Monocytes Relative 7 %   Monocytes Absolute 1.5 (H) 0.1 - 1.0 K/uL   Eosinophils Relative 0 %   Eosinophils Absolute 0.0 0.0 - 0.5 K/uL   Basophils Relative 0 %   Basophils Absolute 0.1 0.0 - 0.1 K/uL   Immature Granulocytes 1 %   Abs Immature Granulocytes 0.22 (H) 0.00 - 0.07 K/uL  Basic metabolic panel     Status: Abnormal   Collection Time: 04/09/18  3:00 AM  Result Value Ref Range   Sodium 137 135 - 145 mmol/L   Potassium 3.7 3.5 - 5.1 mmol/L   Chloride 102 98 - 111 mmol/L   CO2 23 22 - 32 mmol/L   Glucose, Bld 124 (H) 70 - 99 mg/dL   BUN 12 6 - 20 mg/dL   Creatinine, Ser 0.68 0.44 - 1.00 mg/dL   Calcium 9.5 8.9 - 10.3 mg/dL   GFR calc non Af Amer >60 >60 mL/min   GFR calc Af Amer >60 >60 mL/min   Anion gap 12 5 - 15  Blood culture (routine x 2)     Status: None (Preliminary result)   Collection Time: 04/09/18  3:01 AM  Result Value Ref Range   Specimen Description      BLOOD LEFT FOREARM Performed at [facility], [address], [place], [place] [address]    Special Requests      BOTTLES DRAWN AEROBIC AND ANAEROBIC Blood Culture adequate volume Performed at  [facility] [address] [place], [place] [address]    Culture PENDING    Report Status PENDING   Urinalysis, Routine w reflex microscopic     Status: Abnormal   Collection Time: 04/09/18  3:01 AM  Result Value Ref Range   Color, Urine YELLOW YELLOW   APPearance CLEAR CLEAR   Specific Gravity, Urine 1.021 1.005 - 1.030   pH 5.0 5.0 - 8.0   Glucose, UA NEGATIVE NEGATIVE mg/dL   Hgb urine  dipstick MODERATE (A) NEGATIVE   Bilirubin Urine NEGATIVE NEGATIVE   Ketones, ur NEGATIVE NEGATIVE mg/dL   Protein, ur NEGATIVE NEGATIVE mg/dL   Nitrite NEGATIVE NEGATIVE   Leukocytes, UA NEGATIVE NEGATIVE   RBC / HPF 0-5 0 - 5 RBC/hpf   WBC, UA 0-5 0 - 5 WBC/hpf   Bacteria, UA RARE (A) NONE SEEN   Squamous Epithelial / LPF 0-5 0 - 5   Mucus PRESENT   I-Stat beta hCG blood, ED     Status: None   Collection Time: 04/09/18  3:11 AM  Result Value Ref Range   I-stat hCG, quantitative <5.0 <5 mIU/mL   Comment 3          CG4 I-STAT (Lactic acid)     Status: Abnormal   Collection Time: 04/09/18  3:15 AM  Result Value Ref Range   Lactic Acid, Venous 2.04 (HH) 0.5 - 1.9 mmol/L   Comment NOTIFIED PHYSICIAN   Blood gas, venous     Status: Abnormal   Collection Time: 04/09/18  5:10 AM  Result Value Ref Range   pH, Ven 7.431 (H) 7.250 - 7.430   pCO2, Ven 27.7 (L) 44.0 - 60.0 mmHg   pO2, Ven 79.2 (H) 32.0 - 45.0 mmHg   Bicarbonate 18.1 (L) 20.0 - 28.0 mmol/L   Acid-base deficit 4.8 (H) 0.0 - 2.0 mmol/L   O2 Saturation 96.2 %   Patient temperature 98.6    Collection site VEIN    Drawn by DRAWN BY RN    Sample type VEIN   CG4 I-STAT (Lactic acid)     Status: None   Collection Time: 04/09/18  5:45 AM  Result Value Ref Range   Lactic Acid, Venous 1.28 0.5 - 1.9 mmol/L   Imaging Studies: Dg Chest 2 View  Result Date: 04/09/2018 CLINICAL DATA:  Shortness of breath, chest congestion, weakness for past week, much worse tonight EXAM: CHEST - 2 VIEW COMPARISON:  07/09/2017  FINDINGS: The heart size and mediastinal contours are within normal limits. Both lungs are clear. The visualized skeletal structures are unremarkable. IMPRESSION: No active cardiopulmonary disease. Electronically Signed   By: Lucienne Capers M.D.   On: 04/09/2018 02:26    ED COURSE and MDM  Nursing notes and initial vitals signs, including pulse oximetry, reviewed.  Vitals:   04/09/18 0154 04/09/18 0202 04/09/18 0225 04/09/18 0300  BP: (!) 146/94   (!) 143/86  Pulse: (!) 142 (!) 129  (!) 128  Resp: (!) 44 (!) 30  (!) 33  Temp: 98.9 F (37.2 C)  (!) 102 F (38.9 C)   TempSrc: Oral  Rectal   SpO2: 100%   100%  Weight: 103.9 kg     Height: 5\' 2"  (1.575 m)      2:36 AM Sepsis protocol initiated.  Blood cultures obtained, IV fluid bolus initiated.  5:29 AM Cefepime ordered for possible bacterial etiology.  Strep negative.  No obvious bacterial source.  Hospitalist to admit.   7:13 AM Hospitalist requested lumbar puncture because patient has a headache.  LP attempted unsuccessfully.  Will defer to radiology.   PROCEDURES   Lumbar puncture (see separate note).  CRITICAL CARE Performed by: Karen Chafe Benjy Kana Total critical care time: 30 minutes Critical care time was exclusive of separately billable procedures and treating other patients. Critical care was necessary to treat or prevent imminent or life-threatening deterioration. Critical care was time spent personally by me on the following activities: development of treatment plan with patient and/or surrogate  as well as nursing, discussions with consultants, evaluation of patient's response to treatment, examination of patient, obtaining history from patient or surrogate, ordering and performing treatments and interventions, ordering and review of laboratory studies, ordering and review of radiographic studies, pulse oximetry and re-evaluation of patient's condition.   ED DIAGNOSES     ICD-10-CM   1. SIRS (systemic inflammatory  response syndrome) (HCC) R65.10   2. Febrile illness R50.9        Pasqual Farias, MD 04/09/18 9983    Shanon Rosser, MD 04/09/18 [phone]

## 2018-04-09 NOTE — ED Provider Notes (Signed)
  [place] DEPT Provider Note   CSN: [id number]  .Lumbar Puncture Date/Time: 04/09/2018 7:09 AM Performed by: Esbeidy Mclaine, MD Authorized by: Huong Luthi, MD   Consent:    Consent obtained:  Verbal   Consent given by:  Patient   Risks discussed:  Bleeding, headache, pain and repeat procedure   Alternatives discussed:  No treatment Pre-procedure details:    Procedure purpose:  Diagnostic   Preparation: Patient was prepped and draped in usual sterile fashion   Anesthesia (see MAR for exact dosages):    Anesthesia method:  Local infiltration   Local anesthetic:  Lidocaine 1% w/o epi Procedure details:    Lumbar space:  L4-L5 interspace   Patient position:  Sitting   Needle gauge:  20   Needle length (in):  3.5   Ultrasound guidance: no     Number of attempts:  3 Post-procedure:    Puncture site:  Adhesive bandage applied Comments:     Unable to obtain cerebrospinal fluid.  Will defer to radiology for fluoroscopic attempt.        Shanon Rosser, MD 04/09/18 [phone]

## 2018-04-09 NOTE — ED Notes (Signed)
ED TO INPATIENT HANDOFF REPORT  Name/Age/Gender Allison Chandler 27 y.o. female  Code Status    Code Status Orders  (From admission, onward)         Start     Ordered   04/09/18 0652  Full code  Continuous     04/09/18 0652        Code Status History    This patient has a current code status but no historical code status.      Home/SNF/Other Home  Chief Complaint Generalized Body Aches; Sore Throat   Level of Care/Admitting Diagnosis ED Disposition    ED Disposition Condition Comment   Admit  Hospital Area: [facility] [[id number]]  Level of Care: Stepdown [14]  Admit to SDU based on following criteria: Severe physiological/psychological symptoms:  Any diagnosis requiring assessment & intervention at least every 4 hours on an ongoing basis to obtain desired patient outcomes including stability and rehabilitation  Diagnosis: SIRS (systemic inflammatory response syndrome) [facility]) [158309]  Admitting Physician: Rise Patience [phone]  Attending Physician: Rise Patience Lei.Right  PT Class (Do Not Modify): Observation [104]  PT Acc Code (Do Not Modify): Observation [10022]       Medical History Past Medical History:  Diagnosis Date  . Anxiety   . Back pain, chronic   . Bipolar 1 disorder ([place])   . PCOD (polycystic ovarian disease)     Allergies Allergies  Allergen Reactions  . Wellbutrin [Bupropion] Rash    IV Location/Drains/Wounds Patient Lines/Drains/Airways Status   Active Line/Drains/Airways    Name:   Placement date:   Placement time:   Site:   Days:   Peripheral IV 04/09/18 Right Antecubital   04/09/18    0259    Antecubital   less than 1   Peripheral IV 04/09/18 Right Forearm   04/09/18    0310    Forearm   less than 1          Labs/Imaging Results for orders placed or performed during the hospital encounter of 04/09/18 (from the past 48 hour(s))  Group A Strep by PCR     Status: None   Collection Time: 04/09/18  2:35  AM  Result Value Ref Range   Group A Strep by PCR NOT DETECTED NOT DETECTED    Comment: Performed at [facility], [place] [address], [place], [place] [address]  CBC with Differential/Platelet     Status: Abnormal   Collection Time: 04/09/18  3:00 AM  Result Value Ref Range   WBC 21.7 (H) 4.0 - 10.5 K/uL   RBC 4.88 3.87 - 5.11 MIL/uL   Hemoglobin 13.5 12.0 - 15.0 g/dL   HCT 42.5 36.0 - 46.0 %   MCV 87.1 80.0 - 100.0 fL   MCH 27.7 26.0 - 34.0 pg   MCHC 31.8 30.0 - 36.0 g/dL   RDW 12.6 11.5 - 15.5 %   Platelets 268 150 - 400 K/uL   nRBC 0.0 0.0 - 0.2 %   Neutrophils Relative % 82 %   Neutro Abs 17.7 (H) 1.7 - 7.7 K/uL   Lymphocytes Relative 10 %   Lymphs Abs 2.2 0.7 - 4.0 K/uL   Monocytes Relative 7 %   Monocytes Absolute 1.5 (H) 0.1 - 1.0 K/uL   Eosinophils Relative 0 %   Eosinophils Absolute 0.0 0.0 - 0.5 K/uL   Basophils Relative 0 %   Basophils Absolute 0.1 0.0 - 0.1 K/uL   Immature Granulocytes 1 %  Abs Immature Granulocytes 0.22 (H) 0.00 - 0.07 K/uL    Comment: Performed at [facility], [place] [address], [place], [place] [address]  Basic metabolic panel     Status: Abnormal   Collection Time: 04/09/18  3:00 AM  Result Value Ref Range   Sodium 137 135 - 145 mmol/L   Potassium 3.7 3.5 - 5.1 mmol/L   Chloride 102 98 - 111 mmol/L   CO2 23 22 - 32 mmol/L   Glucose, Bld 124 (H) 70 - 99 mg/dL   BUN 12 6 - 20 mg/dL   Creatinine, Ser 0.68 0.44 - 1.00 mg/dL   Calcium 9.5 8.9 - 10.3 mg/dL   GFR calc non Af Amer >60 >60 mL/min   GFR calc Af Amer >60 >60 mL/min    Comment: (NOTE) The eGFR has been calculated using the CKD EPI equation. This calculation has not been validated in all clinical situations. eGFR's persistently <60 mL/min signify possible Chronic Kidney Disease.    Anion gap 12 5 - 15    Comment: Performed at [facility], [place] [address] [place], [place] [address]  Blood culture (routine x 2)     Status: None  (Preliminary result)   Collection Time: 04/09/18  3:01 AM  Result Value Ref Range   Specimen Description      BLOOD LEFT FOREARM Performed at [facility], [place] [address] [place], [place] [address]    Special Requests      BOTTLES DRAWN AEROBIC AND ANAEROBIC Blood Culture adequate volume Performed at [facility] [address] [place], [place] [address]    Culture PENDING    Report Status PENDING   Urinalysis, Routine w reflex microscopic     Status: Abnormal   Collection Time: 04/09/18  3:01 AM  Result Value Ref Range   Color, Urine YELLOW YELLOW   APPearance CLEAR CLEAR   Specific Gravity, Urine 1.021 1.005 - 1.030   pH 5.0 5.0 - 8.0   Glucose, UA NEGATIVE NEGATIVE mg/dL   Hgb urine dipstick MODERATE (A) NEGATIVE   Bilirubin Urine NEGATIVE NEGATIVE   Ketones, ur NEGATIVE NEGATIVE mg/dL   Protein, ur NEGATIVE NEGATIVE mg/dL   Nitrite NEGATIVE NEGATIVE   Leukocytes, UA NEGATIVE NEGATIVE   RBC / HPF 0-5 0 - 5 RBC/hpf   WBC, UA 0-5 0 - 5 WBC/hpf   Bacteria, UA RARE (A) NONE SEEN   Squamous Epithelial / LPF 0-5 0 - 5   Mucus PRESENT     Comment: Performed at [facility], [place] [address], [place], [place] [address]  I-Stat beta hCG blood, ED     Status: None   Collection Time: 04/09/18  3:11 AM  Result Value Ref Range   I-stat hCG, quantitative <5.0 <5 mIU/mL   Comment 3            Comment:   GEST. AGE      CONC.  (mIU/mL)   <=1 WEEK        5 - 50     2 WEEKS       50 - 500     3 WEEKS       100 - 10,000     4 WEEKS     1,000 - 30,000        FEMALE AND NON-PREGNANT FEMALE:     LESS THAN 5 mIU/mL   CG4 I-STAT (Lactic acid)     Status: Abnormal  Collection Time: 04/09/18  3:15 AM  Result Value Ref Range   Lactic Acid, Venous 2.04 (HH) 0.5 - 1.9 mmol/L   Comment NOTIFIED PHYSICIAN   Blood gas, venous     Status: Abnormal   Collection Time: 04/09/18  5:10 AM  Result Value Ref Range   pH, Ven 7.431 (H) 7.250 - 7.430   pCO2,  Ven 27.7 (L) 44.0 - 60.0 mmHg   pO2, Ven 79.2 (H) 32.0 - 45.0 mmHg   Bicarbonate 18.1 (L) 20.0 - 28.0 mmol/L   Acid-base deficit 4.8 (H) 0.0 - 2.0 mmol/L   O2 Saturation 96.2 %   Patient temperature 98.6    Collection site VEIN    Drawn by DRAWN BY RN    Sample type VEIN     Comment: Performed at [facility] [address], [place], [place] [address]  Influenza panel by PCR (type A & B)     Status: None   Collection Time: 04/09/18  5:17 AM  Result Value Ref Range   Influenza A By PCR NEGATIVE NEGATIVE   Influenza B By PCR NEGATIVE NEGATIVE    Comment: (NOTE) The Xpert Xpress Flu assay is intended as an aid in the diagnosis of  influenza and should not be used as a sole basis for treatment.  This  assay is FDA approved for nasopharyngeal swab specimens only. Nasal  washings and aspirates are unacceptable for Xpert Xpress Flu testing. Performed at [facility], [place] [address] [place], [place] [address]   CG4 I-STAT (Lactic acid)     Status: None   Collection Time: 04/09/18  5:45 AM  Result Value Ref Range   Lactic Acid, Venous 1.28 0.5 - 1.9 mmol/L   Dg Chest 2 View  Result Date: 04/09/2018 CLINICAL DATA:  Shortness of breath, chest congestion, weakness for past week, much worse tonight EXAM: CHEST - 2 VIEW COMPARISON:  07/09/2017 FINDINGS: The heart size and mediastinal contours are within normal limits. Both lungs are clear. The visualized skeletal structures are unremarkable. IMPRESSION: No active cardiopulmonary disease. Electronically Signed   By: Lucienne Capers M.D.   On: 04/09/2018 02:26   Ct Head Wo Contrast  Result Date: 04/09/2018 CLINICAL DATA:  Altered level of consciousness EXAM: CT HEAD WITHOUT CONTRAST TECHNIQUE: Contiguous axial images were obtained from the base of the skull through the vertex without intravenous contrast. COMPARISON:  03/28/2017 FINDINGS: Brain: No evidence of acute infarction, hemorrhage, hydrocephalus, or  intra-axial masslike finding. There is known epidermoid the right CP angle cistern which is seen as a low-density causing mild mass effect on the right middle cerebellar peduncle. Dimensions are 15 x 21 mm, similar to prior when remeasured in a similar fashion. Vascular: Negative Skull: Negative Sinuses/Orbits: Negative IMPRESSION: No acute finding. 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 Mild mass-effect is stable from 2018 MRI. Electronically Signed   By: Monte Fantasia M.D.   On: 04/09/2018 10:07   Dg Fluoro Guided Loc Of Needle/cath Tip For Spinal Inject Lt  Result Date: 04/09/2018 CLINICAL DATA:  Fever, headache, nausea. EXAM: DIAGNOSTIC LUMBAR PUNCTURE UNDER FLUOROSCOPIC GUIDANCE FLUOROSCOPY TIME:  Fluoroscopy Time:  0 minutes 27 seconds Radiation Exposure Index (if provided by the fluoroscopic device): 4.70 mGy Number of Acquired Spot Images: 0 PROCEDURE: Informed consent was obtained from the patient prior to the procedure, including potential complications of headache, allergy, and pain. With the patient prone, the lower back was prepped with Betadine. 1% Lidocaine was used for local  anesthesia. Lumbar puncture was performed at the L4-5 level using a 20 gauge needle with return of clear, colorless CSF with an opening pressure of 27 cm water. 13 ml of CSF were obtained for laboratory studies. The patient tolerated the procedure well and there were no apparent complications. IMPRESSION: Successful fluoroscopic guided lumbar puncture. Electronically Signed   By: Claudie Revering M.D.   On: 04/09/2018 11:57    Pending Labs Unresulted Labs (From admission, onward)    Start     Ordered   04/10/18 7858  Basic metabolic panel  Tomorrow morning,   R     04/09/18 0652   04/10/18 0500  CBC  Tomorrow morning,   R     04/09/18 0652   04/09/18 1151  Glucose, CSF  R     04/09/18 1151   04/09/18 1151  Protein, CSF  R     04/09/18 1151   04/09/18 1151  CSF cell count with differential  R     04/09/18  1151   04/09/18 1151  CSF culture  R     04/09/18 1151   04/09/18 1151  Herpes simplex virus (HSV), DNA by PCR Cerebrospinal Fluid  R     04/09/18 1151   04/09/18 1006  Herpes simplex virus (HSV), DNA by PCR Cerebrospinal Fluid  Once,   R     04/09/18 1005   04/09/18 0719  CSF cell count with differential collection tube #: 1  STAT,   STAT    Question Answer Comment  collection tube # 1   Are there also cytology or pathology orders on this specimen? No      04/09/18 0719   04/09/18 0719  CSF cell count with differential collection tube #: 4  STAT,   STAT    Question Answer Comment  collection tube # 4   Are there also cytology or pathology orders on this specimen? No      04/09/18 0719   04/09/18 0719  Protein and glucose, CSF  Once,   STAT     04/09/18 0719   04/09/18 0719  CSF culture with Stat gram stain  Once,   R    Question Answer Comment  Are there also cytology or pathology orders on this specimen? No   Patient immune status Normal      04/09/18 0719   04/09/18 0230  Blood culture (routine x 2)  BLOOD CULTURE X 2,   STAT     04/09/18 0229          Vitals/Pain Today's Vitals   04/09/18 0830 04/09/18 1000 04/09/18 1015 04/09/18 1030  BP: 113/73 127/63  115/78  Pulse: (!) 121 (!) 115 (!) 112 (!) 114  Resp: 17 (!) 33 (!) 29 17  Temp:      TempSrc:      SpO2: 99% 97% 100% 100%  Weight:      Height:      PainSc:        Isolation Precautions No active isolations  Medications Medications  atomoxetine (STRATTERA) capsule 40 mg (40 mg Oral Given 04/09/18 1046)  busPIRone (BUSPAR) tablet 5 mg (5 mg Oral Given 04/09/18 1045)  cariprazine (VRAYLAR) capsule 3 mg (3 mg Oral Refused 04/09/18 1100)  hydrOXYzine (ATARAX/VISTARIL) tablet 25 mg (25 mg Oral Given 04/09/18 0735)  gabapentin (NEURONTIN) capsule 400 mg (400 mg Oral Given 04/09/18 1046)  rOPINIRole (REQUIP) tablet 0.25 mg (has no administration in time range)  acetaminophen (TYLENOL) tablet 650 mg (650  mg Oral  Given 04/09/18 0735)    Or  acetaminophen (TYLENOL) suppository 650 mg ( Rectal See Alternative 04/09/18 0735)  ondansetron (ZOFRAN) tablet 4 mg ( Oral See Alternative 04/09/18 1050)    Or  ondansetron (ZOFRAN) injection 4 mg (4 mg Intravenous Given 04/09/18 1050)  0.9 %  sodium chloride infusion ( Intravenous New Bag/Given 04/09/18 0750)  cefTRIAXone (ROCEPHIN) 2 g in sodium chloride 0.9 % 100 mL IVPB (has no administration in time range)  lidocaine (XYLOCAINE) 1 % (with pres) injection (has no administration in time range)  lidocaine (PF) (XYLOCAINE) 1 % injection (has no administration in time range)  vancomycin (VANCOCIN) IVPB 750 mg/150 ml premix (has no administration in time range)  varenicline (CHANTIX) tablet 1 mg (1 mg Oral Given 04/09/18 1047)  ganciclovir (CYTOVENE) 360 mg in sodium chloride 0.9 % 100 mL IVPB (has no administration in time range)  sodium chloride 0.9 % bolus 1,000 mL (0 mLs Intravenous Stopped 04/09/18 0519)  acetaminophen (TYLENOL) tablet 650 mg (650 mg Oral Given 04/09/18 0310)  sodium chloride 0.9 % bolus 1,000 mL (0 mLs Intravenous Stopped 04/09/18 0656)  ceFEPIme (MAXIPIME) 2 g in sodium chloride 0.9 % 100 mL IVPB (0 g Intravenous Stopped 04/09/18 0558)  cefTRIAXone (ROCEPHIN) 2 g in sodium chloride 0.9 % 100 mL IVPB (0 g Intravenous Stopped 04/09/18 1015)  vancomycin (VANCOCIN) 2,000 mg in sodium chloride 0.9 % 500 mL IVPB (0 mg Intravenous Stopped 04/09/18 0952)  acyclovir (ZOVIRAX) 720 mg in dextrose 5 % 150 mL IVPB (0 mg Intravenous Stopped 04/09/18 0849)    Mobility walks

## 2018-04-09 NOTE — ED Notes (Signed)
Patient transported to CT 

## 2018-04-09 NOTE — ED Notes (Signed)
Pt transported to IR for LP.

## 2018-04-09 NOTE — Progress Notes (Signed)
Pharmacy Antibiotic Note  Allison Chandler is a 27 y.o. female admitted on 04/09/2018 with possible meningitis.  Pharmacy has been consulted for Vancomycin and ganciclovir dosing.   Plan: Ceftriaxone 2gm iv q12hr  Acyclovir 720mg  (ABW) iv x1- followed by ganciclovir 5 mg/kg (ABW) IV q12h   Vancomycin 2gm iv x1, then 750mg  iv q8hr  Goal AUC = 400 - 500 for all indications, except meningitis (goal AUC > 500 and Cmin 15-20 mcg/mL)  Expected AUC = 568 Cmax = 32.7 Cmin = 18  Height: 5\' 2"  (157.5 cm) Weight: 229 lb (103.9 kg) IBW/kg (Calculated) : 50.1  Temp (24hrs), Avg:100.5 F (38.1 C), Min:98.9 F (37.2 C), Max:102 F (38.9 C)  Recent Labs  Lab 04/09/18 0300 04/09/18 0315 04/09/18 0545  WBC 21.7*  --   --   CREATININE 0.68  --   --   LATICACIDVEN  --  2.04* 1.28    Estimated Creatinine Clearance: 119.4 mL/min (by C-G formula based on SCr of 0.68 mg/dL).    Allergies  Allergen Reactions  . Wellbutrin [Bupropion] Rash    Antimicrobials this admission: Vancomycin 11/7 >> Ceftriaxone 11/7 >>  Acyclovir 11/7 x1 Ganciclovir 11/7 >>    Dose adjustments this admission: -  Microbiology results: -  Thank you for allowing pharmacy to be a part of this patient's care.  Ulice Dash D 04/09/2018 11:52 AM

## 2018-04-09 NOTE — ED Notes (Signed)
Patient ambulated to restroom independently.

## 2018-04-09 NOTE — ED Triage Notes (Signed)
Pt reports generalized body aches with sore throat and SOB.  Denies any fever.  Reports non-productive cough.

## 2018-04-09 NOTE — Progress Notes (Signed)
Pharmacy Antibiotic Note  Allison Chandler is a 27 y.o. female admitted on 04/09/2018 with meningitis.  Pharmacy has been consulted for Vancomycin, acyclovir dosing.  Plan: Ceftriaxone 2gm iv q12hr Acyclovir 720mg  (ABW) iv x1--ID will need to address agent choice going forward due to acyclovir shortage.  Vancomycin 2gm iv x1, then 750mg  iv q8hr  Goal AUC = 400 - 500 for all indications, except meningitis (goal AUC > 500 and Cmin 15-20 mcg/mL)  Expected AUC = 568 Cmax = 32.7 Cmin = 18  Height: 5\' 2"  (157.5 cm) Weight: 229 lb (103.9 kg) IBW/kg (Calculated) : 50.1  Temp (24hrs), Avg:100.5 F (38.1 C), Min:98.9 F (37.2 C), Max:102 F (38.9 C)  Recent Labs  Lab 04/09/18 0300 04/09/18 0315 04/09/18 0545  WBC 21.7*  --   --   CREATININE 0.68  --   --   LATICACIDVEN  --  2.04* 1.28    Estimated Creatinine Clearance: 119.4 mL/min (by C-G formula based on SCr of 0.68 mg/dL).    Allergies  Allergen Reactions  . Wellbutrin [Bupropion] Rash    Antimicrobials this admission: Vancomycin 04/09/2018 >> Ceftriaxone 04/09/2018 >>  Acyclovir 04/09/2018 x1    Dose adjustments this admission: -  Microbiology results: -  Thank you for allowing pharmacy to be a part of this patient's care.  Nani Skillern Crowford 04/09/2018 7:21 AM

## 2018-04-10 DIAGNOSIS — A419 Sepsis, unspecified organism: Secondary | ICD-10-CM | POA: Diagnosis present

## 2018-04-10 DIAGNOSIS — Z87891 Personal history of nicotine dependence: Secondary | ICD-10-CM | POA: Diagnosis not present

## 2018-04-10 DIAGNOSIS — R6510 Systemic inflammatory response syndrome (SIRS) of non-infectious origin without acute organ dysfunction: Secondary | ICD-10-CM | POA: Diagnosis not present

## 2018-04-10 DIAGNOSIS — R509 Fever, unspecified: Secondary | ICD-10-CM | POA: Diagnosis present

## 2018-04-10 DIAGNOSIS — F319 Bipolar disorder, unspecified: Secondary | ICD-10-CM | POA: Diagnosis present

## 2018-04-10 DIAGNOSIS — J069 Acute upper respiratory infection, unspecified: Secondary | ICD-10-CM | POA: Diagnosis present

## 2018-04-10 DIAGNOSIS — F988 Other specified behavioral and emotional disorders with onset usually occurring in childhood and adolescence: Secondary | ICD-10-CM

## 2018-04-10 DIAGNOSIS — Z79899 Other long term (current) drug therapy: Secondary | ICD-10-CM | POA: Diagnosis not present

## 2018-04-10 DIAGNOSIS — G8929 Other chronic pain: Secondary | ICD-10-CM | POA: Diagnosis present

## 2018-04-10 DIAGNOSIS — G2581 Restless legs syndrome: Secondary | ICD-10-CM | POA: Diagnosis present

## 2018-04-10 LAB — CBC
HCT: 38.8 % (ref 36.0–46.0)
HEMOGLOBIN: 12.2 g/dL (ref 12.0–15.0)
MCH: 28.3 pg (ref 26.0–34.0)
MCHC: 31.4 g/dL (ref 30.0–36.0)
MCV: 90 fL (ref 80.0–100.0)
Platelets: 215 10*3/uL (ref 150–400)
RBC: 4.31 MIL/uL (ref 3.87–5.11)
RDW: 13.1 % (ref 11.5–15.5)
WBC: 13.8 10*3/uL — ABNORMAL HIGH (ref 4.0–10.5)
nRBC: 0 % (ref 0.0–0.2)

## 2018-04-10 LAB — HERPES SIMPLEX VIRUS(HSV) DNA BY PCR
HSV 1 DNA: NEGATIVE
HSV 2 DNA: NEGATIVE

## 2018-04-10 LAB — BASIC METABOLIC PANEL
Anion gap: 6 (ref 5–15)
BUN: 7 mg/dL (ref 6–20)
CO2: 26 mmol/L (ref 22–32)
Calcium: 8.7 mg/dL — ABNORMAL LOW (ref 8.9–10.3)
Chloride: 108 mmol/L (ref 98–111)
Creatinine, Ser: 0.57 mg/dL (ref 0.44–1.00)
GFR calc Af Amer: >60 mL/min (ref >60)
GLUCOSE: 131 mg/dL — AB (ref 70–99)
POTASSIUM: 4 mmol/L (ref 3.5–5.1)
Sodium: 140 mmol/L (ref 135–145)

## 2018-04-10 MED ORDER — VALACYCLOVIR HCL 500 MG PO TABS
1000.0000 mg | ORAL_TABLET | Freq: Three times a day (TID) | ORAL | Status: DC
  Administered 2018-04-10 (×2): 1000 mg via ORAL
  Filled 2018-04-10 (×4): qty 2

## 2018-04-10 NOTE — Progress Notes (Signed)
PROGRESS NOTE    Allison Chandler  YIA:[id number] DOB: July 24, 1990 DOA: 04/09/2018 PCP: Patient, No Pcp Per      Brief Narrative:  Allison Chandler is a 27 y.o. F with Bipolar and ADD who presented with few weeks cough, then (the night of admission) new acute chills, malaise, generalized weakness, nasal congestion and sore throat.  In ER had Fever 102, HR 142, RR 44, and lactic acid 2.1, but negative CXR, clear urine, negative flu.  Started on empiric antibiotics for meningitis due to headache, admitted to ICU.     Assessment & Plan:  Sepsis syndrome Presented with fever 102F,  HR 142, RR 44, and lactic acid >2, however, no clear localizing source yet other than viral URI symptoms.  LP is also reviewed, showed traumatic tap.   SpO2 normal. -Continue ceftriaxone -Continue Valtrex -Follow HSV PCR, CSF culture, blood and urine cultures   Tachycardia Telemetry reviewed, sinus tachycardia  Mood disorder -Continue atomoxetine, BuSpar, gabapentin, and cariprazine  Smoking -Continue varenicline       DVT prophylaxis: SCDs Code Status: FULL Family Communication: None present MDM and disposition Plan: The below labs and imaging studies were reviewed and summarized above.  Medication management as above.  The patient was admitted with fever, tachycardia, and elevated lactic acid.  She has a severe and potentially life-threatening acute illness.  High number of possible diagnostic considerations, medication management, review of telemetry and pulse oximetry were done, summarized above.  Due to the extremity of her hemodynamic instability at presentation (heart rate greater than 140, lactic acid greater than 2), and her clinical appearance having a severe enough appearance to warrant lumbar puncture and initiation of empiric treatment for meningitis, it is felt to high risk at this time to de-escalate antibiotics further and monitor off antibiotics or discharge from the hospital without further  culture data at 48 hours and HSV PCR.  Continue inpatient management, IV antibiotics.      Objective: Vitals:   04/10/18 1000 04/10/18 1100 04/10/18 1200 04/10/18 1300  BP: 133/62  (!) 125/92   Pulse: 86 93 (!) 107 (!) 109  Resp: 15 17 19 17   Temp:   97.6 F (36.4 C)   TempSrc:   Oral   SpO2: 99% 98% 99% 99%  Weight:      Height:        Intake/Output Summary (Last 24 hours) at 04/10/2018 1321 Last data filed at 04/10/2018 1200 Gross per 24 hour  Intake 2605 ml  Output 2300 ml  Net 305 ml   Filed Weights   04/09/18 0154 04/09/18 1231  Weight: 103.9 kg 103.4 kg    Examination:   General: Diaphoretic, tired, but interactive, no acute distress.    HEENT: Corneas clear, conjunctive and lids and lashes normal, no icterus, visual tracking smooth, oropharynx with some posterior erythema but no exudates.  No nasal discharge, epistaxis.  However she sounds very congested.  Dentition, lips normal.  Hearing normal. Cardiac: Tachycardic, regular, no murmurs, JVP not visible, no lower extremity edema. Respiratory: Normal respiratory rate and rhythm, lungs clear without rales or wheezes Abdomen: No tenderness to palpation or rebound.  Abdomen soft.  No distention, ascites, hepatosplenomegaly. Extremities: No deformities/injuries.  5/5 grip strength and upper extremity flexion/extension, symmetrically.  Extremities are warm and well-perfused. Neuro: Cranial nerves normal, moves all extremities with globally weak but symmetric strength, speech fluent, extraocular movements intact. Psych: Attention normal, affect blunted, judgment and insight appear normal.     Data Reviewed: I have personally  reviewed following labs and imaging studies:  CBC: Recent Labs  Lab 04/09/18 0300 04/10/18 0317  WBC 21.7* 13.8*  NEUTROABS 17.7*  --   HGB 13.5 12.2  HCT 42.5 38.8  MCV 87.1 90.0  PLT 268 694   Basic Metabolic Panel: Recent Labs  Lab 04/09/18 0300 04/10/18 0317  NA 137 140  K 3.7  4.0  CL 102 108  CO2 23 26  GLUCOSE 124* 131*  BUN 12 7  CREATININE 0.68 0.57  CALCIUM 9.5 8.7*   GFR: Estimated Creatinine Clearance: 119.1 mL/min (by C-G formula based on SCr of 0.57 mg/dL). Liver Function Tests: No results for input(s): AST, ALT, ALKPHOS, BILITOT, PROT, ALBUMIN in the last 168 hours. No results for input(s): LIPASE, AMYLASE in the last 168 hours. No results for input(s): AMMONIA in the last 168 hours. Coagulation Profile: No results for input(s): INR, PROTIME in the last 168 hours. Cardiac Enzymes: No results for input(s): CKTOTAL, CKMB, CKMBINDEX, TROPONINI in the last 168 hours. BNP (last 3 results) No results for input(s): PROBNP in the last 8760 hours. HbA1C: No results for input(s): HGBA1C in the last 72 hours. CBG: No results for input(s): GLUCAP in the last 168 hours. Lipid Profile: No results for input(s): CHOL, HDL, LDLCALC, TRIG, CHOLHDL, LDLDIRECT in the last 72 hours. Thyroid Function Tests: No results for input(s): TSH, T4TOTAL, FREET4, T3FREE, THYROIDAB in the last 72 hours. Anemia Panel: No results for input(s): VITAMINB12, FOLATE, FERRITIN, TIBC, IRON, RETICCTPCT in the last 72 hours. Urine analysis:    Component Value Date/Time   COLORURINE YELLOW 04/09/2018 0301   APPEARANCEUR CLEAR 04/09/2018 0301   LABSPEC 1.021 04/09/2018 0301   PHURINE 5.0 04/09/2018 0301   GLUCOSEU NEGATIVE 04/09/2018 0301   HGBUR MODERATE (A) 04/09/2018 0301   BILIRUBINUR NEGATIVE 04/09/2018 0301   KETONESUR NEGATIVE 04/09/2018 0301   PROTEINUR NEGATIVE 04/09/2018 0301   NITRITE NEGATIVE 04/09/2018 0301   LEUKOCYTESUR NEGATIVE 04/09/2018 0301   Sepsis Labs: @LABRCNTIP (procalcitonin:4,lacticacidven:4)  ) Recent Results (from the past 240 hour(s))  Group A Strep by PCR     Status: None   Collection Time: 04/09/18  2:35 AM  Result Value Ref Range Status   Group A Strep by PCR NOT DETECTED NOT DETECTED Final    Comment: Performed at [facility], [place] [address] [place], [place] [address]  Blood culture (routine x 2)     Status: None (Preliminary result)   Collection Time: 04/09/18  3:01 AM  Result Value Ref Range Status   Specimen Description   Final    BLOOD LEFT FOREARM Performed at [facility], [place] [address], [place], [place] [address]    Special Requests   Final    BOTTLES DRAWN AEROBIC AND ANAEROBIC Blood Culture adequate volume Performed at [place] [address], [place], [place] [address]    Culture PENDING  Incomplete   Report Status PENDING  Incomplete  CSF culture     Status: None (Preliminary result)   Collection Time: 04/09/18 11:04 AM  Result Value Ref Range Status   Specimen Description   Final    CSF Performed at [place] [address], [place], [place] [address]    Special Requests   Final    NONE Performed at [facility], [place] [address], [place], [place] [address]    Gram Stain   Final    NO WBC SEEN NO ORGANISMS SEEN Gram Stain Report Called to,Read  Back By and Verified With: [facility] BY POTEAT,S Performed at [facility], [place] [address], [place], [place] [address]    Culture   Final    NO GROWTH < 24 HOURS Performed at [place] [address], [place], [place] [address]    Report Status PENDING  Incomplete  MRSA PCR Screening     Status: None   Collection Time: 04/09/18 12:26 PM  Result Value Ref Range Status   MRSA by PCR NEGATIVE NEGATIVE Final    Comment:        The GeneXpert MRSA Assay (FDA approved for NASAL specimens only), is one component of a comprehensive MRSA colonization surveillance program. It is not intended to diagnose MRSA infection nor to guide or monitor treatment for MRSA infections. Performed at [facility], [place] [address] [place],  [address]          Radiology Studies: Dg Chest 2 View  Result  Date: 04/09/2018 CLINICAL DATA:  Shortness of breath, chest congestion, weakness for past week, much worse tonight EXAM: CHEST - 2 VIEW COMPARISON:  07/09/2017 FINDINGS: The heart size and mediastinal contours are within normal limits. Both lungs are clear. The visualized skeletal structures are unremarkable. IMPRESSION: No active cardiopulmonary disease. Electronically Signed   By: Lucienne Capers M.D.   On: 04/09/2018 02:26   Ct Head Wo Contrast  Result Date: 04/09/2018 CLINICAL DATA:  Altered level of consciousness EXAM: CT HEAD WITHOUT CONTRAST TECHNIQUE: Contiguous axial images were obtained from the base of the skull through the vertex without intravenous contrast. COMPARISON:  03/28/2017 FINDINGS: Brain: No evidence of acute infarction, hemorrhage, hydrocephalus, or intra-axial masslike finding. There is known epidermoid the right CP angle cistern which is seen as a low-density causing mild mass effect on the right middle cerebellar peduncle. Dimensions are 15 x 21 mm, similar to prior when remeasured in a similar fashion. Vascular: Negative Skull: Negative Sinuses/Orbits: Negative IMPRESSION: No acute finding. 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 Mild mass-effect is stable from 2018 MRI. Electronically Signed   By: Monte Fantasia M.D.   On: 04/09/2018 10:07   Dg Fluoro Guided Loc Of Needle/cath Tip For Spinal Inject Lt  Result Date: 04/09/2018 CLINICAL DATA:  Fever, headache, nausea. EXAM: DIAGNOSTIC LUMBAR PUNCTURE UNDER FLUOROSCOPIC GUIDANCE FLUOROSCOPY TIME:  Fluoroscopy Time:  0 minutes 27 seconds Radiation Exposure Index (if provided by the fluoroscopic device): 4.70 mGy Number of Acquired Spot Images: 0 PROCEDURE: Informed consent was obtained from the patient prior to the procedure, including potential complications of headache, allergy, and pain. With the patient prone, the lower back was prepped with Betadine. 1% Lidocaine was used for local anesthesia. Lumbar puncture was  performed at the L4-5 level using a 20 gauge needle with return of clear, colorless CSF with an opening pressure of 27 cm water. 13 ml of CSF were obtained for laboratory studies. The patient tolerated the procedure well and there were no apparent complications. IMPRESSION: Successful fluoroscopic guided lumbar puncture. Electronically Signed   By: Claudie Revering M.D.   On: 04/09/2018 11:57        Scheduled Meds: . atomoxetine  40 mg Oral Daily  . busPIRone  5 mg Oral TID  . cariprazine  3 mg Oral Daily  . gabapentin  400 mg Oral TID  . valACYclovir  1,000 mg Oral TID  . varenicline  1 mg Oral BID   Continuous Infusions: . cefTRIAXone (ROCEPHIN)  IV  Stopped (04/10/18 0913)     LOS: 0 days    Time spent: 35 minutes    Edwin Dada, MD [facility] 04/10/2018, 1:21 PM     Pager [phone] --- please page though AMION:  [email or web address] If 7PM-7AM, please contact night-coverage

## 2018-04-11 LAB — URINE CULTURE

## 2018-04-11 NOTE — Discharge Summary (Signed)
Physician Discharge Summary  Lillah Standre UUV:[id number] DOB: 07/30/1990 DOA: 04/09/2018  PCP: Katherina Mires, MD  Admit date: 04/09/2018 Discharge date: 04/11/2018  Admitted From: Home  Disposition:  Home   Recommendations for Outpatient Follow-up:  1. Follow up with PCP in 1-2 weeks 2. Reasonable to consider EBV titers at follow up appointment 3. Please follow up final 5 day Blood and CSF final culture results via [place]: None  Equipment/Devices: None  Discharge Condition: Good  CODE STATUS: FULL Diet recommendation: Regular  Brief/Interim Summary: Ms Delavega is a 27 y.o. F with Bipolar and ADD who presented with few weeks cough, then (the night of admission) new acute chills, malaise, generalized weakness, nasal congestion and sore throat.  In ER had Fever 102, HR 142, RR 44, and lactic acid 2.1, but negative CXR, clear urine, negative flu.  Started on empiric antibiotics for meningitis due to headache, admitted to ICU.     PRINCIPAL HOSPITAL DIAGNOSIS: Sepsis from Viral URI syndrome probably exacerbated by atomoxetine    Discharge Diagnoses:   Sepsis syndrome from viral infection Presented with fever 102F,  HR 142, RR 44, and lactic acid >2, however, no clear localizing source yet other than viral URI symptoms.  Treated with empiric ganciclovir due to acyclovir shortage, but HSV PCR negative.  She was treated with ceftriaxone for 3 days.  Blood cultures were negative.  LP was obtained after antibiotics, but there were no WBCs in tube 4 and culture was negative.  Large RBCs in tube 1, very few in tube 4, consistent with traumatic tap, no xanthochromia.     Sinus tachycardia Resolved by discharge, but likely exacerbated by atomoxetine, possibly by other psychotropic medications.  Asymptomatic bacteriuria GBS vs aerococcus in urine, asymptomatic, no flank pain, low suspicion for UTI.  Mood disorder No change to atomoxetine, BuSpar,  gabapentin, and cariprazine.  Smoking Smoking cessation encouraged.           Discharge Instructions  Discharge Instructions    Diet general   Complete by:  As directed    Discharge instructions   Complete by:  As directed    From Dr. Loleta Books: You were admitted for fever and what appeared to be sepsis (out of control infection). However, none of your cultures (blood, urine or spinal fluid) grew anything.  Our testing for infection in your spine was completely normal (no signs of meningitis or encephalitis).  I suspect you had just a very bad response to a viral upper respiratory infection.  This may have been exaggerated because of your atomoxetine or other medicines.  Call Suzanna Obey for a follow up appointment in 1 week.   Drink fluids Wash your hands.   Increase activity slowly   Complete by:  As directed      Allergies as of 04/11/2018      Reactions   Wellbutrin [bupropion] Rash      Medication List    STOP taking these medications   cefdinir 300 MG capsule Commonly known as:  OMNICEF   cetirizine 10 MG tablet Commonly known as:  ZYRTEC   guaiFENesin-codeine 100-10 MG/5ML syrup Commonly known as:  ROBITUSSIN AC   HYDROcodone-homatropine 5-1.5 MG/5ML syrup Commonly known as:  HYCODAN     TAKE these medications   acetaminophen 500 MG tablet Commonly known as:  TYLENOL Take 1,000 mg by mouth. Every 6 to 8 hours as needed for pain   atomoxetine 40 MG capsule Commonly known as:  STRATTERA  Take 40 mg by mouth daily.   busPIRone 5 MG tablet Commonly known as:  BUSPAR Take 5 mg by mouth 3 (three) times daily.   CHANTIX STARTING MONTH PAK 0.5 MG X 11 & 1 MG X 42 tablet Generic drug:  varenicline Take 1 tablet by mouth as directed.   gabapentin 400 MG capsule Commonly known as:  NEURONTIN Take 400 mg by mouth 3 (three) times daily.   hydrOXYzine 25 MG tablet Commonly known as:  ATARAX/VISTARIL Take 25 mg by mouth 3 (three) times daily as  needed for anxiety.   rOPINIRole 0.25 MG tablet Commonly known as:  REQUIP Take 0.25 mg by mouth daily as needed (restless legs).   VRAYLAR capsule Generic drug:  cariprazine Take 3 mg by mouth daily.       Allergies  Allergen Reactions  . Wellbutrin [Bupropion] Rash    Consultations:  None   Procedures/Studies: Dg Chest 2 View  Result Date: 04/09/2018 CLINICAL DATA:  Shortness of breath, chest congestion, weakness for past week, much worse tonight EXAM: CHEST - 2 VIEW COMPARISON:  07/09/2017 FINDINGS: The heart size and mediastinal contours are within normal limits. Both lungs are clear. The visualized skeletal structures are unremarkable. IMPRESSION: No active cardiopulmonary disease. Electronically Signed   By: Lucienne Capers M.D.   On: 04/09/2018 02:26   Ct Head Wo Contrast  Result Date: 04/09/2018 CLINICAL DATA:  Altered level of consciousness EXAM: CT HEAD WITHOUT CONTRAST TECHNIQUE: Contiguous axial images were obtained from the base of the skull through the vertex without intravenous contrast. COMPARISON:  03/28/2017 FINDINGS: Brain: No evidence of acute infarction, hemorrhage, hydrocephalus, or intra-axial masslike finding. There is known epidermoid the right CP angle cistern which is seen as a low-density causing mild mass effect on the right middle cerebellar peduncle. Dimensions are 15 x 21 mm, similar to prior when remeasured in a similar fashion. Vascular: Negative Skull: Negative Sinuses/Orbits: Negative IMPRESSION: No acute finding. 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 Mild mass-effect is stable from 2018 MRI. Electronically Signed   By: Monte Fantasia M.D.   On: 04/09/2018 10:07   Dg Fluoro Guided Loc Of Needle/cath Tip For Spinal Inject Lt  Result Date: 04/09/2018 CLINICAL DATA:  Fever, headache, nausea. EXAM: DIAGNOSTIC LUMBAR PUNCTURE UNDER FLUOROSCOPIC GUIDANCE FLUOROSCOPY TIME:  Fluoroscopy Time:  0 minutes 27 seconds Radiation Exposure Index (if  provided by the fluoroscopic device): 4.70 mGy Number of Acquired Spot Images: 0 PROCEDURE: Informed consent was obtained from the patient prior to the procedure, including potential complications of headache, allergy, and pain. With the patient prone, the lower back was prepped with Betadine. 1% Lidocaine was used for local anesthesia. Lumbar puncture was performed at the L4-5 level using a 20 gauge needle with return of clear, colorless CSF with an opening pressure of 27 cm water. 13 ml of CSF were obtained for laboratory studies. The patient tolerated the procedure well and there were no apparent complications. IMPRESSION: Successful fluoroscopic guided lumbar puncture. Electronically Signed   By: Claudie Revering M.D.   On: 04/09/2018 11:57       Subjective: Feeling well.  Congested, still with sore throat.  No urinary irritative symptoms.  No flank pain, no vomiting.  Discharge Exam: Vitals:   04/10/18 1652 04/11/18 0400  BP: 124/87 122/76  Pulse: 97 78  Resp: 16 18  Temp: 97.9 F (36.6 C) 98.3 F (36.8 C)  SpO2: 97% 98%   Vitals:   04/10/18 1600 04/10/18 1630 04/10/18 1652  04/11/18 0400  BP: 138/90  124/87 122/76  Pulse: 98 91 97 78  Resp: 20 20 16 18   Temp:   97.9 F (36.6 C) 98.3 F (36.8 C)  TempSrc:   Oral Oral  SpO2: 99% 99% 97% 98%  Weight:   103 kg   Height:   5\' 2"  (1.575 m)     General: Pt is alert, awake, not in acute distress Cardiovascular: RRR, nl S1-S2, no murmurs appreciated.   No LE edema.   Respiratory: Normal respiratory rate and rhythm.  CTAB without rales or wheezes. Abdominal: Abdomen soft and non-tender.  No distension or HSM.   Neuro/Psych: Strength symmetric in upper and lower extremities.  Judgment and insight appear normal.   The results of significant diagnostics from this hospitalization (including imaging, microbiology, ancillary and laboratory) are listed below for reference.     Microbiology: Recent Results (from the past 240 hour(s))   Group A Strep by PCR     Status: None   Collection Time: 04/09/18  2:35 AM  Result Value Ref Range Status   Group A Strep by PCR NOT DETECTED NOT DETECTED Final    Comment: Performed at [facility], [place] [address] [place], [place] [address]  Blood culture (routine x 2)     Status: None (Preliminary result)   Collection Time: 04/09/18  3:01 AM  Result Value Ref Range Status   Specimen Description   Final    BLOOD RIGHT ANTECUBITAL Performed at [facility] [address] [place], [place] [address]    Special Requests   Final    BOTTLES DRAWN AEROBIC AND ANAEROBIC Blood Culture adequate volume Performed at [facility] [address] [place], [place] [address]    Culture   Final    NO GROWTH 1 DAY Performed at [facility], [place] [address], [place], [place] [address]    Report Status PENDING  Incomplete  Blood culture (routine x 2)     Status: None (Preliminary result)   Collection Time: 04/09/18  3:01 AM  Result Value Ref Range Status   Specimen Description   Final    BLOOD LEFT FOREARM Performed at [facility], [place] [address], [place], [place] [address]    Special Requests   Final    BOTTLES DRAWN AEROBIC AND ANAEROBIC Blood Culture adequate volume Performed at [facility] [address], [place], [place] [address]    Culture   Final    NO GROWTH 1 DAY Performed at [facility], [place] [address], [place], [place] [address]    Report Status PENDING  Incomplete  CSF culture     Status: None (Preliminary result)   Collection Time: 04/09/18 11:04 AM  Result Value Ref Range Status   Specimen Description   Final    CSF Performed at [facility] [address], [place], [place] [address]    Special Requests   Final    NONE Performed at [facility], [place] [address], [place], [place] [address]    Gram Stain   Final    NO WBC SEEN NO  ORGANISMS SEEN Gram Stain Report Called to,Read Back By and Verified With: [facility] @ 1937 ON [id number] BY POTEAT,S Performed at [facility] [address] [place], [place] [address]    Culture   Final    NO GROWTH 2 DAYS Performed at [facility]  [address], [place], [place] [address]    Report Status PENDING  Incomplete  MRSA PCR Screening     Status: None   Collection Time: 04/09/18 12:26 PM  Result Value Ref Range Status   MRSA by PCR NEGATIVE NEGATIVE Final    Comment:        The GeneXpert MRSA Assay (FDA approved for NASAL specimens only), is one component of a comprehensive MRSA colonization surveillance program. It is not intended to diagnose MRSA infection nor to guide or monitor treatment for MRSA infections. Performed at [facility], [place] [address], [place], [place] [address]   Culture, Urine     Status: None (Preliminary result)   Collection Time: 04/10/18  3:27 AM  Result Value Ref Range Status   Specimen Description   Final    URINE, RANDOM Performed at [facility], [place] [address] [place], [place] [address]    Special Requests   Final    Immunocompromised Performed at [facility], [place] [address] [place], [place] [address]    Culture   Final    CULTURE REINCUBATED FOR BETTER GROWTH Performed at [facility], [place] [address], [place], [place] [address]    Report Status PENDING  Incomplete     Labs: BNP (last 3 results) No results for input(s): BNP in the last 8760 hours. Basic Metabolic Panel: Recent Labs  Lab 04/09/18 0300 04/10/18 0317  NA 137 140  K 3.7 4.0  CL 102 108  CO2 23 26  GLUCOSE 124* 131*  BUN 12 7  CREATININE 0.68 0.57  CALCIUM 9.5 8.7*   Liver Function Tests: No results for input(s): AST, ALT, ALKPHOS, BILITOT, PROT, ALBUMIN in the last 168 hours. No results for input(s): LIPASE, AMYLASE in the last 168 hours. No results for  input(s): AMMONIA in the last 168 hours. CBC: Recent Labs  Lab 04/09/18 0300 04/10/18 0317  WBC 21.7* 13.8*  NEUTROABS 17.7*  --   HGB 13.5 12.2  HCT 42.5 38.8  MCV 87.1 90.0  PLT 268 215   Cardiac Enzymes: No results for input(s): CKTOTAL, CKMB, CKMBINDEX, TROPONINI in the last 168 hours. BNP: Invalid input(s): POCBNP CBG: No results for input(s): GLUCAP in the last 168 hours. D-Dimer No results for input(s): DDIMER in the last 72 hours. Hgb A1c No results for input(s): HGBA1C in the last 72 hours. Lipid Profile No results for input(s): CHOL, HDL, LDLCALC, TRIG, CHOLHDL, LDLDIRECT in the last 72 hours. Thyroid function studies No results for input(s): TSH, T4TOTAL, T3FREE, THYROIDAB in the last 72 hours.  Invalid input(s): FREET3 Anemia work up No results for input(s): VITAMINB12, FOLATE, FERRITIN, TIBC, IRON, RETICCTPCT in the last 72 hours. Urinalysis    Component Value Date/Time   COLORURINE YELLOW 04/09/2018 0301   APPEARANCEUR CLEAR 04/09/2018 0301   LABSPEC 1.021 04/09/2018 0301   PHURINE 5.0 04/09/2018 0301   GLUCOSEU NEGATIVE 04/09/2018 0301   HGBUR MODERATE (A) 04/09/2018 0301   BILIRUBINUR NEGATIVE 04/09/2018 0301   KETONESUR NEGATIVE 04/09/2018 0301   PROTEINUR NEGATIVE 04/09/2018 0301   NITRITE NEGATIVE 04/09/2018 0301   LEUKOCYTESUR NEGATIVE 04/09/2018 0301   Sepsis Labs Invalid input(s): PROCALCITONIN,  WBC,  LACTICIDVEN Microbiology Recent Results (from the past 240 hour(s))  Group A Strep by PCR     Status: None   Collection Time: 04/09/18  2:35 AM  Result Value Ref Range Status   Group A Strep by PCR NOT DETECTED NOT DETECTED Final  Comment: Performed at [facility], [place] [address], [place], [place] [address]  Blood culture (routine x 2)     Status: None (Preliminary result)   Collection Time: 04/09/18  3:01 AM  Result Value Ref Range Status   Specimen Description   Final    BLOOD RIGHT ANTECUBITAL Performed at [facility] [address] [place], [place] [address]    Special Requests   Final    BOTTLES DRAWN AEROBIC AND ANAEROBIC Blood Culture adequate volume Performed at [facility] [address], [place], [place] [address]    Culture   Final    NO GROWTH 1 DAY Performed at [facility], [place] [address] [place], [place] [address]    Report Status PENDING  Incomplete  Blood culture (routine x 2)     Status: None (Preliminary result)   Collection Time: 04/09/18  3:01 AM  Result Value Ref Range Status   Specimen Description   Final    BLOOD LEFT FOREARM Performed at [facility], [place] [address] [place], [place] [address]    Special Requests   Final    BOTTLES DRAWN AEROBIC AND ANAEROBIC Blood Culture adequate volume Performed at [facility] [address], [place], [place] [address]    Culture   Final    NO GROWTH 1 DAY Performed at [facility], [place] [address], [place], [place] [address]    Report Status PENDING  Incomplete  CSF culture     Status: None (Preliminary result)   Collection Time: 04/09/18 11:04 AM  Result Value Ref Range Status   Specimen Description   Final    CSF Performed at [facility] [address] [place], [place] [address]    Special Requests   Final    NONE Performed at [facility], [place] [address] [place], [place] [address]    Gram Stain   Final    NO WBC SEEN NO ORGANISMS SEEN Gram Stain Report Called to,Read Back By and Verified With: [facility] @ 6144 ON [id number] BY POTEAT,S Performed at [facility] [address] [place], [place] [address]    Culture   Final    NO GROWTH 2 DAYS Performed at [facility] [address], [place], [place] [address]    Report Status PENDING  Incomplete  MRSA PCR Screening     Status: None   Collection Time: 04/09/18 12:26 PM  Result Value Ref Range Status   MRSA by PCR  NEGATIVE NEGATIVE Final    Comment:        The GeneXpert MRSA Assay (FDA approved for NASAL specimens only), is one component of a comprehensive MRSA colonization surveillance program. It is not intended to diagnose MRSA infection nor to guide or monitor treatment for MRSA infections. Performed at [facility], [place] [address], [place], [place] [address]   Culture, Urine     Status: None (Preliminary result)   Collection Time: 04/10/18  3:27 AM  Result Value Ref Range Status   Specimen Description   Final    URINE, RANDOM Performed at [facility], [place] [address], [place], [place] [address]    Special Requests   Final    Immunocompromised Performed at [facility], [place] [address] [place],  [address]    Culture   Final    CULTURE REINCUBATED FOR BETTER GROWTH Performed at  [facility], [place] [address], [place], [place] [address]    Report Status PENDING  Incomplete     Time coordinating discharge: 40 minutes       SIGNED:   Edwin Dada, MD  [facility] 04/11/2018, 9:06 AM

## 2018-04-12 LAB — CSF CULTURE W GRAM STAIN
Culture: NO GROWTH
Gram Stain: NONE SEEN

## 2018-04-12 LAB — CSF CULTURE

## 2018-04-14 ENCOUNTER — Other Ambulatory Visit (HOSPITAL_COMMUNITY)
Admission: RE | Admit: 2018-04-14 | Discharge: 2018-04-14 | Disposition: A | Discharge status: Home / Self Care | Payer: Medicaid Other | Source: Ambulatory Visit | Attending: Obstetrics and Gynecology | Admitting: Obstetrics and Gynecology

## 2018-04-14 ENCOUNTER — Encounter: Payer: Self-pay | Admitting: Obstetrics and Gynecology

## 2018-04-14 ENCOUNTER — Ambulatory Visit: Payer: Medicaid Other | Admitting: Obstetrics and Gynecology

## 2018-04-14 VITALS — BP 133/87 | HR 99 | Ht 62.0 in | Wt 229.0 lb

## 2018-04-14 DIAGNOSIS — N9089 Other specified noninflammatory disorders of vulva and perineum: Secondary | ICD-10-CM | POA: Insufficient documentation

## 2018-04-14 LAB — CULTURE, BLOOD (ROUTINE X 2)
CULTURE: NO GROWTH
Culture: NO GROWTH
Special Requests: ADEQUATE
Special Requests: ADEQUATE

## 2018-04-14 NOTE — Progress Notes (Signed)
27 yo referred for perineal skin tag removal as it is bothersome to the patient when wearing underwear. Patient states she noticed it a few months ago. Patient is without complaints. She reports a normal pap smear a few months ago  Past Medical History:  Diagnosis Date  . Anxiety   . Back pain, chronic   . Bipolar 1 disorder ([place])   . PCOD (polycystic ovarian disease)    No past surgical history on file. Family History  Problem Relation Age of Onset  . Hypertension Mother   . Ulcerative colitis Mother    Social History   Tobacco Use  . Smoking status: Former Smoker    Types: Cigarettes  . Smokeless tobacco: Never Used  Substance Use Topics  . Alcohol use: Yes    Comment: occasionally  . Drug use: No   ROS See pertinent in HPI  Blood pressure 133/87, pulse 99, height 5\' 2"  (1.575 m), weight 229 lb (103.9 kg).  GENERAL: Well-developed, well-nourished female in no acute distress.  ABDOMEN: Soft, nontender, nondistended. No organomegaly. PELVIC: Normal external female genitalia with 0.5 cm skin tag on left labia majora. Vagina is pink and rugated.  Normal discharge.  EXTREMITIES: No cyanosis, clubbing, or edema, 2+ distal pulses.  A/P 27 yo with perineal skin tag PROCEDURE After informed consent was obtained, 2 cc lidocaine was injected in the skin underlying the skin tag. Using a scalpel, the skin tag was removed. Hemostasis was achieved with 4.0 Vicryl. The patient tolerated the procedure well  Patient with normal pap smear 07/18/2015

## 2018-04-14 NOTE — Patient Instructions (Signed)
Skin Tag, Adult A skin tag (acrochordon) is a soft, extra growth of skin. Most skin tags are flesh-colored and rarely bigger than a pencil eraser. They commonly form near areas where there are folds in the skin, such as the armpit or groin. Skin tags are not dangerous, and they do not spread from person to person (are not contagious). You may have one skin tag or several. Skin tags do not require treatment. However, your health care provider may recommend removal of a skin tag if it:  Gets irritated from clothing.  Bleeds.  Is visible and unsightly.  Your health care provider can remove skin tags with a simple surgical procedure or a procedure that involves freezing the skin tag. Follow these instructions at home:  Watch for any changes in your skin tag. A normal skin tag does not require any other special care at home.  Take over-the-counter and prescription medicines only as told by your health care provider.  Keep all follow-up visits as told by your health care provider. This is important. Contact a health care provider if:  You have a skin tag that: ? Becomes painful. ? Changes color. ? Bleeds. ? Swells.  You develop more skin tags. This information is not intended to replace advice given to you by your health care provider. Make sure you discuss any questions you have with your health care provider. Document Released: 06/04/2015 Document Revised: 01/14/2016 Document Reviewed: 06/04/2015 Elsevier Interactive Patient Education  2018 [employer]  

## 2018-04-24 DIAGNOSIS — R9089 Other abnormal findings on diagnostic imaging of central nervous system: Secondary | ICD-10-CM | POA: Insufficient documentation

## 2018-04-29 DIAGNOSIS — N912 Amenorrhea, unspecified: Secondary | ICD-10-CM | POA: Insufficient documentation

## 2018-04-29 DIAGNOSIS — E039 Hypothyroidism, unspecified: Secondary | ICD-10-CM | POA: Insufficient documentation

## 2018-04-29 DIAGNOSIS — N941 Unspecified dyspareunia: Secondary | ICD-10-CM | POA: Insufficient documentation

## 2018-07-07 ENCOUNTER — Ambulatory Visit: Payer: Medicaid Other | Attending: Anesthesiology | Admitting: Physical Therapy

## 2018-07-07 ENCOUNTER — Other Ambulatory Visit: Payer: Self-pay

## 2018-07-07 ENCOUNTER — Encounter: Payer: Self-pay | Admitting: Physical Therapy

## 2018-07-07 DIAGNOSIS — M545 Low back pain, unspecified: Secondary | ICD-10-CM

## 2018-07-07 DIAGNOSIS — G8929 Other chronic pain: Secondary | ICD-10-CM | POA: Diagnosis present

## 2018-07-07 DIAGNOSIS — R293 Abnormal posture: Secondary | ICD-10-CM | POA: Diagnosis present

## 2018-07-08 ENCOUNTER — Encounter: Payer: Self-pay | Admitting: Physical Therapy

## 2018-07-08 NOTE — Therapy (Signed)
[place], [place], [address] Phone: [phone]   Fax:  [phone]  Physical Therapy Evaluation  Patient Details  Name: Allison Chandler MRN: [id number] Date of Birth: November 15, 1990 Referring Provider (PT): Shanon Ace    Encounter Date: 07/07/2018  PT End of Session - 07/08/18 1125    Visit Number  1    Number of Visits  4    Date for PT Re-Evaluation  08/05/18    Authorization Type  Medicaid     PT Start Time  0800    PT Stop Time  0843    PT Time Calculation (min)  43 min    Activity Tolerance  Patient tolerated treatment well    Behavior During Therapy  [facility] for tasks assessed/performed       Past Medical History:  Diagnosis Date  . Anxiety   . Back pain, chronic   . Bipolar 1 disorder ([place])   . PCOD (polycystic ovarian disease)     History reviewed. No pertinent surgical history.  There were no vitals filed for this visit.   Subjective Assessment - 07/07/18 0804    Subjective  Patient reports a long history of lower back/ and tailbone pain. The pain gets worse through the day when she sits. She fells down the stairs a few yesr ago and now most of her pain is in her tailbone. Her x-ray was negative for fx. She is curently going to PT for low back pain   (Pended)     Pertinent History  PCOD, Bi-polar, Anxiety, Chronic lower back pain   (Pended)     Currently in Pain?  Yes    Pain Score  4     Pain Location  Coccyx    Pain Orientation  Mid    Pain Descriptors / Indicators  Sharp    Pain Type  Chronic pain    Pain Onset  More than a month ago    Pain Frequency  Constant    Aggravating Factors   sitting     Pain Relieving Factors  Standing or leaning forward     Effect of Pain on Daily Activities  Difficulty perfroming ADL's          [facility] PT Assessment - 07/08/18 0001      Assessment   Medical Diagnosis  Tailbone pain     Referring Provider (PT)  Shanon Ace      Onset Date/Surgical Date  --   December 2016    Hand Dominance  Right    Prior Therapy  In therapy at this time for pain in her mid back       Precautions   Precautions  None      Restrictions   Weight Bearing Restrictions  No      Balance Screen   Has the patient fallen in the past 6 months  No    Has the patient had a decrease in activity level because of a fear of falling?   No    Is the patient reluctant to leave their home because of a fear of falling?   No      Prior Function   Level of Independence  Independent    Vocation  Workers comp    [employer] a [employer] but is currently out     Leisure  Nothing       Cognition   Overall Cognitive Status  Within Functional Limits for tasks  assessed    Attention  Focused    Focused Attention  Appears intact    Memory  Appears intact    Awareness  Appears intact    Problem Solving  Appears intact      Observation/Other Assessments   Focus on Therapeutic Outcomes (FOTO)   Medicaid       Sensation   Light Touch  Appears Intact    Additional Comments  Denies parathesias       Coordination   Gross Motor Movements are Fluid and Coordinated  Yes    Fine Motor Movements are Fluid and Coordinated  Yes      Posture/Postural Control   Posture Comments  forward head       AROM   Lumbar Flexion  WNL     Lumbar Extension  WNL but pain in mid back     Lumbar - Right Rotation  Pain in the mid back     Lumbar - Left Rotation  Pain in the mid back       PROM   Overall PROM Comments  pain with passive ER/IR of the hip       Strength   Overall Strength Comments  5/5 gross bilateral LE strength       Palpation   SI assessment   Decreased ILA spac bilaterally     Palpation comment  tenderness to palpation in the sacrum and down into the tailbone.       Ambulation/Gait   Gait Comments  Normal exam                 Objective measurements completed on examination: See above findings.       [facility] PT Treatment/Exercise - 07/08/18 0001      Lumbar Exercises: Stretches   Lower Trunk Rotation Limitations  x10     Piriformis Stretch Limitations  Figure 4 and pull 2x30 sec each leg       Lumbar Exercises: Supine   Other Supine Lumbar Exercises  supine ball squeeze x10                PT Short Term Goals - 07/08/18 1226      PT SHORT TERM GOAL #1   Title  Patient will be independent with intial HEP     Time  3    Period  Weeks    Status  New    Target Date  07/29/18      PT SHORT TERM GOAL #2   Title  Patient will repot a reduction in pain to 2/10 at workst when sitting     Baseline  can reach a 7/10     Time  3    Period  Weeks    Status  New    Target Date  07/29/18      PT SHORT TERM GOAL #3   Title  Patient will demonstrate full hip ROM without pain    Baseline  Pain with IR/ ER     Time  3    Period  Weeks    Status  New    Target Date  07/29/18                Plan - 07/08/18 1125    Clinical Impression Statement  Patient ais a 28 year old female with tailbone and scaral pain. She has increased pain when she sits. She has minor increased pain with bilateral hip internal and external rotation. She has significant tendenress  to palaption of her sacrum and coccyx. She has a decreased sacral sulcus on the left side.  She would benefit from skilled therapy to decrease stress on Sacrum and Coccyx. She was advised to get a coccyx pillow offline to sit on at work.      History and Personal Factors relevant to plan of care:  Bi-polar; Mid back pain; chronic back pain     Clinical Presentation  Evolving    Clinical Presentation due to:  Pain that is incresingly limiting her ability to sit     Clinical Decision Making  Moderate    PT Frequency  2x / week    PT Duration  8 weeks    PT Treatment/Interventions  ADLs/Self Care Home Management;Cryotherapy;Electrical Stimulation;Iontophoresis 4mg /ml Dexamethasone;Moist Heat;Ultrasound;DME  Instruction;Gait training;Stair training;Therapeutic activities;Therapeutic exercise;Neuromuscular re-education;Cognitive remediation;Patient/family education;Manual techniques;Passive range of motion;Dry needling;Taping    PT Next Visit Plan  cosndier sacral shotgun technique; consider bullterfly stretch and hamstring stretch; light strengthening of the pelvis and gluteals as able. Patient is also having therapy at a different clinic for her mid back.     PT Home Exercise Plan  figure 4 stretch; pirifromis stretch; lateral trunk rotation; ball squeeze     Consulted and Agree with Plan of Care  Patient       Patient will benefit from skilled therapeutic intervention in order to improve the following deficits and impairments:  Improper body mechanics, Pain, Postural dysfunction, Decreased activity tolerance, Decreased endurance, Decreased range of motion, Difficulty walking  Visit Diagnosis: Chronic bilateral low back pain without sciatica  Abnormal posture     Problem List Patient Active Problem List   Diagnosis Date Noted  . SIRS (systemic inflammatory response syndrome) ([place]) 04/09/2018  . Bipolar disorder ([place]) 04/09/2018  . ADD (attention deficit disorder) 04/09/2018  . Upper respiratory tract infection 08/13/2017    Carney Living  PT DPT  07/08/2018, 12:34 PM  [facility] [address] [place], [place], [address] Phone: [phone]   Fax:  [phone]  Name: Allison Chandler MRN: [id number] Date of Birth: [birth year]

## 2018-07-21 ENCOUNTER — Encounter: Payer: Self-pay | Admitting: Physical Therapy

## 2018-07-21 ENCOUNTER — Ambulatory Visit: Payer: Medicaid Other | Admitting: Physical Therapy

## 2018-07-21 DIAGNOSIS — R293 Abnormal posture: Secondary | ICD-10-CM

## 2018-07-21 DIAGNOSIS — M545 Low back pain, unspecified: Secondary | ICD-10-CM

## 2018-07-21 DIAGNOSIS — G8929 Other chronic pain: Secondary | ICD-10-CM

## 2018-07-21 NOTE — Therapy (Signed)
[place], [place], [address] Phone: [phone]   Fax:  [phone]  Physical Therapy Treatment  Patient Details  Name: Allison Chandler MRN: [id number] Date of Birth: [birth year] Referring Provider (PT): Shanon Ace    Encounter Date: 07/21/2018  PT End of Session - 07/21/18 0850    Visit Number  2    Number of Visits  4    Date for PT Re-Evaluation  08/05/18    Authorization Type  Medicaid     Authorization Time Period  07/16/2018-08/05/2018    Authorization - Visit Number  1    Authorization - Number of Visits  3    PT Start Time  2620    PT Stop Time  0935    PT Time Calculation (min)  48 min    Activity Tolerance  Patient tolerated treatment well    Behavior During Therapy  [facility] for tasks assessed/performed       Past Medical History:  Diagnosis Date  . Anxiety   . Back pain, chronic   . Bipolar 1 disorder ([place])   . PCOD (polycystic ovarian disease)     History reviewed. No pertinent surgical history.  There were no vitals filed for this visit.  Subjective Assessment - 07/21/18 0848    Subjective  I have not performed any HEP but I plan to start.     Currently in Pain?  Yes    Pain Score  4     Pain Location  Coccyx    Pain Orientation  Mid    Pain Descriptors / Indicators  Aching;Dull    Aggravating Factors   as the day progresses, pain gets more intense     Pain Relieving Factors  standing or leaning forward.                        [facility] Adult PT Treatment/Exercise - 07/21/18 0001      Lumbar Exercises: Stretches   Active Hamstring Stretch  3 reps;30 seconds    Lower Trunk Rotation Limitations  x10     Piriformis Stretch Limitations  Figure 4 and pull 2x30 sec each leg       Lumbar Exercises: Supine   Pelvic Tilt  10 reps    Bridge  10 reps    Bridge Limitations  smll ROM    Other Supine Lumbar Exercises  supine ball squeeze x10       Modalities   Modalities  Moist Heat;Electrical Stimulation      Moist Heat Therapy   Number Minutes Moist Heat  15 Minutes    Moist Heat Location  Lumbar Spine   prone     Electrical Stimulation   Electrical Stimulation Location  Sacrum    Electrical Stimulation Action  IFC x 15 min    Electrical Stimulation Parameters  47ma    Electrical Stimulation Goals  Pain               PT Short Term Goals - 07/08/18 1226      PT SHORT TERM GOAL #1   Title  Patient will be independent with intial HEP     Time  3    Period  Weeks    Status  New    Target Date  07/29/18      PT SHORT TERM GOAL #2   Title  Patient will repot a reduction in pain to 2/10 at workst when sitting  Baseline  can reach a 7/10     Time  3    Period  Weeks    Status  New    Target Date  07/29/18      PT SHORT TERM GOAL #3   Title  Patient will demonstrate full hip ROM without pain    Baseline  Pain with IR/ ER     Time  3    Period  Weeks    Status  New    Target Date  07/29/18               Plan - 07/21/18 0923    Clinical Impression Statement  Pt reports she has not performed her HEP since initial evauation. We reviewed and progressed therex. She is requesting a copy of evaluation for referring provider and I informed her that the evauluation summary was sent electronically. Trial of HMP and IFC at end of session.     PT Next Visit Plan  assess response to IF ; consider sacral shotgun technique; consider bullterfly stretch and hamstring stretch; light strengthening of the pelvis and gluteals as able. Patient is also having therapy at a different clinic for her mid back.     PT Home Exercise Plan  figure 4 stretch; pirifromis stretch; lateral trunk rotation; ball squeeze     Consulted and Agree with Plan of Care  Patient       Patient will benefit from skilled therapeutic intervention in order to improve the following deficits and impairments:  Improper body mechanics, Pain, Postural dysfunction,  Decreased activity tolerance, Decreased endurance, Decreased range of motion, Difficulty walking  Visit Diagnosis: Chronic bilateral low back pain without sciatica  Abnormal posture     Problem List Patient Active Problem List   Diagnosis Date Noted  . SIRS (systemic inflammatory response syndrome) ([place]) 04/09/2018  . Bipolar disorder ([place]) 04/09/2018  . ADD (attention deficit disorder) 04/09/2018  . Upper respiratory tract infection 08/13/2017    Dorene Ar , PTA 07/21/2018, 9:25 AM  [facility] [address] [place], [place], [address] Phone: [phone]   Fax:  [phone]  Name: Beryle Bagsby MRN: [id number] Date of Birth: 27-Apr-1991

## 2018-07-28 ENCOUNTER — Ambulatory Visit: Payer: Medicaid Other | Admitting: Physical Therapy

## 2018-07-28 ENCOUNTER — Encounter: Payer: Self-pay | Admitting: Physical Therapy

## 2018-07-28 DIAGNOSIS — R293 Abnormal posture: Secondary | ICD-10-CM

## 2018-07-28 DIAGNOSIS — M545 Low back pain: Principal | ICD-10-CM

## 2018-07-28 DIAGNOSIS — G8929 Other chronic pain: Secondary | ICD-10-CM

## 2018-07-28 NOTE — Therapy (Addendum)
[place], [place], [address] Phone: [phone]   Fax:  [phone]  Physical Therapy Treatment  Patient Details  Name: Allison Chandler MRN: [id number] Date of Birth: July 22, 1990 Referring Provider (PT): Shanon Ace    Encounter Date: 07/28/2018  PT End of Session - 07/28/18 1602    Visit Number  3    Number of Visits  4    Date for PT Re-Evaluation  08/05/18    Authorization Type  Medicaid     Authorization Time Period  07/16/2018-08/05/2018    Authorization - Visit Number  1    Authorization - Number of Visits  3    PT Start Time  0930    PT Stop Time  1027    PT Time Calculation (min)  57 min    Activity Tolerance  Patient tolerated treatment well    Behavior During Therapy  [facility] for tasks assessed/performed       Past Medical History:  Diagnosis Date  . Anxiety   . Back pain, chronic   . Bipolar 1 disorder ([place])   . PCOD (polycystic ovarian disease)     History reviewed. No pertinent surgical history.  There were no vitals filed for this visit.  Subjective Assessment - 07/28/18 0857    Subjective  Patient hads been working on her stretches more. She feles like it is getting better. She is sore today in her shulder and her tailbone.     Currently in Pain?  Yes    Pain Score  6     Pain Orientation  Mid    Pain Descriptors / Indicators  Aching;Dull    Pain Type  Chronic pain    Pain Onset  More than a month ago    Pain Frequency  Constant    Aggravating Factors   increased pain with sitting     Pain Relieving Factors  standing and leaning     Effect of Pain on Daily Activities  difficulty perfroming ADL's                       [facility] Adult PT Treatment/Exercise - 07/28/18 0001      Lumbar Exercises: Stretches   Active Hamstring Stretch  3 reps;30 seconds    Lower Trunk Rotation Limitations  x10     Piriformis Stretch Limitations  Figure 4 and pull 2x30 sec each  leg     Other Lumbar Stretch Exercise  tennis ball trigger point release     Other Lumbar Stretch Exercise  buttefly stretch       Moist Heat Therapy   Number Minutes Moist Heat  15 Minutes   reviewed where to buy TENS    Moist Heat Location  Lumbar Spine   prone     Electrical Stimulation   Electrical Stimulation Location  Sacrum    Electrical Stimulation Goals  Pain      Manual Therapy   Manual Therapy  Muscle Energy Technique;Manual Traction;Soft tissue mobilization    Manual therapy comments  sacral rocking     Soft tissue mobilization  to left ILA and gluteal     Manual Traction  LAD to left LE     Muscle Energy Technique  sidelying Inflair MET; Shotgun MET supine              PT Education - 07/28/18 0900    Person(s) Educated  Patient    Methods  Explanation;Demonstration;Tactile cues;Verbal cues  Comprehension  Verbalized understanding;Returned demonstration;Verbal cues required;Tactile cues required       PT Short Term Goals - 07/28/18 1605      PT SHORT TERM GOAL #1   Title  Patient will be independent with intial HEP     Baseline  just started perfroming     Time  3    Period  Weeks    Status  On-going      PT SHORT TERM GOAL #2   Title  Patient will repot a reduction in pain to 2/10 at workst when sitting     Baseline  continues to have pain     Time  3    Period  Weeks    Status  Achieved      PT SHORT TERM GOAL #3   Title  Patient will demonstrate full hip ROM without pain    Baseline  Pain with IR/ ER     Time  3    Period  Weeks    Status  On-going               Plan - 07/28/18 1602    Clinical Impression Statement  Pain appears to have moved out of her tailbone an is notw around the left ILA of her sacrum. She reported improved pain with manual therapy. She 3was given a tennis ball for soft tissue work at home. She was also given a butterfly stretch. She was encouraged to continue with her exercises at home.     Clinical  Presentation  Evolving    Clinical Decision Making  Moderate    Rehab Potential  Good    PT Frequency  2x / week    PT Duration  8 weeks    PT Treatment/Interventions  ADLs/Self Care Home Management;Cryotherapy;Electrical Stimulation;Iontophoresis '4mg'$ /ml Dexamethasone;Moist Heat;Ultrasound;DME Instruction;Gait training;Stair training;Therapeutic activities;Therapeutic exercise;Neuromuscular re-education;Cognitive remediation;Patient/family education;Manual techniques;Passive range of motion;Dry needling;Taping    PT Next Visit Plan  assess response to IF ; consider sacral shotgun technique; consider bullterfly stretch and hamstring stretch; light strengthening of the pelvis and gluteals as able. Patient is also having therapy at a different clinic for her mid back. Next visit.: re-assess. Consider 1x a week for 3-4 weeks unless she feels like she can perfrom exercises on her own. Continue with sacral METs. Review pelvic stabilization exercises and advance as tolerated.     PT Home Exercise Plan  figure 4 stretch; pirifromis stretch; lateral trunk rotation; ball squeeze     Consulted and Agree with Plan of Care  Patient       Patient will benefit from skilled therapeutic intervention in order to improve the following deficits and impairments:  Improper body mechanics, Pain, Postural dysfunction, Decreased activity tolerance, Decreased endurance, Decreased range of motion, Difficulty walking  Visit Diagnosis: Chronic bilateral low back pain without sciatica  Abnormal posture     Problem List Patient Active Problem List   Diagnosis Date Noted  . SIRS (systemic inflammatory response syndrome) ([place]) 04/09/2018  . Bipolar disorder ([place]) 04/09/2018  . ADD (attention deficit disorder) 04/09/2018  . Upper respiratory tract infection 08/13/2017    Carney Living  PT DPT  07/28/2018, 4:18 PM  [facility] [address] [place], [place],  [address] Phone: [phone]   Fax:  [phone]  Name: Nancee Brownrigg MRN: [id number] Date of Birth: [birth year]

## 2018-07-28 NOTE — Patient Instructions (Signed)

## 2018-08-04 ENCOUNTER — Encounter: Payer: Medicaid Other | Admitting: Physical Therapy

## 2018-08-13 ENCOUNTER — Encounter: Payer: Self-pay | Admitting: Physical Therapy

## 2018-08-13 ENCOUNTER — Other Ambulatory Visit: Payer: Self-pay

## 2018-08-13 ENCOUNTER — Ambulatory Visit: Payer: Medicaid Other | Attending: Anesthesiology | Admitting: Physical Therapy

## 2018-08-13 DIAGNOSIS — M545 Low back pain: Secondary | ICD-10-CM | POA: Diagnosis not present

## 2018-08-13 DIAGNOSIS — G8929 Other chronic pain: Secondary | ICD-10-CM | POA: Diagnosis present

## 2018-08-13 DIAGNOSIS — R293 Abnormal posture: Secondary | ICD-10-CM

## 2018-08-13 NOTE — Therapy (Addendum)
[place], [place], [address] Phone: [phone]   Fax:  [phone]  Physical Therapy Treatment/ERO/Discharge  Patient Details  Name: Allison Chandler MRN: [id number] Date of Birth: Mar 03, 1991 Referring Provider (PT): Shanon Ace    Encounter Date: 08/13/2018  PT End of Session - 08/13/18 1700    Visit Number  4    Number of Visits  4    Date for PT Re-Evaluation  08/05/18    Authorization Type  Medicaid     Authorization Time Period  07/16/2018-08/05/2018    PT Start Time  1700    PT Stop Time  1738    PT Time Calculation (min)  38 min    Activity Tolerance  Patient tolerated treatment well    Behavior During Therapy  [facility] for tasks assessed/performed       Past Medical History:  Diagnosis Date  . Anxiety   . Back pain, chronic   . Bipolar 1 disorder ([place])   . PCOD (polycystic ovarian disease)     History reviewed. No pertinent surgical history.  There were no vitals filed for this visit.  Subjective Assessment - 08/13/18 1701    Subjective  I feel like the lower part of my tailbone has improved but has moved up to middle of sacrum on- more on Rt side but moves side to side. Has not had pain meds in last couple of days.     How long can you sit comfortably?  30 min    Currently in Pain?  Yes    Pain Score  7     Pain Location  Sacrum    Pain Orientation  Right;Left    Aggravating Factors   sitting, feet go numb sitting  cross leg    Pain Relieving Factors  standing, leaning away from the side that hurts         [facility] PT Assessment - 08/13/18 0001      Assessment   Medical Diagnosis  Tailbone pain     Referring Provider (PT)  Shanon Ace     Onset Date/Surgical Date  --   05/2015   Hand Dominance  Right      Prior Function   Level of Independence  Independent    Vocation  Workers comp    Immunologist   Additional  Comments  numbness in feet with sitting cross-leg      Posture/Postural Control   Posture Comments  forward shift with feet crossed under chair      PROM   Overall PROM Comments  see goals outline      Strength   Overall Strength Comments  5/5 gross bilateral LE strength       Palpation   Palpation comment  TTP bil midline attachment of piriformis      Ambulation/Gait   Gait Comments  [facility]                           PT Education - 08/13/18 1733    Education Details  anatomy of condition, POC, HEP, exercise form/rationale, posture    Person(s) Educated  Patient    Methods  Explanation;Demonstration;Tactile cues;Verbal cues    Comprehension  Verbalized understanding;Need further instruction       PT Short Term Goals - 08/13/18 1705      PT SHORT TERM GOAL #1   Title  Patient  will be independent with intial HEP     Baseline  not doing as regularly as I should, do them at night    Status  Partially Met      PT SHORT TERM GOAL #2   Title  Patient will repot a reduction in pain to 2/10 at workst when sitting     Baseline  7/10    Status  Not Met      PT SHORT TERM GOAL #3   Title  Patient will demonstrate full hip ROM without pain    Baseline  Right: pain with ER+ abduction, pain with compression   Lt: higher in sacrum pain with SLR    Status  On-going      PT SHORT TERM GOAL #4   Title  Pt will be able to sit cross leg for 5 min without numbness in feet    Baseline  unable at eval    Status  New    Target Date  09/11/18               Plan - 08/13/18 1735    Clinical Impression Statement  Pt has made significant improvement in tailbone pain and it has become evident that a tendinosis has formed at midline insertion points of bil piriformis. Pt has limited flexibility and poor postural alignment contributing to continued symptoms. Pt will benefit from skilled PT to address deficits and meet functional goals.     PT Frequency  2x / week    PT  Duration  4 weeks    PT Treatment/Interventions  ADLs/Self Care Home Management;Cryotherapy;Electrical Stimulation;Iontophoresis 45m/ml Dexamethasone;Moist Heat;Ultrasound;DME Instruction;Gait training;Stair training;Therapeutic activities;Therapeutic exercise;Neuromuscular re-education;Cognitive remediation;Patient/family education;Manual techniques;Passive range of motion;Dry needling;Taping    PT Next Visit Plan  consider [place] butterfly stretch, hamstring stretch, glut strength + core activation  *note currently in PT at another clinic for mid back    PT Home Exercise Plan  figure 4 stretch; pirifromis stretch; lateral trunk rotation; ball squeeze; sit with feet flat, ice 3/day 10 min of heat prior AM & PM    Consulted and Agree with Plan of Care  Patient       Patient will benefit from skilled therapeutic intervention in order to improve the following deficits and impairments:  Improper body mechanics, Pain, Postural dysfunction, Decreased activity tolerance, Decreased endurance, Decreased range of motion, Difficulty walking  Visit Diagnosis: Chronic bilateral low back pain without sciatica  Abnormal posture     Problem List Patient Active Problem List   Diagnosis Date Noted  . SIRS (systemic inflammatory response syndrome) ([place] 04/09/2018  . Bipolar disorder ([place] 04/09/2018  . ADD (attention deficit disorder) 04/09/2018  . Upper respiratory tract infection 08/13/2017    Allison Chandler C. Deundra Furber PT, DPT 08/13/18 5:40 PM   [facility] [address]Phone: [phone]  Fax:  [phone] Name: JSheretta GrumbineMRN: [id number] of Birth: [birth year]PHYSICAL THERAPY DISCHARGE SUMMARY  Visits from Start of Care: 4  Current functional level related to goals / functional outcomes: See above   Remaining deficits: See above   Education / Equipment: Anatomy of condition, POC, HEP,exercise  form/rationale  Plan: Patient agrees to discharge.  Patient goals were partially met. Patient is being discharged due to not returning since the last visit.  ?????     [name] PT, DPT 02/03/19 12:57 PM

## 2018-08-25 ENCOUNTER — Ambulatory Visit: Payer: Medicaid Other | Admitting: Physical Therapy

## 2018-08-28 ENCOUNTER — Encounter

## 2018-09-02 ENCOUNTER — Ambulatory Visit: Payer: Medicaid Other | Admitting: Physical Therapy

## 2018-09-03 ENCOUNTER — Ambulatory Visit: Payer: Medicaid Other | Admitting: Physical Therapy

## 2018-09-08 ENCOUNTER — Ambulatory Visit: Payer: Medicaid Other | Admitting: Physical Therapy

## 2018-09-10 ENCOUNTER — Ambulatory Visit: Payer: Medicaid Other | Admitting: Physical Therapy

## 2018-09-15 ENCOUNTER — Encounter: Payer: Medicaid Other | Admitting: Physical Therapy

## 2018-09-17 ENCOUNTER — Encounter: Payer: Medicaid Other | Admitting: Physical Therapy

## 2018-10-06 ENCOUNTER — Telehealth: Payer: Self-pay | Admitting: Physical Therapy

## 2018-10-06 NOTE — Telephone Encounter (Signed)
Allison Chandler was contacted today regarding transition back to in-person OP Rehab Services or the option for  telehealth due to Covid-19.   Pt was unavailable by phone so Voicemail was left asking her to call and schedule an appointment in clinic or request a tele-health visit.

## 2019-02-21 ENCOUNTER — Encounter (HOSPITAL_COMMUNITY): Payer: Self-pay

## 2019-02-21 ENCOUNTER — Emergency Department (HOSPITAL_COMMUNITY)
Admission: EM | Admit: 2019-02-21 | Discharge: 2019-02-22 | Disposition: A | Discharge status: Home / Self Care | Payer: Medicaid Other | Attending: Emergency Medicine | Admitting: Emergency Medicine

## 2019-02-21 ENCOUNTER — Other Ambulatory Visit: Payer: Self-pay

## 2019-02-21 DIAGNOSIS — R21 Rash and other nonspecific skin eruption: Secondary | ICD-10-CM | POA: Diagnosis present

## 2019-02-21 DIAGNOSIS — B029 Zoster without complications: Secondary | ICD-10-CM | POA: Insufficient documentation

## 2019-02-21 DIAGNOSIS — Z79899 Other long term (current) drug therapy: Secondary | ICD-10-CM | POA: Diagnosis not present

## 2019-02-21 DIAGNOSIS — Z87891 Personal history of nicotine dependence: Secondary | ICD-10-CM | POA: Diagnosis not present

## 2019-02-21 NOTE — ED Triage Notes (Signed)
Pt reports blistering rash on upper L abdomen and pain in L arm.

## 2019-02-22 MED ORDER — VALACYCLOVIR HCL 1 G PO TABS: 1000.0000 mg | ORAL_TABLET | Freq: Three times a day (TID) | ORAL | 0 refills | Status: DC

## 2019-02-22 NOTE — ED Notes (Signed)
Pt declined dc vitals.

## 2019-02-22 NOTE — ED Provider Notes (Signed)
[name] Provider Note   CSN: [id number] Arrival date & time: 02/21/19  2118     History   Chief Complaint Chief Complaint  Patient presents with  . Rash    HPI Allison Chandler is a 28 y.o. female.     Patient to ED for evaluation of painful rash that developed 2 days ago to left side abdomen. No fever, drainage. No injury to the area.   The history is provided by the patient. No language interpreter was used.    Past Medical History:  Diagnosis Date  . Anxiety   . Back pain, chronic   . Bipolar 1 disorder ([place])   . PCOD (polycystic ovarian disease)     Patient Active Problem List   Diagnosis Date Noted  . SIRS (systemic inflammatory response syndrome) ([place]) 04/09/2018  . Bipolar disorder ([place]) 04/09/2018  . ADD (attention deficit disorder) 04/09/2018  . Upper respiratory tract infection 08/13/2017    History reviewed. No pertinent surgical history.   OB History    Gravida  1   Para  1   Term  1   Preterm      AB      Living  1     SAB      TAB      Ectopic      Multiple      Live Births  1            Home Medications    Prior to Admission medications   Medication Sig Start Date End Date Taking? Authorizing Provider  acetaminophen (TYLENOL) 500 MG tablet Take 1,000 mg by mouth. Every 6 to 8 hours as needed for pain    [provider]  atomoxetine (STRATTERA) 40 MG capsule Take 40 mg by mouth daily. 01/14/18   [provider]  busPIRone (BUSPAR) 5 MG tablet Take 5 mg by mouth 3 (three) times daily. 02/09/18   [provider]  cariprazine (VRAYLAR) capsule Take 3 mg by mouth daily. 01/14/18   [provider]  CHANTIX STARTING MONTH PAK 0.5 MG X 11 & 1 MG X 42 tablet Take 1 tablet by mouth as directed. 03/09/18   [provider]  gabapentin (NEURONTIN) 400 MG capsule Take 400 mg by mouth 3 (three) times daily. 01/14/18   [provider]  hydrOXYzine  (ATARAX/VISTARIL) 25 MG tablet Take 25 mg by mouth 3 (three) times daily as needed for anxiety. 01/14/18   [provider]  rOPINIRole (REQUIP) 0.25 MG tablet Take 0.25 mg by mouth daily as needed (restless legs).    [provider]    Family History Family History  Problem Relation Age of Onset  . Hypertension Mother   . Ulcerative colitis Mother     Social History Social History   Tobacco Use  . Smoking status: Former Smoker    Types: Cigarettes  . Smokeless tobacco: Never Used  Substance Use Topics  . Alcohol use: Yes    Comment: occasionally  . Drug use: No     Allergies   Wellbutrin [bupropion]   Review of Systems Review of Systems  Constitutional: Negative for chills and fever.  Gastrointestinal: Positive for abdominal pain (associated with rash). Negative for nausea.  Skin: Positive for rash.     Physical Exam Updated Vital Signs BP (!) 159/107 (BP Location: Left Arm)   Pulse 98   Temp 99.6 F (37.6 C) (Oral)   Resp 16   SpO2 98%  Physical Exam Vitals signs and nursing note reviewed.  Constitutional:      Appearance: She is well-developed.  Neck:     Musculoskeletal: Normal range of motion.  Pulmonary:     Effort: Pulmonary effort is normal.  Skin:    General: Skin is warm and dry.     Comments: Grouped vesicles with erythema to LUQ abdomen c/w shingles.  Neurological:     Mental Status: She is alert and oriented to person, place, and time.      ED Treatments / Results  Labs (all labs ordered are listed, but only abnormal results are displayed) Labs Reviewed - No data to display  EKG None  Radiology No results found.  Procedures Procedures (including critical care time)  Medications Ordered in ED Medications - No data to display   Initial Impression / Assessment and Plan / ED Course  I have reviewed the triage vital signs and the nursing notes.  Pertinent labs & imaging results that were available during my  care of the patient were reviewed by me and considered in my medical decision making (see chart for details).        Patient to ED with painful rash, found to have shingles.   Valacyclovir started. She is taking regular pain medications on a pain management contract and does not need other pain control.   Final Clinical Impressions(s) / ED Diagnoses   Final diagnoses:  None   1. Shingles   ED Discharge Orders    None       Charlann Lange, PA-C 02/22/19 [place], Delice Bison, DO 02/22/19 [phone]

## 2019-03-31 IMAGING — RF DG FLUORO GUIDE LUMBAR PUNCTURE
2 series · 2 of 2 positions shown · non-contrast
Comparison: none

CLINICAL DATA: Fever, headache, nausea.

[Series 1: cp_standard · 0.29mm/px · 1 of 1 slices shown (1 of 2)]
[im 1/1]
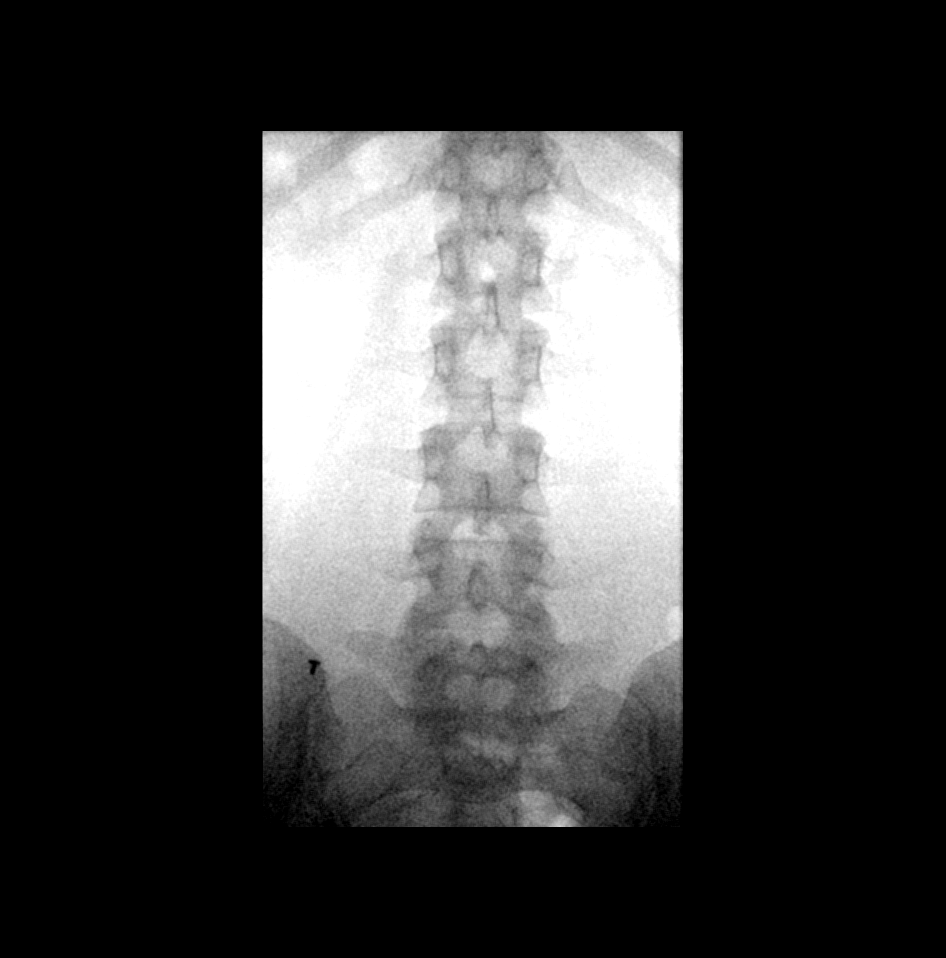

[Series 2: cp_standard · 0.27mm/px · 1 of 1 slices shown (2 of 2)]
[im 1/1]
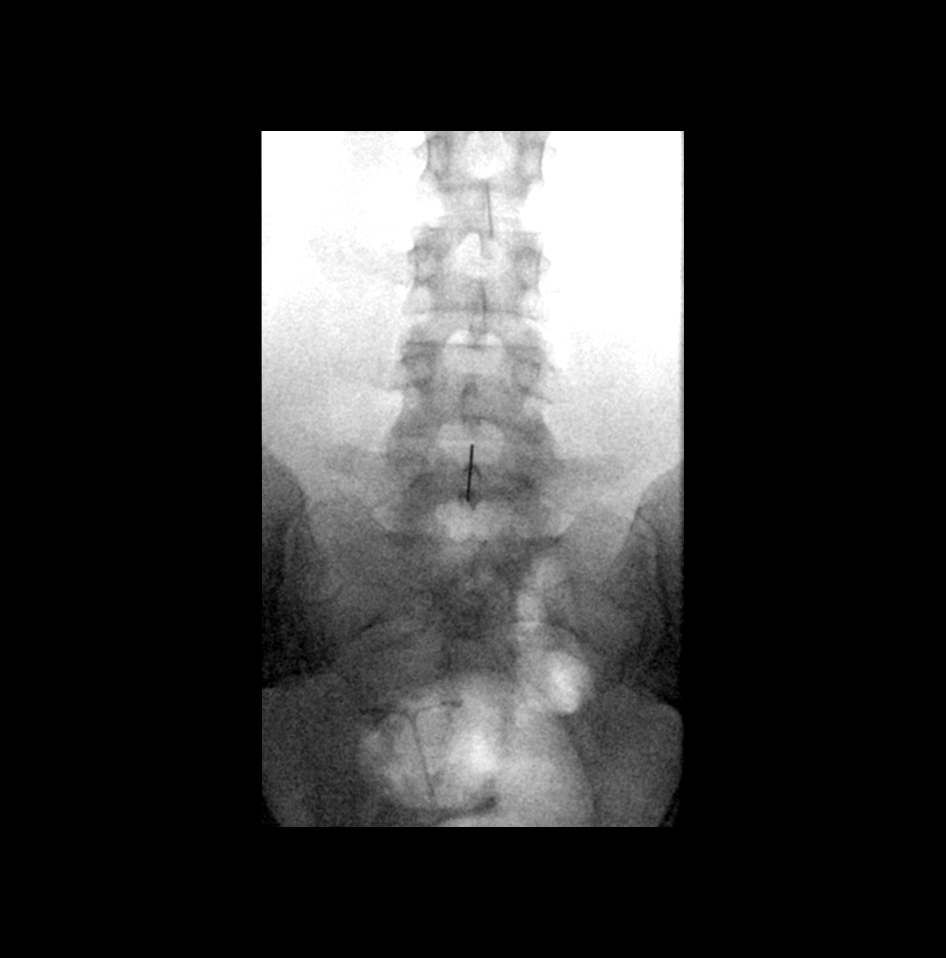

[2 of 2 positions shown; findings below may reference images not displayed]

EXAM:
DIAGNOSTIC LUMBAR PUNCTURE UNDER FLUOROSCOPIC GUIDANCE

FLUOROSCOPY TIME:  Fluoroscopy Time:  0 minutes 27 seconds

Radiation Exposure Index (if provided by the fluoroscopic device):
4.70 mGy

Number of Acquired Spot Images: 0

PROCEDURE:
Informed consent was obtained from the patient prior to the
procedure, including potential complications of headache, allergy,
and pain. With the patient prone, the lower back was prepped with
Betadine. 1% Lidocaine was used for local anesthesia. Lumbar
puncture was performed at the L4-5 level using a 20 gauge needle
with return of clear, colorless CSF with an opening pressure of 27
cm water. 13 ml of CSF were obtained for laboratory studies. The
patient tolerated the procedure well and there were no apparent
complications.
IMPRESSION: Successful fluoroscopic guided lumbar puncture.

## 2019-03-31 IMAGING — CR DG CHEST 2V
2 series · 2 of 2 positions shown · non-contrast
Comparison: 07/09/2017

CLINICAL DATA: Shortness of breath, chest congestion, weakness for
past week, much worse tonight

EXAM:
CHEST - 2 VIEW

[w chest pa]
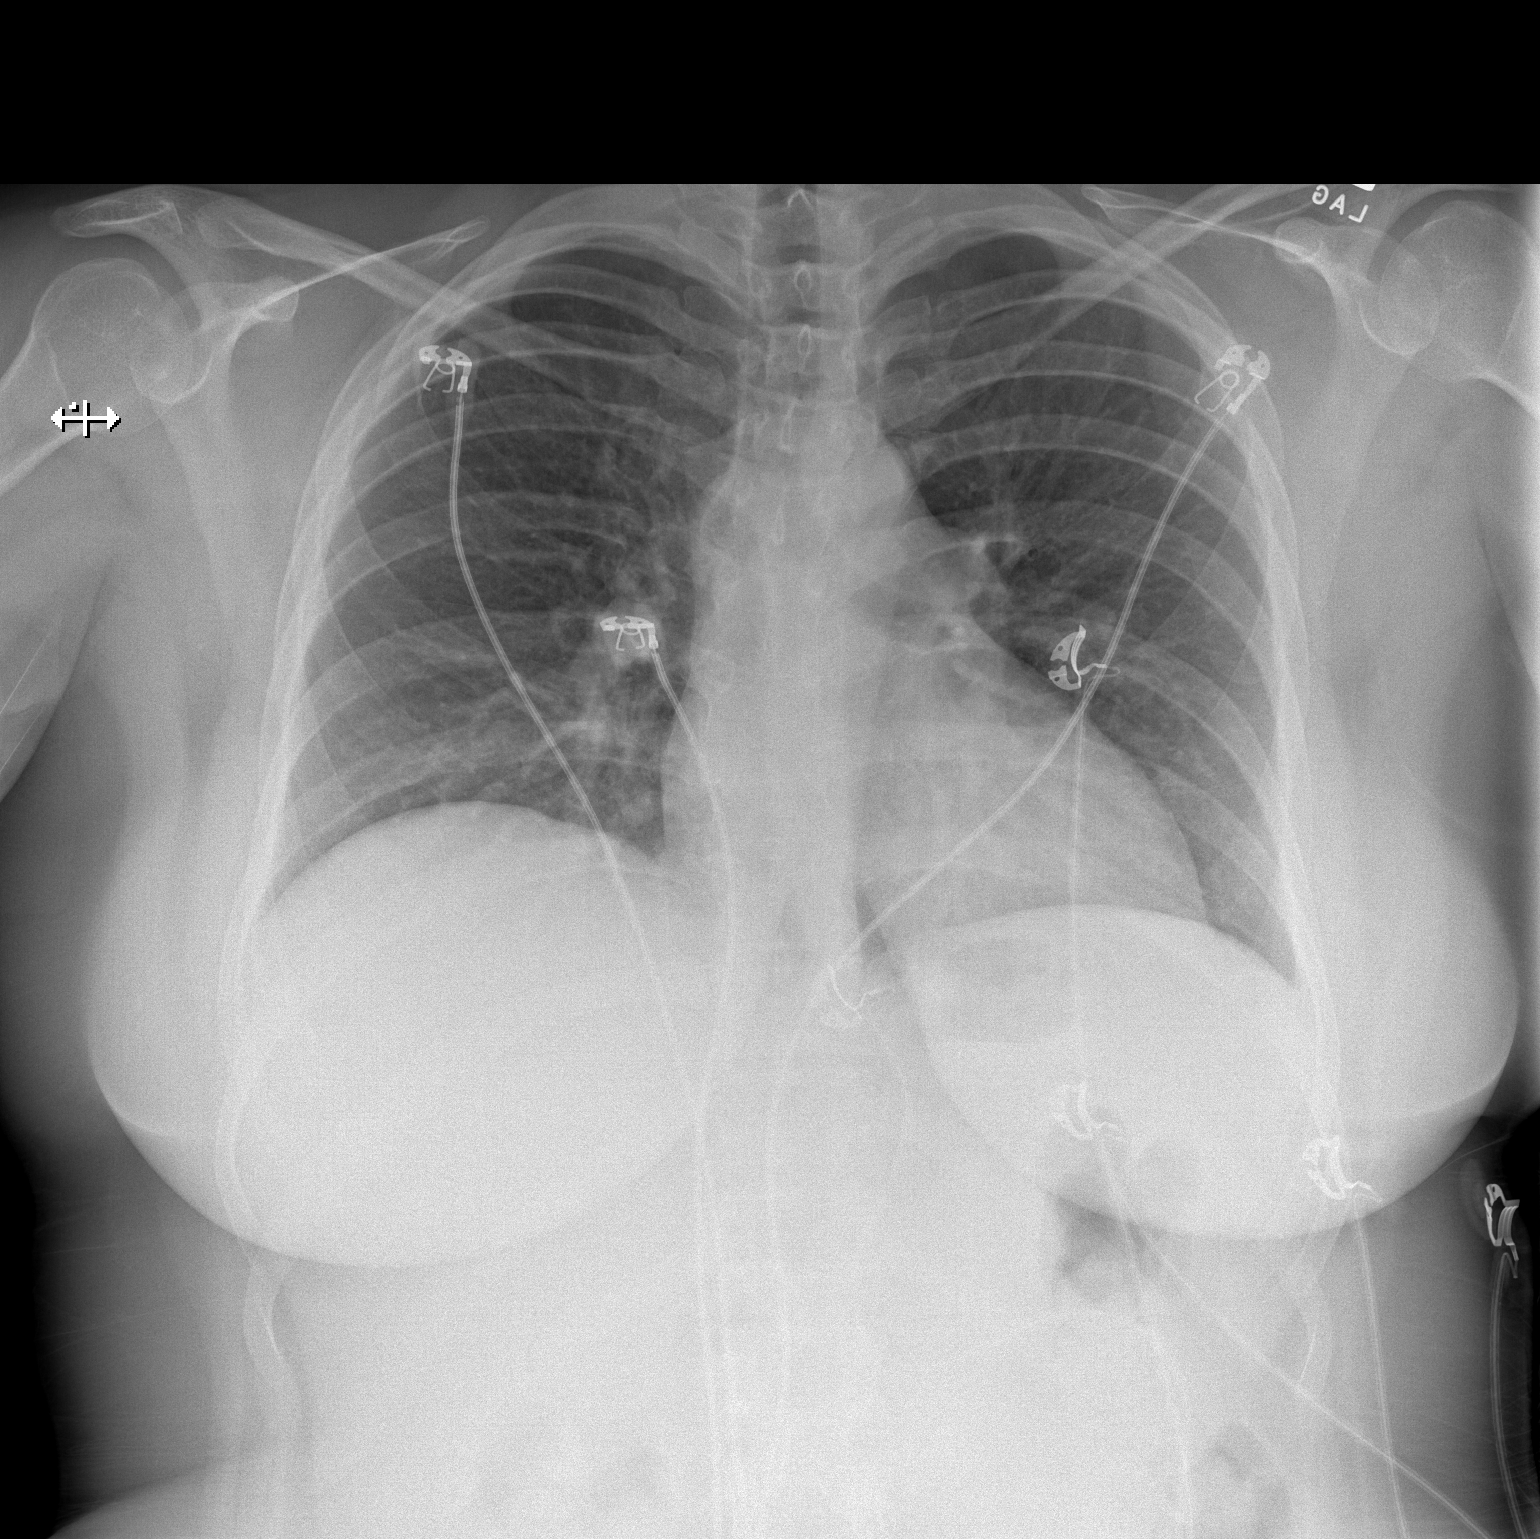

[w chest lat]
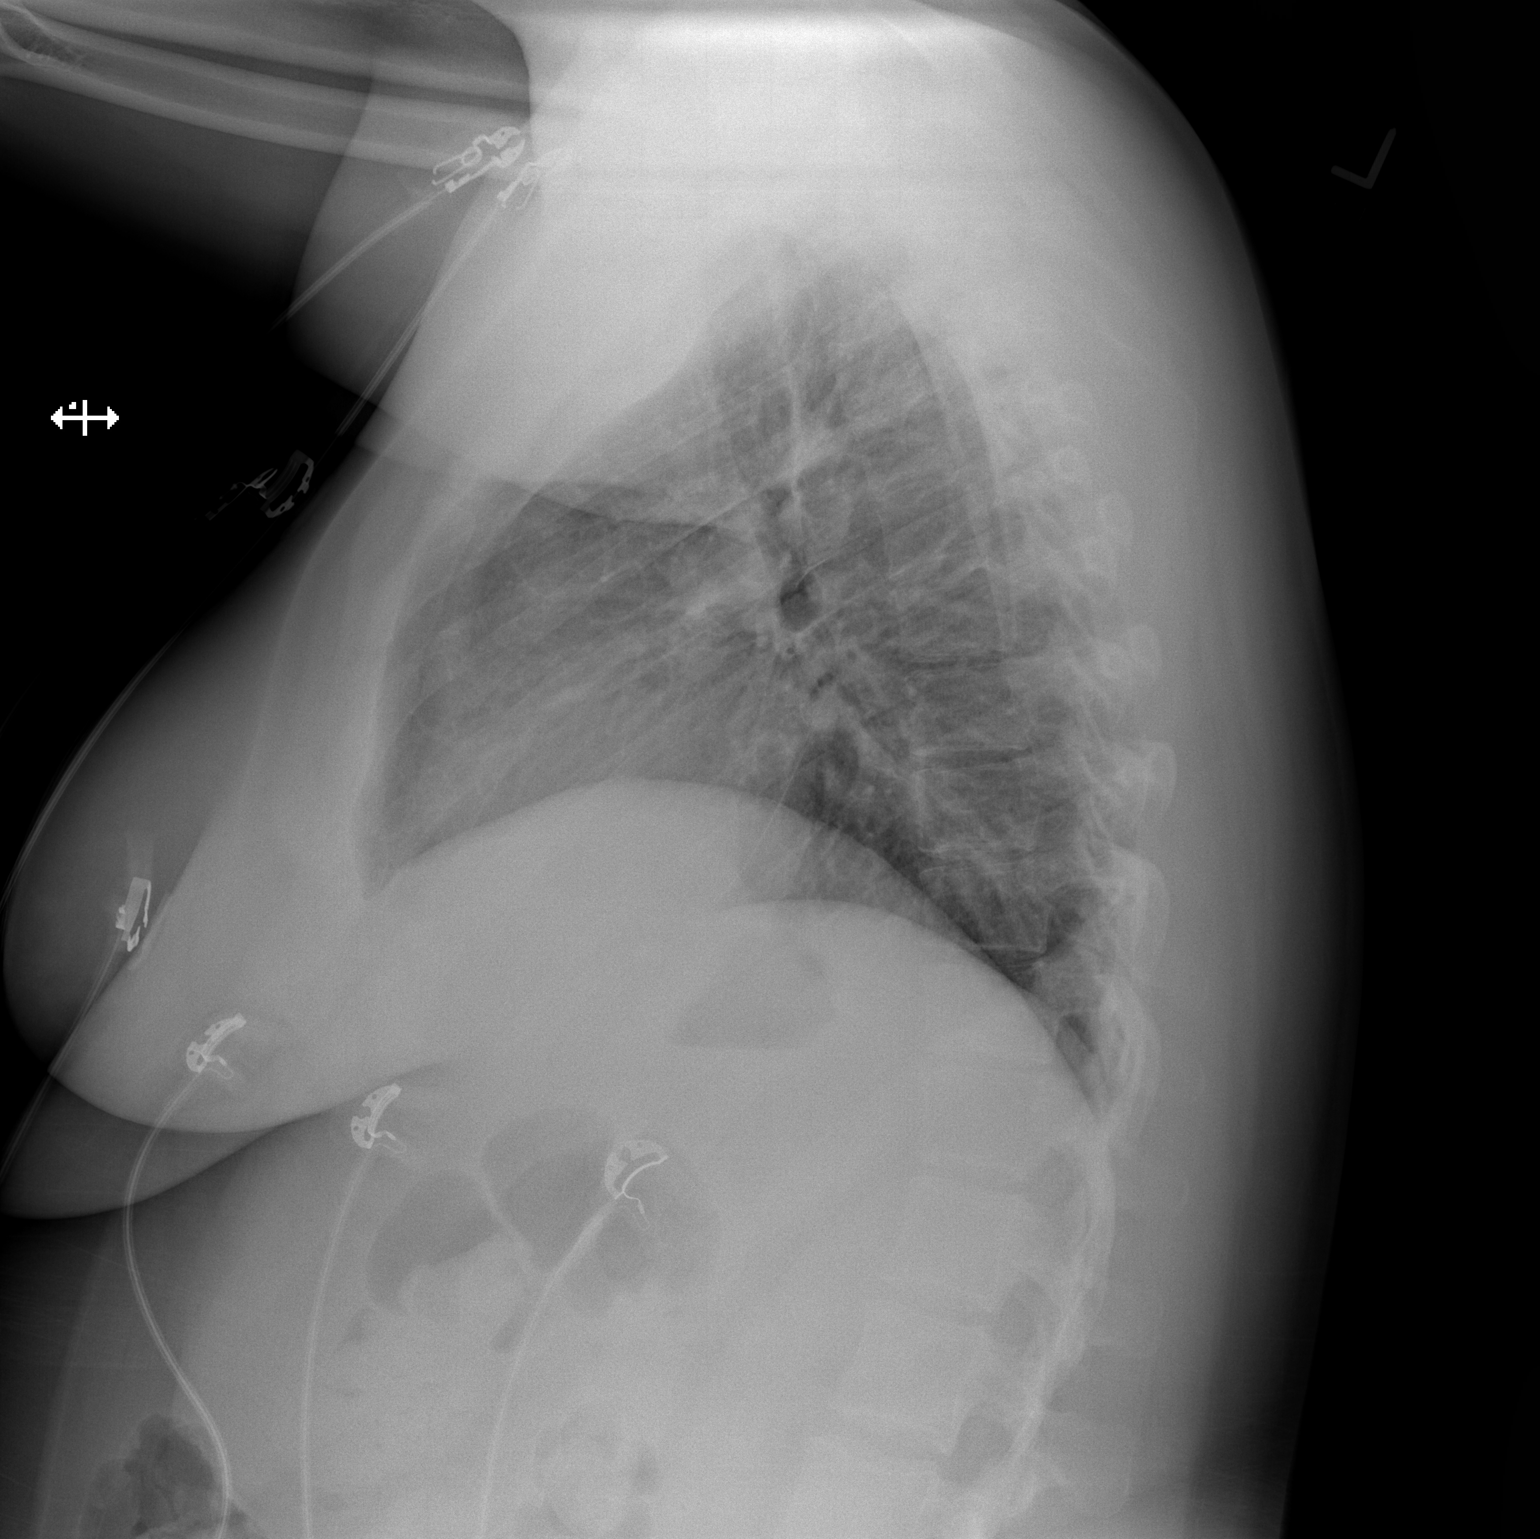

[2 of 2 positions shown; findings below may reference images not displayed]

FINDINGS: The heart size and mediastinal contours are within normal limits.
Both lungs are clear. The visualized skeletal structures are
unremarkable.
IMPRESSION: No active cardiopulmonary disease.

## 2019-08-10 ENCOUNTER — Encounter (HOSPITAL_COMMUNITY): Payer: Self-pay | Admitting: Emergency Medicine

## 2019-08-10 ENCOUNTER — Emergency Department (HOSPITAL_COMMUNITY): Payer: BC Managed Care – PPO

## 2019-08-10 ENCOUNTER — Other Ambulatory Visit: Payer: Self-pay

## 2019-08-10 ENCOUNTER — Emergency Department (HOSPITAL_COMMUNITY)
Admission: EM | Admit: 2019-08-10 | Discharge: 2019-08-10 | Disposition: A | Discharge status: Home / Self Care | Payer: BC Managed Care – PPO | Attending: Emergency Medicine | Admitting: Emergency Medicine

## 2019-08-10 DIAGNOSIS — R1013 Epigastric pain: Secondary | ICD-10-CM | POA: Insufficient documentation

## 2019-08-10 DIAGNOSIS — R1084 Generalized abdominal pain: Secondary | ICD-10-CM

## 2019-08-10 DIAGNOSIS — Z79899 Other long term (current) drug therapy: Secondary | ICD-10-CM | POA: Diagnosis not present

## 2019-08-10 DIAGNOSIS — Z87891 Personal history of nicotine dependence: Secondary | ICD-10-CM | POA: Insufficient documentation

## 2019-08-10 DIAGNOSIS — R1010 Upper abdominal pain, unspecified: Secondary | ICD-10-CM | POA: Diagnosis present

## 2019-08-10 DIAGNOSIS — R1012 Left upper quadrant pain: Secondary | ICD-10-CM | POA: Diagnosis not present

## 2019-08-10 DIAGNOSIS — K625 Hemorrhage of anus and rectum: Secondary | ICD-10-CM

## 2019-08-10 LAB — URINALYSIS, ROUTINE W REFLEX MICROSCOPIC
Bacteria, UA: NONE SEEN
Bilirubin Urine: NEGATIVE
Glucose, UA: NEGATIVE mg/dL
Hgb urine dipstick: NEGATIVE
Ketones, ur: NEGATIVE mg/dL
Nitrite: NEGATIVE
Protein, ur: NEGATIVE mg/dL
Specific Gravity, Urine: 1.023 (ref 1.005–1.030)
pH: 5 (ref 5.0–8.0)

## 2019-08-10 LAB — COMPREHENSIVE METABOLIC PANEL
ALT: 37 U/L (ref 0–44)
AST: 26 U/L (ref 15–41)
Albumin: 4.1 g/dL (ref 3.5–5.0)
Alkaline Phosphatase: 50 U/L (ref 38–126)
Anion gap: 8 (ref 5–15)
BUN: 13 mg/dL (ref 6–20)
CO2: 22 mmol/L (ref 22–32)
Calcium: 9.2 mg/dL (ref 8.9–10.3)
Chloride: 107 mmol/L (ref 98–111)
Creatinine, Ser: 0.55 mg/dL (ref 0.44–1.00)
GFR calc Af Amer: >60 mL/min (ref >60)
GFR calc non Af Amer: >60 mL/min (ref >60)
Glucose, Bld: 124 mg/dL — ABNORMAL HIGH (ref 70–99)
Potassium: 3.9 mmol/L (ref 3.5–5.1)
Sodium: 137 mmol/L (ref 135–145)
Total Bilirubin: 0.3 mg/dL (ref 0.3–1.2)
Total Protein: 7.8 g/dL (ref 6.5–8.1)

## 2019-08-10 LAB — LIPASE, BLOOD: Lipase: 26 U/L (ref 11–51)

## 2019-08-10 LAB — CBC
HCT: 39.6 % (ref 36.0–46.0)
Hemoglobin: 12.4 g/dL (ref 12.0–15.0)
MCH: 27.2 pg (ref 26.0–34.0)
MCHC: 31.3 g/dL (ref 30.0–36.0)
MCV: 86.8 fL (ref 80.0–100.0)
Platelets: 294 10*3/uL (ref 150–400)
RBC: 4.56 MIL/uL (ref 3.87–5.11)
RDW: 13.6 % (ref 11.5–15.5)
WBC: 10.2 10*3/uL (ref 4.0–10.5)
nRBC: 0 % (ref 0.0–0.2)

## 2019-08-10 LAB — POC OCCULT BLOOD, ED: Fecal Occult Bld: POSITIVE — AB

## 2019-08-10 LAB — HCG, QUANTITATIVE, PREGNANCY: hCG, Beta Chain, Quant, S: <1 m[IU]/mL (ref <5)

## 2019-08-10 MED ORDER — SODIUM CHLORIDE (PF) 0.9 % IJ SOLN
INTRAMUSCULAR | Status: AC
  Filled 2019-08-10: qty 50

## 2019-08-10 MED ORDER — LIDOCAINE VISCOUS HCL 2 % MT SOLN
15.0000 mL | Freq: Once | OROMUCOSAL | Status: AC
  Administered 2019-08-10: 15 mL via ORAL
  Filled 2019-08-10: qty 15

## 2019-08-10 MED ORDER — ONDANSETRON HCL 4 MG/2ML IJ SOLN
4.0000 mg | Freq: Once | INTRAMUSCULAR | Status: AC
  Administered 2019-08-10: 4 mg via INTRAVENOUS
  Filled 2019-08-10: qty 2

## 2019-08-10 MED ORDER — SODIUM CHLORIDE 0.9% FLUSH
3.0000 mL | Freq: Once | INTRAVENOUS | Status: AC
  Administered 2019-08-10: 3 mL via INTRAVENOUS

## 2019-08-10 MED ORDER — IOHEXOL 300 MG/ML  SOLN
100.0000 mL | Freq: Once | INTRAMUSCULAR | Status: AC | PRN
  Administered 2019-08-10: 100 mL via INTRAVENOUS

## 2019-08-10 MED ORDER — ALUM & MAG HYDROXIDE-SIMETH 200-200-20 MG/5ML PO SUSP
30.0000 mL | Freq: Once | ORAL | Status: AC
  Administered 2019-08-10: 30 mL via ORAL
  Filled 2019-08-10: qty 30

## 2019-08-10 MED ORDER — MORPHINE SULFATE (PF) 4 MG/ML IV SOLN
4.0000 mg | Freq: Once | INTRAVENOUS | Status: AC
  Administered 2019-08-10: 4 mg via INTRAVENOUS
  Filled 2019-08-10: qty 1

## 2019-08-10 MED ORDER — SODIUM CHLORIDE 0.9 % IV BOLUS
1000.0000 mL | Freq: Once | INTRAVENOUS | Status: AC
  Administered 2019-08-10: 1000 mL via INTRAVENOUS

## 2019-08-10 MED ORDER — ONDANSETRON 4 MG PO TBDP: 4.0000 mg | ORAL_TABLET | Freq: Once | ORAL | Status: DC | PRN

## 2019-08-10 NOTE — ED Provider Notes (Signed)
[facility] DEPT Provider Note   CSN: [id number] Arrival date & time: 08/10/19  0011     History Chief Complaint  Patient presents with  . Abdominal Pain    Wednesday Allison Chandler is a 29 y.o. female past medical history anxiety, back pain, bipolar, PCOD resents for evaluation of upper abdominal pain that began about 6 PM this evening.  She reports that symptoms began after eating dinner.  She denies any abnormal foods.  She reports that the other members who ate the food did not have any symptoms.  She describes her pain as a tightening type pain in the upper abdomen that radiates to left upper quadrant.  She has not had any associated nausea/vomiting.  Patient states she was fine today prior to onset of her symptoms.  She does report that she has felt more fatigue since getting her COVID-19 vaccine 4 days ago.  She has not taken any medications for the symptoms.  She does report that earlier today, she had a bowel movement and had to strain had some bright red blood.  She states that this is typical for her and she has had this before.  Patient denies any fever, chest pain, difficulty breathing, dysuria, hematuria, vaginal bleeding, vaginal discharge.  She does not get periods secondary to IUD that is placed.  Patient does report she smokes a pack of cigarettes a day.  No alcohol use.  The history is provided by the patient.       Past Medical History:  Diagnosis Date  . Anxiety   . Back pain, chronic   . Bipolar 1 disorder ([place])   . PCOD (polycystic ovarian disease)     Patient Active Problem List   Diagnosis Date Noted  . SIRS (systemic inflammatory response syndrome) ([place]) 04/09/2018  . Bipolar disorder ([place]) 04/09/2018  . ADD (attention deficit disorder) 04/09/2018  . Upper respiratory tract infection 08/13/2017    History reviewed. No pertinent surgical history.   OB History    Gravida  1   Para  1   Term  1   Preterm      AB      Living    1     SAB      TAB      Ectopic      Multiple      Live Births  1           Family History  Problem Relation Age of Onset  . Hypertension Mother   . Ulcerative colitis Mother     Social History   Tobacco Use  . Smoking status: Former Smoker    Types: Cigarettes  . Smokeless tobacco: Never Used  Substance Use Topics  . Alcohol use: Yes    Comment: occasionally  . Drug use: No    Home Medications Prior to Admission medications   Medication Sig Start Date End Date Taking? Authorizing Provider  acetaminophen (TYLENOL) 500 MG tablet Take 1,000 mg by mouth. Every 6 to 8 hours as needed for pain    [provider]  atomoxetine (STRATTERA) 40 MG capsule Take 40 mg by mouth daily. 01/14/18   [provider]  busPIRone (BUSPAR) 5 MG tablet Take 5 mg by mouth 3 (three) times daily. 02/09/18   [provider]  cariprazine (VRAYLAR) capsule Take 3 mg by mouth daily. 01/14/18   [provider]  CHANTIX STARTING MONTH PAK 0.5 MG X 11 & 1 MG X 42 tablet Take  1 tablet by mouth as directed. 03/09/18   [provider]  gabapentin (NEURONTIN) 400 MG capsule Take 400 mg by mouth 3 (three) times daily. 01/14/18   [provider]  hydrOXYzine (ATARAX/VISTARIL) 25 MG tablet Take 25 mg by mouth 3 (three) times daily as needed for anxiety. 01/14/18   [provider]  rOPINIRole (REQUIP) 0.25 MG tablet Take 0.25 mg by mouth daily as needed (restless legs).    [provider]  valACYclovir (VALTREX) 1000 MG tablet Take 1 tablet (1,000 mg total) by mouth 3 (three) times daily. 02/22/19   Charlann Lange, PA-C    Allergies    Wellbutrin [bupropion]  Review of Systems   Review of Systems  Constitutional: Negative for fever.  Respiratory: Negative for cough and shortness of breath.   Cardiovascular: Negative for chest pain.  Gastrointestinal: Positive for abdominal pain and blood in stool. Negative for nausea and vomiting.   Genitourinary: Negative for dysuria and hematuria.  Neurological: Negative for headaches.  All other systems reviewed and are negative.   Physical Exam Updated Vital Signs BP 119/81   Pulse 81   Temp 98.1 F (36.7 C) (Oral)   Resp 17   Ht 5\' 2"  (1.575 m)   Wt 104.3 kg   SpO2 100%   BMI 42.07 kg/m   Physical Exam Vitals and nursing note reviewed. Exam conducted with a chaperone present.  Constitutional:      Appearance: Normal appearance. She is well-developed.  HENT:     Head: Normocephalic and atraumatic.  Eyes:     General: Lids are normal.     Conjunctiva/sclera: Conjunctivae normal.     Pupils: Pupils are equal, round, and reactive to light.  Cardiovascular:     Rate and Rhythm: Normal rate and regular rhythm.     Pulses: Normal pulses.     Heart sounds: Normal heart sounds. No murmur. No friction rub. No gallop.   Pulmonary:     Effort: Pulmonary effort is normal.     Breath sounds: Normal breath sounds.     Comments: Lungs clear to auscultation bilaterally.  Symmetric chest rise.  No wheezing, rales, rhonchi. Abdominal:     Palpations: Abdomen is soft. Abdomen is not rigid.     Tenderness: There is abdominal tenderness in the epigastric area and left upper quadrant. There is no right CVA tenderness, left CVA tenderness or guarding.     Comments: Abdomen soft, nondistended.  Tenderness palpation in epigastric and left upper quadrant region.  No CVA tenderness noted bilaterally.  Genitourinary:    Comments: The exam was performed with a chaperone present. Digital Rectal Exam reveals sphincter with good tone. No external hemorrhoids. No masses or fissures. Stool color is brown with no overt blood. Musculoskeletal:        General: Normal range of motion.     Cervical back: Full passive range of motion without pain.  Skin:    General: Skin is warm and dry.     Capillary Refill: Capillary refill takes less than 2 seconds.  Neurological:     Mental Status: She is  alert and oriented to person, place, and time.  Psychiatric:        Speech: Speech normal.     ED Results / Procedures / Treatments   Labs (all labs ordered are listed, but only abnormal results are displayed) Labs Reviewed  COMPREHENSIVE METABOLIC PANEL - Abnormal; Notable for the following components:      Result Value   Glucose,  Bld 124 (*)    All other components within normal limits  URINALYSIS, ROUTINE W REFLEX MICROSCOPIC - Abnormal; Notable for the following components:   Leukocytes,Ua TRACE (*)    All other components within normal limits  POC OCCULT BLOOD, ED - Abnormal; Notable for the following components:   Fecal Occult Bld POSITIVE (*)    All other components within normal limits  LIPASE, BLOOD  CBC  HCG, QUANTITATIVE, PREGNANCY  I-STAT BETA HCG BLOOD, ED ([facility], [facility], [facility] ONLY)    EKG None  Radiology CT ABDOMEN PELVIS W CONTRAST  Result Date: 08/10/2019 CLINICAL DATA:  Unspecified abdominal pain EXAM: CT ABDOMEN AND PELVIS WITH CONTRAST TECHNIQUE: Multidetector CT imaging of the abdomen and pelvis was performed using the standard protocol following bolus administration of intravenous contrast. CONTRAST:  127mL OMNIPAQUE IOHEXOL 300 MG/ML  SOLN COMPARISON:  03/03/2018 FINDINGS: Lower chest:  No contributory findings. Hepatobiliary: Hepatic steatosis.No evidence of biliary obstruction or stone. Pancreas: Unremarkable. Spleen: Unremarkable. Adrenals/Urinary Tract: Negative adrenals. No hydronephrosis or stone. Unremarkable bladder. Stomach/Bowel: No obstruction. No appendicitis. Moderate colonic gas and stool. Vascular/Lymphatic: No acute vascular abnormality. No mass or adenopathy. Reproductive:IUD in expected position. History of PCOS but currently normal appearance of the bilateral ovaries. Other: No ascites or pneumoperitoneum. Musculoskeletal: No acute abnormalities. IMPRESSION: 1. No acute finding. 2. Hepatic steatosis. Electronically Signed   By: Monte Fantasia M.D.   On:  08/10/2019 04:39    Procedures Procedures (including critical care time)  Medications Ordered in ED Medications  ondansetron (ZOFRAN-ODT) disintegrating tablet 4 mg (has no administration in time range)  sodium chloride (PF) 0.9 % injection (has no administration in time range)  sodium chloride flush (NS) 0.9 % injection 3 mL (3 mLs Intravenous Given 08/10/19 0143)  sodium chloride 0.9 % bolus 1,000 mL (0 mLs Intravenous Stopped 08/10/19 0219)  ondansetron (ZOFRAN) injection 4 mg (4 mg Intravenous Given 08/10/19 0142)  morphine 4 MG/ML injection 4 mg (4 mg Intravenous Given 08/10/19 0142)  alum & mag hydroxide-simeth (MAALOX/MYLANTA) 200-200-20 MG/5ML suspension 30 mL (30 mLs Oral Given 08/10/19 0215)    And  lidocaine (XYLOCAINE) 2 % viscous mouth solution 15 mL (15 mLs Oral Given 08/10/19 0215)  iohexol (OMNIPAQUE) 300 MG/ML solution 100 mL (100 mLs Intravenous Contrast Given 08/10/19 0346)    ED Course  I have reviewed the triage vital signs and the nursing notes.  Pertinent labs & imaging results that were available during my care of the patient were reviewed by me and considered in my medical decision making (see chart for details).    MDM Rules/Calculators/A&P                      29 year old female who presents for evaluation of abdominal pain that began today.  Pain began after dinner.  No fevers, vomiting.  No chest pain, difficulty breathing, urinary complaints, vaginal complaints.  Does report an episode of bowel movement by red blood which she states is typical for her.  She is on chronic pain medication secondary to low back pain.  She does report that she will have to strain for bowel movements and states that normally she will have some blood with straining.  Patient is afebrile, non-toxic appearing, sitting comfortably on examination table. Vital signs reviewed and stable.  On exam, she has some tenderness palpation noted upper abdomen and left upper quadrant.  Consider PUD versus  gastritis.  History/physical exam not concerning for PID, appendicitis.  Additionally, do not suspect  GI bleed as she states that this has been occurring before and she does have to strain given her history of chronic pain medication.  I suspect this may be due to internal hemorrhoids.  Low suspicion for diverticulitis but also consideration.  Labs ordered at triage.  CBC shows no leukocytosis or anemia.  CMP is unremarkable.  UA shows trace leukocytes.  Lipase is unremarkable.  She is fecal occult positive.  I do not know any bright red blood noted on my exam but given that she was fecal occult positive, will plan to do CT scan to ensure there is not diverticulitis that would be causing her pain.  CT on pelvis shows no acute findings.  No evidence of acute abnormalities.  Reevaluation.  Patient is sleeping comfortably.  No signs of distress.  Patient reports feeling better.  Repeat abdominal exam is improved.  We will plan to p.o. challenge patient.  Patient able to tolerate p.o. without any difficulty.  Patient is hemodynamically stable here in the ED.  Her hemoglobin is stable.  At this time, she is not having active rectal bleeding.  Additionally, this is been an issue that has happened before.  We will plan to give her GI follow-up.  I suspect at this time, her bleeding is secondary due to internal hemorrhoids. At this time, patient exhibits no emergent life-threatening condition that require further evaluation in ED or admission. Patient had ample opportunity for questions and discussion. All patient's questions were answered with full understanding. Strict return precautions discussed. Patient expresses understanding and agreement to plan.   Portions of this note were generated with [employer]. Dictation errors may occur despite best attempts at proofreading.   Final Clinical Impression(s) / ED Diagnoses Final diagnoses:  Generalized abdominal pain  Rectal bleeding    Rx / DC  Orders ED Discharge Orders    None       Volanda Napoleon, PA-C 08/10/19 [id number]    Fatima Blank, MD 08/10/19 [phone]

## 2019-08-10 NOTE — ED Notes (Signed)
Pt did not provide enough urine to culture.

## 2019-08-10 NOTE — ED Triage Notes (Signed)
Patient complaining of abdominal pain that started a few hours ago. Patient states it feels hard and tight.

## 2019-08-10 NOTE — Discharge Instructions (Addendum)
As we discussed, your work-up was reassuring today.  He did have some bleeding noted on your rectal exam.  But your hemoglobin levels are stable and your CT scan was unremarkable.  This is most likely caused by internal hemorrhoids.  Follow-up with referred GI doctor for further evaluation.  Return to the Emergency Department immediately if you experience any worsening abdominal pain, fever, persistent nausea and vomiting, inability keep any food down, pain with urination, blood in your urine or any other worsening or concerning symptoms.

## 2019-08-10 NOTE — ED Notes (Signed)
Patient tolerating ginger ale with no difficulty

## 2019-08-27 ENCOUNTER — Encounter: Payer: Self-pay | Admitting: Gastroenterology

## 2019-09-10 ENCOUNTER — Other Ambulatory Visit: Payer: Self-pay

## 2019-09-10 ENCOUNTER — Ambulatory Visit (INDEPENDENT_AMBULATORY_CARE_PROVIDER_SITE_OTHER): Payer: BC Managed Care – PPO | Admitting: Gastroenterology

## 2019-09-10 ENCOUNTER — Encounter: Payer: Self-pay | Admitting: Gastroenterology

## 2019-09-10 VITALS — BP 140/80 | HR 80 | Temp 98.5°F | Ht 63.0 in | Wt 236.2 lb

## 2019-09-10 DIAGNOSIS — R1011 Right upper quadrant pain: Secondary | ICD-10-CM | POA: Diagnosis not present

## 2019-09-10 DIAGNOSIS — K625 Hemorrhage of anus and rectum: Secondary | ICD-10-CM | POA: Diagnosis not present

## 2019-09-10 NOTE — Patient Instructions (Addendum)
If you are age 29 or older, your body mass index should be between 23-30. Your Body mass index is 41.85 kg/m. If this is out of the aforementioned range listed, please consider follow up with your Primary Care Provider.  If you are age 40 or younger, your body mass index should be between 19-25. Your Body mass index is 41.85 kg/m. If this is out of the aformentioned range listed, please consider follow up with your Primary Care Provider.    You have been scheduled for a HIDA scan at [facility] Radiology (1st floor) on 09/20/2019. Please arrive 15 minutes prior to your scheduled appointment at  [id number] am. Make certain not to have anything to eat or drink at least 6 hours prior to your test. Should this appointment date or time not work well for you, please call radiology scheduling at [phone].  _____________________________________________________________________ hepatobiliary (HIDA) scan is an imaging procedure used to diagnose problems in the liver, gallbladder and bile ducts. In the HIDA scan, a radioactive chemical or tracer is injected into a vein in your arm. The tracer is handled by the liver like bile. Bile is a fluid produced and excreted by your liver that helps your digestive system break down fats in the foods you eat. Bile is stored in your gallbladder and the gallbladder releases the bile when you eat a meal. A special nuclear medicine scanner (gamma camera) tracks the flow of the tracer from your liver into your gallbladder and small intestine.  During your HIDA scan  You'll be asked to change into a hospital gown before your HIDA scan begins. Your health care team will position you on a table, usually on your back. The radioactive tracer is then injected into a vein in your arm.The tracer travels through your bloodstream to your liver, where it's taken up by the bile-producing cells. The radioactive tracer travels with the bile from your liver into your gallbladder and through your bile  ducts to your small intestine.You may feel some pressure while the radioactive tracer is injected into your vein. As you lie on the table, a special gamma camera is positioned over your abdomen taking pictures of the tracer as it moves through your body. The gamma camera takes pictures continually for about an hour. You'll need to keep still during the HIDA scan. This can become uncomfortable, but you may find that you can lessen the discomfort by taking deep breaths and thinking about other things. Tell your health care team if you're uncomfortable. The radiologist will watch on a computer the progress of the radioactive tracer through your body. The HIDA scan may be stopped when the radioactive tracer is seen in the gallbladder and enters your small intestine. This typically takes about an hour. In some cases extra imaging will be performed if original images aren't satisfactory, if morphine is given to help visualize the gallbladder or if the medication CCK is given to look at the contraction of the gallbladder. This test typically takes 2 hours to complete. ________________________________________________________________________ Due to recent changes in healthcare laws, you may see the results of your imaging and laboratory studies on MyChart before your provider has had a chance to review them.  We understand that in some cases there may be results that are confusing or concerning to you. Not all laboratory results come back in the same time frame and the provider may be waiting for multiple results in order to interpret others.  Please give [place] 48 hours in order for  your provider to thoroughly review all the results before contacting the office for clarification of your results.

## 2019-09-10 NOTE — Progress Notes (Signed)
09/10/2019 Allison Chandler [id number] 27-Nov-1990   HISTORY OF PRESENT ILLNESS: This is a 29 year old female who is new to our office.  Has been referred here by her PCP, Willeen Niece, PA-C, for evaluation regarding complaints of right upper quadrant abdominal pain and rectal bleeding.  PCP wanted [place] to consider colonoscopy.  The patient tells me that the rectal bleeding has been occurring on and off, but quite frequently for at least the past 6 months.  Says that it is probably actually been longer than that.  Describes bright red blood mixed with stool.  She does admit to constipation.  Her PCP actually recently prescribed Linzess 72 mcg daily, but she has not started taking that yet.  She also describes right upper quadrant abdominal pain that has actually been present intermittently over the past month or so.  Says it usually occurs sometime shortly after eating.  Is not having any pain currently.  Had a CT scan of the abdomen and pelvis with contrast on August 10, 2019 that was unremarkable with no cause of her symptoms found.  Did show hepatic steatosis.  CBC, CMP, lipase all unremarkable.  Has some nausea, but uses antiemetics.  Denies vomiting.    Past Medical History:  Diagnosis Date  . Anxiety   . Back pain, chronic   . Bipolar 1 disorder ([place])   . Depression   . HTN (hypertension)   . PCOD (polycystic ovarian disease)    Past Surgical History:  Procedure Laterality Date  . NO PAST SURGERIES      reports that she has been smoking cigarettes. She has never used smokeless tobacco. She reports current alcohol use. She reports that she does not use drugs. family history includes Diabetes in her mother; Heart disease in her mother; Hyperlipidemia in her mother; Hypertension in her mother; Ulcerative colitis in her mother. Allergies  Allergen Reactions  . Wellbutrin [Bupropion] Rash      Outpatient Encounter Medications as of 09/10/2019  Medication Sig  . acetaminophen  (TYLENOL) 500 MG tablet Take 1,000 mg by mouth. Every 6 to 8 hours as needed for pain  . albuterol (VENTOLIN HFA) 108 (90 Base) MCG/ACT inhaler Inhale 2 puffs into the lungs as needed.  Marland Kitchen amLODipine (NORVASC) 5 MG tablet Take 1 tablet by mouth daily.  Marland Kitchen atomoxetine (STRATTERA) 40 MG capsule Take 40 mg by mouth daily.  . benztropine (COGENTIN) 0.5 MG tablet Take 0.5 mg by mouth 2 (two) times daily.  . busPIRone (BUSPAR) 10 MG tablet Take 10 mg by mouth 3 (three) times daily.  . cetirizine (ZYRTEC) 10 MG tablet Take 10 mg by mouth daily.  Marland Kitchen FLUoxetine (PROZAC) 40 MG capsule Take 40 mg by mouth daily.  . fluticasone (FLONASE) 50 MCG/ACT nasal spray Place 1 spray into both nostrils daily.  . hydrOXYzine (ATARAX/VISTARIL) 25 MG tablet Take 25 mg by mouth 3 (three) times daily as needed for anxiety.  . hydrOXYzine (VISTARIL) 50 MG capsule Take 1-2 capsules by mouth at bedtime as needed.  Marland Kitchen LINZESS 72 MCG capsule Take 72 mcg by mouth daily.  . meloxicam (MOBIC) 15 MG tablet Take 1 tablet by mouth daily.  . montelukast (SINGULAIR) 10 MG tablet Take 1 tablet by mouth daily.  . nicotine (NICODERM CQ - DOSED IN MG/24 HOURS) 21 mg/24hr patch APPLY PATCH TO UPPER BODY OR ARM EVERY 24 HOURS FOR 4 WEEKS. FOLLOW WITH STEPPED THERAPY.  Marland Kitchen Olopatadine HCl 0.2 % SOLN Place 1 drop into both eyes daily.   Marland Kitchen  ondansetron (ZOFRAN-ODT) 4 MG disintegrating tablet Take 4 mg by mouth every 8 (eight) hours as needed.  . Oxycodone HCl 10 MG TABS Take 10 mg by mouth every 6 (six) hours.  . propranolol (INDERAL) 20 MG tablet Take 20 mg by mouth 2 (two) times daily.  Marland Kitchen tiZANidine (ZANAFLEX) 4 MG tablet Take 4 mg by mouth every 8 (eight) hours.  Marland Kitchen VRAYLAR 4.5 MG CAPS Take 1 capsule by mouth daily.  . [DISCONTINUED] busPIRone (BUSPAR) 5 MG tablet Take 5 mg by mouth 3 (three) times daily.  . [DISCONTINUED] cariprazine (VRAYLAR) capsule Take 3 mg by mouth daily.  . [DISCONTINUED] CHANTIX STARTING MONTH PAK 0.5 MG X 11 & 1 MG X 42  tablet Take 1 tablet by mouth as directed.  . [DISCONTINUED] gabapentin (NEURONTIN) 400 MG capsule Take 400 mg by mouth 3 (three) times daily.  . [DISCONTINUED] rOPINIRole (REQUIP) 0.25 MG tablet Take 0.25 mg by mouth daily as needed (restless legs).  . [DISCONTINUED] valACYclovir (VALTREX) 1000 MG tablet Take 1 tablet (1,000 mg total) by mouth 3 (three) times daily.   No facility-administered encounter medications on file as of 09/10/2019.     REVIEW OF SYSTEMS  : All other systems reviewed and negative except where noted in the History of Present Illness.   PHYSICAL EXAM: Temp 98.5 F (36.9 C)   Ht 5\' 3"  (1.6 m) Comment: height measured without shoes  Wt 236 lb 4 oz (107.2 kg)   BMI 41.85 kg/m  General: Well developed white female in no acute distress Head: Normocephalic and atraumatic Eyes:  Sclerae anicteric, conjunctiva pink. Ears: Normal auditory acuity Lungs: Clear throughout to auscultation; no increased WOB Heart: Regular rate and rhythm; no M/R/G. Abdomen: Soft, non-distended.  BS present.  Non-tender. Rectal:  Will be done at the time of colonoscopy. Musculoskeletal: Symmetrical with no gross deformities  Skin: No lesions on visible extremities Extremities: No edema  Neurological: Alert oriented x 4, grossly non-focal Psychological:  Alert and cooperative. Normal mood and affect  ASSESSMENT AND PLAN: *Right upper quadrant abdominal pain: Intermittent after eating for the past month.  CT scan unremarkable.  Question biliary dyskinesia.  We will plan for HIDA scan with [facility]. *Rectal bleeding: Describes bright red blood mixed with stool.  This is also in the setting of constipation, but has been occurring quite regularly for the past 6 months or more.  Recently prescribed Linzess 72 mcg daily by her PCP, but she has not started that yet.  We will plan for colonoscopy for evaluation with Dr. Ardis Hughs.  The risks, benefits, and alternatives to colonoscopy were discussed with the  patient and she consents to proceed.    CC:  Katherina Mires, MD CC:  Willeen Niece, PA-C

## 2019-09-12 NOTE — Progress Notes (Signed)
I agree with the above note, plan 

## 2019-09-14 ENCOUNTER — Telehealth: Payer: Self-pay | Admitting: Gastroenterology

## 2019-09-14 NOTE — Telephone Encounter (Signed)
Notified the patient she was given instructions if she needed to reschedule her HIDA Scan she was given the number to call. Patient verbalized understanding.

## 2019-09-14 NOTE — Telephone Encounter (Signed)
Pt requested to reschedule HIDA scan.

## 2019-09-20 ENCOUNTER — Ambulatory Visit (HOSPITAL_COMMUNITY): Payer: BC Managed Care – PPO

## 2019-10-01 ENCOUNTER — Encounter: Payer: BC Managed Care – PPO | Admitting: Gastroenterology

## 2019-10-04 ENCOUNTER — Emergency Department (HOSPITAL_COMMUNITY)
Admission: EM | Admit: 2019-10-04 | Discharge: 2019-10-04 | Disposition: A | Discharge status: Home / Self Care | Payer: BC Managed Care – PPO | Attending: Emergency Medicine | Admitting: Emergency Medicine

## 2019-10-04 ENCOUNTER — Other Ambulatory Visit: Payer: Self-pay

## 2019-10-04 ENCOUNTER — Encounter (HOSPITAL_COMMUNITY): Payer: Self-pay | Admitting: *Deleted

## 2019-10-04 DIAGNOSIS — Z5321 Procedure and treatment not carried out due to patient leaving prior to being seen by health care provider: Secondary | ICD-10-CM | POA: Insufficient documentation

## 2019-10-04 DIAGNOSIS — N898 Other specified noninflammatory disorders of vagina: Secondary | ICD-10-CM | POA: Insufficient documentation

## 2019-10-04 NOTE — ED Triage Notes (Signed)
Pt stating she has had clear vaginal discharge, burning and itching since removal of her IUD 2 weeks ago. Denies pain. No urinary symptoms.

## 2019-10-14 ENCOUNTER — Telehealth: Payer: Self-pay | Admitting: Gastroenterology

## 2019-10-14 NOTE — Telephone Encounter (Signed)
Pt states that her pharmacy did not receive suprep. Pls send it again to [facility] on [address] [facility].

## 2019-10-15 ENCOUNTER — Other Ambulatory Visit: Payer: Self-pay | Admitting: General Surgery

## 2019-10-15 MED ORDER — NA SULFATE-K SULFATE-MG SULF 17.5-3.13-1.6 GM/177ML PO SOLN: 1.0000 | Freq: Once | ORAL | 0 refills | Status: AC

## 2019-10-18 ENCOUNTER — Ambulatory Visit (HOSPITAL_COMMUNITY): Payer: BC Managed Care – PPO

## 2019-10-18 ENCOUNTER — Encounter: Payer: Self-pay | Admitting: Gastroenterology

## 2019-10-23 ENCOUNTER — Other Ambulatory Visit: Payer: Self-pay

## 2019-10-23 ENCOUNTER — Encounter (HOSPITAL_COMMUNITY): Payer: Self-pay

## 2019-10-23 ENCOUNTER — Emergency Department (HOSPITAL_COMMUNITY)
Admission: EM | Admit: 2019-10-23 | Discharge: 2019-10-24 | Disposition: A | Discharge status: Home / Self Care | Payer: BC Managed Care – PPO | Attending: Emergency Medicine | Admitting: Emergency Medicine

## 2019-10-23 DIAGNOSIS — Z79899 Other long term (current) drug therapy: Secondary | ICD-10-CM | POA: Insufficient documentation

## 2019-10-23 DIAGNOSIS — R519 Headache, unspecified: Secondary | ICD-10-CM | POA: Insufficient documentation

## 2019-10-23 DIAGNOSIS — I10 Essential (primary) hypertension: Secondary | ICD-10-CM | POA: Insufficient documentation

## 2019-10-23 DIAGNOSIS — R112 Nausea with vomiting, unspecified: Secondary | ICD-10-CM

## 2019-10-23 DIAGNOSIS — F17210 Nicotine dependence, cigarettes, uncomplicated: Secondary | ICD-10-CM | POA: Insufficient documentation

## 2019-10-23 LAB — COMPREHENSIVE METABOLIC PANEL
ALT: 22 U/L (ref 0–44)
AST: 19 U/L (ref 15–41)
Albumin: 4.3 g/dL (ref 3.5–5.0)
Alkaline Phosphatase: 47 U/L (ref 38–126)
Anion gap: 11 (ref 5–15)
BUN: 10 mg/dL (ref 6–20)
CO2: 25 mmol/L (ref 22–32)
Calcium: 9.3 mg/dL (ref 8.9–10.3)
Chloride: 100 mmol/L (ref 98–111)
Creatinine, Ser: 0.7 mg/dL (ref 0.44–1.00)
GFR calc Af Amer: >60 mL/min (ref >60)
GFR calc non Af Amer: >60 mL/min (ref >60)
Glucose, Bld: 143 mg/dL — ABNORMAL HIGH (ref 70–99)
Potassium: 3.4 mmol/L — ABNORMAL LOW (ref 3.5–5.1)
Sodium: 136 mmol/L (ref 135–145)
Total Bilirubin: 0.5 mg/dL (ref 0.3–1.2)
Total Protein: 8.4 g/dL — ABNORMAL HIGH (ref 6.5–8.1)

## 2019-10-23 LAB — CBC
HCT: 41.6 % (ref 36.0–46.0)
Hemoglobin: 13.5 g/dL (ref 12.0–15.0)
MCH: 28 pg (ref 26.0–34.0)
MCHC: 32.5 g/dL (ref 30.0–36.0)
MCV: 86.1 fL (ref 80.0–100.0)
Platelets: 321 10*3/uL (ref 150–400)
RBC: 4.83 MIL/uL (ref 3.87–5.11)
RDW: 12.5 % (ref 11.5–15.5)
WBC: 17.2 10*3/uL — ABNORMAL HIGH (ref 4.0–10.5)
nRBC: 0 % (ref 0.0–0.2)

## 2019-10-23 LAB — LIPASE, BLOOD: Lipase: 20 U/L (ref 11–51)

## 2019-10-23 LAB — URINALYSIS, ROUTINE W REFLEX MICROSCOPIC
Bilirubin Urine: NEGATIVE
Glucose, UA: NEGATIVE mg/dL
Hgb urine dipstick: NEGATIVE
Ketones, ur: 20 mg/dL — AB
Leukocytes,Ua: NEGATIVE
Nitrite: NEGATIVE
Protein, ur: 100 mg/dL — AB
Specific Gravity, Urine: 1.03 (ref 1.005–1.030)
pH: 5 (ref 5.0–8.0)

## 2019-10-23 LAB — I-STAT BETA HCG BLOOD, ED (MC, WL, AP ONLY): I-stat hCG, quantitative: <5 m[IU]/mL (ref <5)

## 2019-10-23 MED ORDER — SODIUM CHLORIDE 0.9% FLUSH: 3.0000 mL | Freq: Once | INTRAVENOUS | Status: DC

## 2019-10-23 MED ORDER — SODIUM CHLORIDE 0.9 % IV BOLUS
1000.0000 mL | Freq: Once | INTRAVENOUS | Status: AC
  Administered 2019-10-23: 1000 mL via INTRAVENOUS

## 2019-10-23 MED ORDER — ONDANSETRON HCL 4 MG/2ML IJ SOLN
4.0000 mg | Freq: Once | INTRAMUSCULAR | Status: AC
  Administered 2019-10-23: 4 mg via INTRAVENOUS
  Filled 2019-10-23: qty 2

## 2019-10-23 NOTE — ED Triage Notes (Signed)
Pt reports nausea and vomiting starting last night. States that she started getting a headache shortly after. Denies head injury. A&Ox4. Ambulatory with a steady gait. No focal neuro deficits noted.

## 2019-10-23 NOTE — ED Provider Notes (Signed)
[facility] DEPT Provider Note   CSN: [id number] Arrival date & time: 10/23/19  2030     History Chief Complaint  Patient presents with  . Emesis  . Headache    Allison Chandler is a 29 y.o. female.  Patient presents to the emergency department with a chief complaint of nausea and vomiting.  She reports that her symptoms started about 24 hours ago.  She denies any fevers or chills.  Denies any diarrhea.  Denies any sick contacts.  She denies eating anything unusual.  Denies any abdominal pain.  She reports mild headache.  She denies any dysuria, hematuria, vaginal discharge.  Denies any other associated symptoms.  Denies any treatments prior to arrival.  The history is provided by the patient. No language interpreter was used.       Past Medical History:  Diagnosis Date  . Anxiety   . Back pain, chronic   . Bipolar 1 disorder ([place])   . Depression   . HTN (hypertension)   . PCOD (polycystic ovarian disease)     Patient Active Problem List   Diagnosis Date Noted  . RUQ abdominal pain 09/10/2019  . Rectal bleeding 09/10/2019  . SIRS (systemic inflammatory response syndrome) ([place]) 04/09/2018  . Bipolar disorder ([place]) 04/09/2018  . ADD (attention deficit disorder) 04/09/2018  . Upper respiratory tract infection 08/13/2017    Past Surgical History:  Procedure Laterality Date  . NO PAST SURGERIES       OB History    Gravida  1   Para  1   Term  1   Preterm      AB      Living  1     SAB      TAB      Ectopic      Multiple      Live Births  1           Family History  Problem Relation Age of Onset  . Hypertension Mother   . Ulcerative colitis Mother   . Diabetes Mother   . Heart disease Mother   . Hyperlipidemia Mother     Social History   Tobacco Use  . Smoking status: Current Every Day Smoker    Types: Cigarettes  . Smokeless tobacco: Never Used  Substance Use Topics  . Alcohol use: Yes    Comment:  occasionally  . Drug use: No    Home Medications Prior to Admission medications   Medication Sig Start Date End Date Taking? Authorizing Provider  acetaminophen (TYLENOL) 500 MG tablet Take 1,000 mg by mouth. Every 6 to 8 hours as needed for pain    [provider]  albuterol (VENTOLIN HFA) 108 (90 Base) MCG/ACT inhaler Inhale 2 puffs into the lungs as needed. 09/02/18   [provider]  amLODipine (NORVASC) 5 MG tablet Take 1 tablet by mouth daily. 06/21/19   [provider]  atomoxetine (STRATTERA) 40 MG capsule Take 40 mg by mouth daily. 01/14/18   [provider]  benztropine (COGENTIN) 0.5 MG tablet Take 0.5 mg by mouth 2 (two) times daily. 03/29/19   [provider]  busPIRone (BUSPAR) 10 MG tablet Take 10 mg by mouth 3 (three) times daily. 03/29/19   [provider]  cetirizine (ZYRTEC) 10 MG tablet Take 10 mg by mouth daily. 07/28/19   [provider]  FLUoxetine (PROZAC) 40 MG capsule Take 40 mg by mouth daily. 06/14/19   [provider]  fluticasone (  FLONASE) 50 MCG/ACT nasal spray Place 1 spray into both nostrils daily. 05/25/19   [provider]  hydrOXYzine (ATARAX/VISTARIL) 25 MG tablet Take 25 mg by mouth 3 (three) times daily as needed for anxiety. 01/14/18   [provider]  hydrOXYzine (VISTARIL) 50 MG capsule Take 1-2 capsules by mouth at bedtime as needed. 03/11/19   [provider]  LINZESS 72 MCG capsule Take 72 mcg by mouth daily. 09/04/19   [provider]  meloxicam (MOBIC) 15 MG tablet Take 1 tablet by mouth daily. 04/02/19   [provider]  montelukast (SINGULAIR) 10 MG tablet Take 1 tablet by mouth daily. 05/25/19   [provider]  nicotine (NICODERM CQ - DOSED IN MG/24 HOURS) 21 mg/24hr patch APPLY PATCH TO UPPER BODY OR ARM EVERY 24 HOURS FOR 4 WEEKS. FOLLOW WITH STEPPED THERAPY. 09/01/19   [provider]  Olopatadine HCl 0.2 % SOLN Place 1  drop into both eyes daily.  07/20/19   [provider]  ondansetron (ZOFRAN-ODT) 4 MG disintegrating tablet Take 4 mg by mouth every 8 (eight) hours as needed. 03/19/19   [provider]  Oxycodone HCl 10 MG TABS Take 10 mg by mouth every 6 (six) hours. 09/02/19   [provider]  propranolol (INDERAL) 20 MG tablet Take 20 mg by mouth 2 (two) times daily. 07/05/19   [provider]  tiZANidine (ZANAFLEX) 4 MG tablet Take 4 mg by mouth every 8 (eight) hours. 08/26/19   [provider]  VRAYLAR 4.5 MG CAPS Take 1 capsule by mouth daily. 09/07/19   [provider]    Allergies    Wellbutrin [bupropion]  Review of Systems   Review of Systems  All other systems reviewed and are negative.   Physical Exam Updated Vital Signs BP (!) 146/95 (BP Location: Left Arm)   Pulse 90   Temp 98 F (36.7 C) (Oral)   Resp 15   SpO2 98%   Physical Exam Vitals and nursing note reviewed.  Constitutional:      General: She is not in acute distress.    Appearance: She is well-developed.  HENT:     Head: Normocephalic and atraumatic.  Eyes:     Conjunctiva/sclera: Conjunctivae normal.  Cardiovascular:     Rate and Rhythm: Normal rate and regular rhythm.     Heart sounds: No murmur.  Pulmonary:     Effort: Pulmonary effort is normal. No respiratory distress.     Breath sounds: Normal breath sounds.  Abdominal:     Palpations: Abdomen is soft.     Tenderness: There is no abdominal tenderness.     Comments: No focal abdominal tenderness, no RLQ tenderness or pain at McBurney's point, no RUQ tenderness or Murphy's sign, no left-sided abdominal tenderness, no fluid wave, or signs of peritonitis   Musculoskeletal:        General: Normal range of motion.     Cervical back: Neck supple.  Skin:    General: Skin is warm and dry.  Neurological:     Mental Status: She is alert and oriented to person, place, and time.  Psychiatric:        Mood and Affect:  Mood normal.        Behavior: Behavior normal.     ED Results / Procedures / Treatments   Labs (all labs ordered are listed, but only abnormal results are displayed) Labs Reviewed  COMPREHENSIVE METABOLIC PANEL - Abnormal; Notable for the following components:  Result Value   Potassium 3.4 (*)    Glucose, Bld 143 (*)    Total Protein 8.4 (*)    All other components within normal limits  CBC - Abnormal; Notable for the following components:   WBC 17.2 (*)    All other components within normal limits  URINALYSIS, ROUTINE W REFLEX MICROSCOPIC - Abnormal; Notable for the following components:   APPearance TURBID (*)    Ketones, ur 20 (*)    Protein, ur 100 (*)    Bacteria, UA FEW (*)    All other components within normal limits  LIPASE, BLOOD  I-STAT BETA HCG BLOOD, ED ([facility], [facility], [facility] ONLY)    EKG None  Radiology No results found.  Procedures Procedures (including critical care time)  Medications Ordered in ED Medications  sodium chloride flush (NS) 0.9 % injection 3 mL (has no administration in time range)  sodium chloride 0.9 % bolus 1,000 mL (has no administration in time range)  ondansetron (ZOFRAN) injection 4 mg (has no administration in time range)    ED Course  I have reviewed the triage vital signs and the nursing notes.  Pertinent labs & imaging results that were available during my care of the patient were reviewed by me and considered in my medical decision making (see chart for details).    MDM Rules/Calculators/A&P                      This patient complains of nausea and vomiting, this involves an extensive number of treatment options, and is a complaint that carries with it a high risk of complications and morbidity.  The differential diagnosis includes viral GI bug, pancreatitis, appendicitis, cholecystitis, pregnancy, TOA, ovarian torsion, UTI.  Pertinent Labs I ordered, reviewed, and interpreted labs, which included CBC is notable for  leukocytosis to 17.2, otherwise CBC unremarkable, mild hypokalemia of 3.4.  Lipase normal.  Preg negative. UA inconsistent with UTI.   Medications I ordered medication zofran and fluids for vomiting.  Sources Previous records obtained and reviewed visit for abdominal pain 2 months ago with negative CT.  Consultants none  Critical Interventions  none  Reassessments After the interventions stated above, I reevaluated the patient and found feeling much improved.  Patient has no abdominal pain, only vomiting.  No tenderness on exam.  Due to lack of pain, I doubt surgical or acute abdomen.  Afebrile.  She is advised to return if symptoms worsen.    Final Clinical Impression(s) / ED Diagnoses Final diagnoses:  Nausea and vomiting, intractability of vomiting not specified, unspecified vomiting type    Rx / DC Orders ED Discharge Orders    None       Montine Circle, PA-C 10/24/19 0030    Drenda Freeze, MD 10/24/19 1530

## 2019-10-24 MED ORDER — ONDANSETRON 4 MG PO TBDP: 4.0000 mg | ORAL_TABLET | Freq: Three times a day (TID) | ORAL | 0 refills | Status: DC | PRN

## 2019-10-24 NOTE — ED Notes (Signed)
Pt a/o and ambulatory at discharge. PT verbalized discharge instructions and denies questions.

## 2019-10-24 NOTE — ED Notes (Signed)
Pt tolerated ginger ale for PA challenge.

## 2019-10-27 ENCOUNTER — Encounter: Payer: BC Managed Care – PPO | Admitting: Gastroenterology

## 2019-11-18 ENCOUNTER — Emergency Department (HOSPITAL_COMMUNITY)
Admission: EM | Admit: 2019-11-18 | Discharge: 2019-11-18 | Disposition: A | Discharge status: Home / Self Care | Payer: BC Managed Care – PPO | Attending: Emergency Medicine | Admitting: Emergency Medicine

## 2019-11-18 ENCOUNTER — Other Ambulatory Visit: Payer: Self-pay

## 2019-11-18 ENCOUNTER — Emergency Department (HOSPITAL_COMMUNITY): Payer: BC Managed Care – PPO

## 2019-11-18 ENCOUNTER — Encounter (HOSPITAL_COMMUNITY): Payer: Self-pay

## 2019-11-18 DIAGNOSIS — F909 Attention-deficit hyperactivity disorder, unspecified type: Secondary | ICD-10-CM | POA: Diagnosis not present

## 2019-11-18 DIAGNOSIS — R109 Unspecified abdominal pain: Secondary | ICD-10-CM | POA: Diagnosis present

## 2019-11-18 DIAGNOSIS — Z79899 Other long term (current) drug therapy: Secondary | ICD-10-CM | POA: Diagnosis not present

## 2019-11-18 DIAGNOSIS — Z87891 Personal history of nicotine dependence: Secondary | ICD-10-CM | POA: Insufficient documentation

## 2019-11-18 DIAGNOSIS — N390 Urinary tract infection, site not specified: Secondary | ICD-10-CM | POA: Diagnosis not present

## 2019-11-18 DIAGNOSIS — I10 Essential (primary) hypertension: Secondary | ICD-10-CM | POA: Insufficient documentation

## 2019-11-18 LAB — COMPREHENSIVE METABOLIC PANEL
ALT: 30 U/L (ref 0–44)
AST: 24 U/L (ref 15–41)
Albumin: 4 g/dL (ref 3.5–5.0)
Alkaline Phosphatase: 45 U/L (ref 38–126)
Anion gap: 12 (ref 5–15)
BUN: 9 mg/dL (ref 6–20)
CO2: 24 mmol/L (ref 22–32)
Calcium: 9.1 mg/dL (ref 8.9–10.3)
Chloride: 105 mmol/L (ref 98–111)
Creatinine, Ser: 0.61 mg/dL (ref 0.44–1.00)
GFR calc Af Amer: >60 mL/min (ref >60)
GFR calc non Af Amer: >60 mL/min (ref >60)
Glucose, Bld: 119 mg/dL — ABNORMAL HIGH (ref 70–99)
Potassium: 4 mmol/L (ref 3.5–5.1)
Sodium: 141 mmol/L (ref 135–145)
Total Bilirubin: 0.4 mg/dL (ref 0.3–1.2)
Total Protein: 7.9 g/dL (ref 6.5–8.1)

## 2019-11-18 LAB — URINALYSIS, ROUTINE W REFLEX MICROSCOPIC
Bilirubin Urine: NEGATIVE
Glucose, UA: NEGATIVE mg/dL
Ketones, ur: NEGATIVE mg/dL
Nitrite: NEGATIVE
Protein, ur: NEGATIVE mg/dL
Specific Gravity, Urine: 1.02 (ref 1.005–1.030)
pH: 5 (ref 5.0–8.0)

## 2019-11-18 LAB — CBC
HCT: 40.7 % (ref 36.0–46.0)
Hemoglobin: 12.8 g/dL (ref 12.0–15.0)
MCH: 26.8 pg (ref 26.0–34.0)
MCHC: 31.4 g/dL (ref 30.0–36.0)
MCV: 85.1 fL (ref 80.0–100.0)
Platelets: 250 10*3/uL (ref 150–400)
RBC: 4.78 MIL/uL (ref 3.87–5.11)
RDW: 12.7 % (ref 11.5–15.5)
WBC: 8.6 10*3/uL (ref 4.0–10.5)
nRBC: 0 % (ref 0.0–0.2)

## 2019-11-18 LAB — I-STAT BETA HCG BLOOD, ED (MC, WL, AP ONLY): I-stat hCG, quantitative: <5 m[IU]/mL (ref <5)

## 2019-11-18 LAB — LIPASE, BLOOD: Lipase: 24 U/L (ref 11–51)

## 2019-11-18 MED ORDER — SODIUM CHLORIDE 0.9 % IV BOLUS
1000.0000 mL | Freq: Once | INTRAVENOUS | Status: AC
  Administered 2019-11-18: 1000 mL via INTRAVENOUS

## 2019-11-18 MED ORDER — MORPHINE SULFATE (PF) 4 MG/ML IV SOLN
4.0000 mg | Freq: Once | INTRAVENOUS | Status: AC
  Administered 2019-11-18: 4 mg via INTRAVENOUS
  Filled 2019-11-18: qty 1

## 2019-11-18 MED ORDER — CEPHALEXIN 500 MG PO CAPS: 500.0000 mg | ORAL_CAPSULE | Freq: Two times a day (BID) | ORAL | 0 refills | Status: AC

## 2019-11-18 MED ORDER — SODIUM CHLORIDE 0.9% FLUSH
3.0000 mL | Freq: Once | INTRAVENOUS | Status: AC
  Administered 2019-11-18: 3 mL via INTRAVENOUS

## 2019-11-18 MED ORDER — ONDANSETRON HCL 4 MG/2ML IJ SOLN
4.0000 mg | Freq: Once | INTRAMUSCULAR | Status: AC
  Administered 2019-11-18: 4 mg via INTRAVENOUS
  Filled 2019-11-18: qty 2

## 2019-11-18 MED ORDER — CEPHALEXIN 500 MG PO CAPS
500.0000 mg | ORAL_CAPSULE | Freq: Once | ORAL | Status: AC
  Administered 2019-11-18: 500 mg via ORAL
  Filled 2019-11-18: qty 1

## 2019-11-18 NOTE — ED Triage Notes (Signed)
Patient c/o mid upper abdominal pressure since yesterday. Patient denies any N/v/d.

## 2019-11-18 NOTE — ED Provider Notes (Signed)
[facility] DEPT Provider Note   CSN: [id number] Arrival date & time: 11/18/19  1052     History Chief Complaint  Patient presents with  . Abdominal Pain    Allison Chandler is a 29 y.o. female with past medical history significant for chronic back pain, prior 1 disorder, hypertension, polycystic ovarian disease.  HPI Patient presents to the emergency department today with chief complaint of progressively worsening abdominal pain x2 days.  She states the pain is located in her left upper quadrant.  She states it will radiate across her abdomen.  She describes the pain as sharp.  She rates the pain 8 out of 10 in severity.  She reports associated nausea without emesis.  She denies history of similar pain.  She did not take any medication for symptoms prior to arrival.  She admits to dysuria but thinks it is related to her recent HSV diagnosis.  She denies any sick contacts, fever, chills, cough, chest pain, back pain, gross hematuria, pelvic pain, vaginal discharge, diarrhea.  She is sexually active with one female partner.  She has Mirena IUD and typically has no menses cycle therefore does not remember LMP.  Denies abdominal surgical history.  Patient had PCP visit x3 days ago.  She had negative for chlamydia, gonorrhea and trichomonas.  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      Past Medical History:  Diagnosis Date  . Anxiety   . Back pain, chronic   . Bipolar 1 disorder ([place])   . Depression   . HTN (hypertension)   . PCOD (polycystic ovarian disease)     Patient Active Problem List   Diagnosis Date Noted  . RUQ abdominal pain 09/10/2019  . Rectal bleeding 09/10/2019  . SIRS (systemic inflammatory response syndrome) ([place]) 04/09/2018  . Bipolar disorder ([place]) 04/09/2018  . ADD (attention deficit disorder) 04/09/2018  . Upper respiratory tract infection 08/13/2017    Past Surgical History:   Procedure Laterality Date  . NO PAST SURGERIES       OB History    Gravida  1   Para  1   Term  1   Preterm      AB      Living  1     SAB      TAB      Ectopic      Multiple      Live Births  1           Family History  Problem Relation Age of Onset  . Hypertension Mother   . Ulcerative colitis Mother   . Diabetes Mother   . Heart disease Mother   . Hyperlipidemia Mother     Social History   Tobacco Use  . Smoking status: Former Smoker    Types: Cigarettes  . Smokeless tobacco: Never Used  Vaping Use  . Vaping Use: Never used  Substance Use Topics  . Alcohol use: Yes    Comment: occasionally  . Drug use: No    Home Medications Prior to Admission medications   Medication Sig Start Date End Date Taking? Authorizing Provider  acetaminophen (TYLENOL) 500 MG tablet Take 1,000 mg by mouth every 6 (six) hours as needed for moderate pain or headache. Every 6 to 8 hours as needed for pain    Yes [provider]  albuterol (VENTOLIN HFA) 108 (90 Base) MCG/ACT inhaler Inhale 2 puffs into the  lungs every 6 (six) hours as needed for wheezing or shortness of breath.  09/02/18  Yes [provider]  amLODipine (NORVASC) 5 MG tablet Take 1 tablet by mouth every evening.  06/21/19  Yes [provider]  cetirizine (ZYRTEC) 10 MG tablet Take 10 mg by mouth daily. 07/28/19  Yes [provider]  FLUoxetine (PROZAC) 40 MG capsule Take 40 mg by mouth daily. 06/14/19  Yes [provider]  hydrOXYzine (VISTARIL) 50 MG capsule Take 50 mg by mouth at bedtime.  03/11/19  Yes [provider]  levonorgestrel (MIRENA) 20 MCG/24HR IUD 1 each by Intrauterine route once.   Yes [provider]  LINZESS 72 MCG capsule Take 72 mcg by mouth daily. 09/04/19  Yes [provider]  meloxicam (MOBIC) 15 MG tablet Take 1 tablet by mouth daily. 04/02/19  Yes [provider]  montelukast (SINGULAIR) 10 MG tablet Take 1  tablet by mouth daily. 05/25/19  Yes [provider]  ondansetron (ZOFRAN ODT) 4 MG disintegrating tablet Take 1 tablet (4 mg total) by mouth every 8 (eight) hours as needed. Patient taking differently: Take 4 mg by mouth every 8 (eight) hours as needed for nausea or vomiting.  10/24/19  Yes Montine Circle, PA-C  Oxycodone HCl 10 MG TABS Take 10 mg by mouth every 6 (six) hours. 09/02/19  Yes [provider]  propranolol (INDERAL) 20 MG tablet Take 20 mg by mouth 2 (two) times daily. 07/05/19  Yes [provider]  tiZANidine (ZANAFLEX) 4 MG tablet Take 4 mg by mouth at bedtime as needed for muscle spasms.  08/26/19  Yes [provider]  valACYclovir (VALTREX) 1000 MG tablet Take 1,000 mg by mouth 2 (two) times daily. 11/15/19  Yes [provider]  VRAYLAR 4.5 MG CAPS Take 1 capsule by mouth every evening.  09/07/19  Yes [provider]  cephALEXin (KEFLEX) 500 MG capsule Take 1 capsule (500 mg total) by mouth 2 (two) times daily for 5 days. 11/18/19 11/23/19  Laruen Risser, Harley Hallmark, PA-C  etonogestrel-ethinyl estradiol (NUVARING) 0.12-0.015 MG/24HR vaginal ring Place 1 each vaginally every 30 (thirty) days. Patient not taking: Reported on 11/18/2019 09/29/19   [provider]    Allergies    Wellbutrin [bupropion]  Review of Systems   Review of Systems  All other systems are reviewed and are negative for acute change except as noted in the HPI.   Physical Exam Updated Vital Signs BP (!) 158/110 (BP Location: Left Arm)   Pulse 83   Temp 98.9 F (37.2 C) (Oral)   Resp 18   Ht 5\' 3"  (1.6 m)   Wt 105.7 kg   SpO2 98%   BMI 41.27 kg/m   Physical Exam Vitals and nursing note reviewed.  Constitutional:      General: She is not in acute distress.    Appearance: She is not ill-appearing.  HENT:     Head: Normocephalic and atraumatic.     Right Ear: Tympanic membrane and external ear normal.     Left Ear: Tympanic membrane and external  ear normal.     Nose: Nose normal.     Mouth/Throat:     Mouth: Mucous membranes are moist.     Pharynx: Oropharynx is clear.  Eyes:     General: No scleral icterus.       Right eye: No discharge.        Left eye: No discharge.     Extraocular Movements: Extraocular movements intact.  Conjunctiva/sclera: Conjunctivae normal.     Pupils: Pupils are equal, round, and reactive to light.  Neck:     Vascular: No JVD.  Cardiovascular:     Rate and Rhythm: Normal rate and regular rhythm.     Pulses: Normal pulses.          Radial pulses are 2+ on the right side and 2+ on the left side.     Heart sounds: Normal heart sounds.  Pulmonary:     Comments: Lungs clear to auscultation in all fields. Symmetric chest rise. No wheezing, rales, or rhonchi. Abdominal:     General: Bowel sounds are normal.     Tenderness: There is no right CVA tenderness or left CVA tenderness.     Comments: Abdomen is soft, non-distended.  Mild tenderness to palpation of left upper quadrant.  No rigidity, no guarding. No peritoneal signs.  Musculoskeletal:        General: Normal range of motion.     Cervical back: Normal range of motion.  Skin:    General: Skin is warm and dry.     Capillary Refill: Capillary refill takes less than 2 seconds.  Neurological:     Mental Status: She is oriented to person, place, and time.     GCS: GCS eye subscore is 4. GCS verbal subscore is 5. GCS motor subscore is 6.     Comments: Fluent speech, no facial droop.  Psychiatric:        Behavior: Behavior normal.     ED Results / Procedures / Treatments   Labs (all labs ordered are listed, but only abnormal results are displayed) Labs Reviewed  COMPREHENSIVE METABOLIC PANEL - Abnormal; Notable for the following components:      Result Value   Glucose, Bld 119 (*)    All other components within normal limits  URINALYSIS, ROUTINE W REFLEX MICROSCOPIC - Abnormal; Notable for the following components:   APPearance HAZY (*)      Hgb urine dipstick SMALL (*)    Leukocytes,Ua MODERATE (*)    Bacteria, UA RARE (*)    All other components within normal limits  URINE CULTURE  LIPASE, BLOOD  CBC  I-STAT BETA HCG BLOOD, ED ([facility], [facility], [facility] ONLY)    EKG None  Radiology CT Renal Stone Study  Result Date: 11/18/2019 CLINICAL DATA:  Initial evaluation for acute flank pain, kidney stones suspected. EXAM: CT ABDOMEN AND PELVIS WITHOUT CONTRAST TECHNIQUE: Multidetector CT imaging of the abdomen and pelvis was performed following the standard protocol without IV contrast. COMPARISON:  Prior CT from 08/10/2019. FINDINGS: Lower chest: Visualized lung bases are clear. Hepatobiliary: Diffuse hepatic steatosis noted. Liver otherwise unremarkable. Gallbladder within normal limits. No biliary dilatation. Pancreas: Pancreas within normal limits. Spleen: Spleen within normal limits. Adrenals/Urinary Tract: Adrenal glands are normal. Kidneys equal in size without nephrolithiasis or hydronephrosis. No radiopaque calculi seen along the course of either renal collecting system. No hydroureter. Partially distended bladder within normal limits. No layering stones seen within the bladder lumen. Stomach/Bowel: Stomach within normal limits. No evidence for bowel obstruction. Normal appendix. No acute inflammatory changes seen about the bowels. Vascular/Lymphatic: Intra-abdominal aorta of normal caliber. No pathologically enlarged intra-abdominopelvic lymph nodes. Reproductive: IUD in place within the uterus. Uterus and ovaries otherwise unremarkable. Other: No free air or fluid. Musculoskeletal: No acute osseous abnormality. No discrete or worrisome osseous lesions. IMPRESSION: 1. No CT evidence for nephrolithiasis or obstructive uropathy. 2. No other acute intra-abdominal or pelvic process identified. 3. Hepatic steatosis.  Electronically Signed   By: Jeannine Boga M.D.   On: 11/18/2019 19:48    Procedures Procedures (including critical care  time)  Medications Ordered in ED Medications  sodium chloride flush (NS) 0.9 % injection 3 mL (3 mLs Intravenous Given 11/18/19 1929)  morphine 4 MG/ML injection 4 mg (4 mg Intravenous Given 11/18/19 1929)  ondansetron (ZOFRAN) injection 4 mg (4 mg Intravenous Given 11/18/19 1928)  sodium chloride 0.9 % bolus 1,000 mL (0 mLs Intravenous Stopped 11/18/19 2047)  cephALEXin (KEFLEX) capsule 500 mg (500 mg Oral Given 11/18/19 2051)    ED Course  I have reviewed the triage vital signs and the nursing notes.  Pertinent labs & imaging results that were available during my care of the patient were reviewed by me and considered in my medical decision making (see chart for details).    MDM Rules/Calculators/A&P                          History provided by patient with additional history obtained from chart review.    Patient presents to the ED with complaints of abdominal pain. Patient nontoxic appearing, in no apparent distress. BP is elevated in triage, she has known htn diagnosis and is on antihypertensives. On exam patient tender to LUQ, no peritoneal signs. No CVA tenderness.  Labs were collected in triage.  I viewed results which show an unremarkable CBC, CMP, lipase.  Pregnancy test is negative.  UA has small hemoglobinuria, moderate leukocytes, 11-20 RBC, 21-50 WBC and rare bacteria.  Sample does not appear contaminated.  Patient is complaining of dysuria, urine culture sent.  Patient given dose of analgesics, antiemetics and IV fluids. CT renal shows no acute abnormalities.  No signs of a kidney stone.  She does have hepatic steatosis.  I discussed results with patient, she has been told about hepatic steatosis in the past. On repeat abdominal exam patient remains without peritoneal signs, doubt cholecystitis, pancreatitis, diverticulitis, appendicitis, bowel obstruction/perforation, ovarian torsion, ovarian cyst.  Patient had pelvic exam x3 days ago at PCP office.  She does not wish to have  pelvic exam performed today as she has no pelvic pain or vaginal discharge.  I viewed her STD test results which are negative except for HSV-2 she is currently being treated for with Valtrex. Patient tolerating PO in the emergency department. Will discharge home with prescription for Keflex for UTI.  I discussed results, treatment plan, need for PCP follow-up, and return precautions with the patient. Provided opportunity for questions, patient confirmed understanding and is in agreement with plan.    Portions of this note were generated with [employer]. Dictation errors may occur despite best attempts at proofreading.  Final Clinical Impression(s) / ED Diagnoses Final diagnoses:  Lower urinary tract infectious disease    Rx / DC Orders ED Discharge Orders         Ordered    cephALEXin (KEFLEX) 500 MG capsule  2 times daily     Discontinue  Reprint     11/18/19 2048           Nayleen Janosik, Harley Hallmark, PA-C 11/18/19 2121    Drenda Freeze, MD 11/18/19 2322

## 2019-11-18 NOTE — ED Notes (Signed)
Tolerating PO fluids °

## 2019-11-18 NOTE — Discharge Instructions (Addendum)
You have been seen today for abdominal pain. Please read and follow all provided instructions. Return to the emergency room for worsening condition or new concerning symptoms.    Your CT scan did not show any abnormalities.  Your urine sample did suggest urinary tract infection.  You received a dose of morphine in the emergency department for pain.  1. Medications:  Prescription to your pharmacy for Keflex.  This is an antibiotic used to treat urinary tract infections.  Please take as prescribed.  Continue usual home medications Take medications as prescribed. Please review all of the medicines and only take them if you do not have an allergy to them.   2. Treatment: rest, drink plenty of fluids  3. Follow Up:  Please follow up with primary care provider by scheduling an appointment as soon as possible for a visit     It is also a possibility that you have an allergic reaction to any of the medicines that you have been prescribed - Everybody reacts differently to medications and while MOST people have no trouble with most medicines, you may have a reaction such as nausea, vomiting, rash, swelling, shortness of breath. If this is the case, please stop taking the medicine immediately and contact your physician.  ?

## 2019-11-20 LAB — URINE CULTURE

## 2020-03-11 ENCOUNTER — Encounter (HOSPITAL_COMMUNITY): Payer: Self-pay | Admitting: *Deleted

## 2020-03-11 ENCOUNTER — Emergency Department (HOSPITAL_COMMUNITY)
Admission: EM | Admit: 2020-03-11 | Discharge: 2020-03-11 | Disposition: A | Discharge status: Home / Self Care | Payer: Medicaid Other | Attending: Emergency Medicine | Admitting: Emergency Medicine

## 2020-03-11 ENCOUNTER — Other Ambulatory Visit: Payer: Self-pay

## 2020-03-11 DIAGNOSIS — R509 Fever, unspecified: Secondary | ICD-10-CM | POA: Insufficient documentation

## 2020-03-11 DIAGNOSIS — Z5321 Procedure and treatment not carried out due to patient leaving prior to being seen by health care provider: Secondary | ICD-10-CM | POA: Diagnosis not present

## 2020-03-11 DIAGNOSIS — R519 Headache, unspecified: Secondary | ICD-10-CM | POA: Insufficient documentation

## 2020-03-11 DIAGNOSIS — R1110 Vomiting, unspecified: Secondary | ICD-10-CM | POA: Insufficient documentation

## 2020-03-11 LAB — I-STAT BETA HCG BLOOD, ED (MC, WL, AP ONLY): I-stat hCG, quantitative: <5 m[IU]/mL (ref <5)

## 2020-03-11 LAB — CBC
HCT: 44.7 % (ref 36.0–46.0)
Hemoglobin: 14 g/dL (ref 12.0–15.0)
MCH: 26.9 pg (ref 26.0–34.0)
MCHC: 31.3 g/dL (ref 30.0–36.0)
MCV: 86 fL (ref 80.0–100.0)
Platelets: 345 10*3/uL (ref 150–400)
RBC: 5.2 MIL/uL — ABNORMAL HIGH (ref 3.87–5.11)
RDW: 12.8 % (ref 11.5–15.5)
WBC: 14.2 10*3/uL — ABNORMAL HIGH (ref 4.0–10.5)
nRBC: 0 % (ref 0.0–0.2)

## 2020-03-11 LAB — COMPREHENSIVE METABOLIC PANEL
ALT: 26 U/L (ref 0–44)
AST: 25 U/L (ref 15–41)
Albumin: 4.1 g/dL (ref 3.5–5.0)
Alkaline Phosphatase: 54 U/L (ref 38–126)
Anion gap: 13 (ref 5–15)
BUN: 8 mg/dL (ref 6–20)
CO2: 23 mmol/L (ref 22–32)
Calcium: 9.3 mg/dL (ref 8.9–10.3)
Chloride: 101 mmol/L (ref 98–111)
Creatinine, Ser: 0.56 mg/dL (ref 0.44–1.00)
GFR, Estimated: >60 mL/min (ref >60)
Glucose, Bld: 134 mg/dL — ABNORMAL HIGH (ref 70–99)
Potassium: 3.8 mmol/L (ref 3.5–5.1)
Sodium: 137 mmol/L (ref 135–145)
Total Bilirubin: 0.6 mg/dL (ref 0.3–1.2)
Total Protein: 7.9 g/dL (ref 6.5–8.1)

## 2020-03-11 LAB — LIPASE, BLOOD: Lipase: 20 U/L (ref 11–51)

## 2020-03-11 NOTE — ED Triage Notes (Signed)
Pt states she developed headache, vomiting and fever about 10 pm last night. She has vomited twice today. Has taken Tylenol and Zofran for symptoms today.

## 2020-07-10 ENCOUNTER — Ambulatory Visit: Payer: Medicaid Other | Attending: Family Medicine

## 2020-07-10 ENCOUNTER — Other Ambulatory Visit: Payer: Self-pay

## 2020-07-10 DIAGNOSIS — R293 Abnormal posture: Secondary | ICD-10-CM | POA: Diagnosis present

## 2020-07-10 DIAGNOSIS — G8929 Other chronic pain: Secondary | ICD-10-CM | POA: Diagnosis present

## 2020-07-10 DIAGNOSIS — M545 Low back pain, unspecified: Secondary | ICD-10-CM | POA: Insufficient documentation

## 2020-07-10 DIAGNOSIS — M62838 Other muscle spasm: Secondary | ICD-10-CM | POA: Diagnosis present

## 2020-07-11 NOTE — Therapy (Signed)
[facility] Outpatient Rehabilitation [facility] [address] [place], [place], [address] Phone: [phone]   Fax:  [phone]  Physical Therapy Evaluation  Patient Details  Name: Allison Chandler MRN: [id number] Date of Birth: 1991-03-10 Referring Provider (PT): Ellender Hose, NP   Encounter Date: 07/10/2020   PT End of Session - 07/11/20 2215    Visit Number 1    Number of Visits 9    Date for PT Re-Evaluation 09/09/20    Authorization Type MCD [place] Access    PT Start Time 1130    PT Stop Time 1213    PT Time Calculation (min) 43 min    Activity Tolerance Patient tolerated treatment well    Behavior During Therapy [facility] for tasks assessed/performed           Past Medical History:  Diagnosis Date  . Anxiety   . Back pain, chronic   . Bipolar 1 disorder (HCC)   . Depression   . HTN (hypertension)   . PCOD (polycystic ovarian disease)     Past Surgical History:  Procedure Laterality Date  . NO PAST SURGERIES      There were no vitals filed for this visit.        Outpatient Rehab from 07/10/2020 in [facility]    07/10/2020    1133    Symptoms/Limitations    Subjective "My tailbone and lower back area hurt a lot - I think they said around my coccyx. I hurt the muscles in between my shoulder blades trying to lift a patient also. Those are the 2 hot spots. I have had back pain since high school and as long as I can remember. For my tailbone, I slipped and hit my tailbone on every step - at least 4-5 steps. For my shoulder/upper back, I was helping a patient stand and hurt it."   Patient is accompained by: --   Pertinent History See PMH above   Limitations Sitting; Lifting; House hold activities   How long can you sit comfortably? "Hurts immediately. Probably 20-30 minutes before changing positions."   How long can you stand comfortably? No limitations   How long can you walk comfortably? No limitations   Diagnostic  tests 10/14/2017 Lumbar radiograph: Very mild narrowing L5-S1 disc space. 05/18/2016: CT Lumbar spine: small disc protrusion L4-5; partially sacralized L5 vertebral body   Patient Stated Goals Cleaning the house, cleaning [place] pig area as well (placed on the floor)   Pain Assessment    Currently in Pain? Yes   Pain Score 3    Pain Location Back   Pain Orientation Lower; Mid; Upper; Left   Pain Descriptors / Indicators AchingPain Descriptors / Indicators. Aching. The comment is sometimes has tingling or sharp pain. Taken on 07/10/20 1133   Pain Type Chronic pain   Pain Radiating Towards Denies radicular symptoms at this time   Pain Onset More than a month ago   Pain Frequency Constant   Aggravating Factors  Sitting, bending over (forward)   Pain Relieving Factors Stretching provides a little bit of relief, heating pad, ice packs   Effect of Pain on Daily Activities Difficulty performing household activities such as dishes and caring for [place] pigs (2) and dog   Multiple Pain Sites --        [facility] PT Assessment - 07/11/20 0001      Assessment   Medical Diagnosis Lumbosacral pain    Referring Provider (PT) Ellender Hose, NP    Onset  Date/Surgical Date --   several years   Hand Dominance Left    Next MD Visit 07/16/2020    Prior Therapy For lumbosacral pain in 2020 then few visits in 2021 for shoulder/upper back      Precautions   Precautions None      Restrictions   Weight Bearing Restrictions No      Home Environment   Living Environment Private residence    Living Arrangements Children   currently pt and her daughter - will have her sister and fiance at home soon as well   Type of [place] to enter    Entrance Stairs-Number of Steps 8   2 steps to get in one door then 6 steps to get to another door   Entrance Stairs-Rails Left   L side ascending   Home Layout Two level    Alternate Level Stairs-Number of Steps 14    Alternate Level  Stairs-Rails Left   L ascending   Home Equipment None      Prior Function   Level of Independence Independent    Vocation Part time employment    Biomedical scientist Works for [employer] and [place] busy caring for her 6 year old daughter; enjoys any free time she is able to get to herself otherwise      Cognition   Overall Cognitive Status Within Functional Limits for tasks assessed      Observation/Other Assessments   Focus on Therapeutic Outcomes (FOTO)  No FOTO - MCD      AROM   Lumbar Flexion WFL - pt explains increased pain when bending down and reaching forward rather than reaching down towards shoes    Lumbar Extension WFL    Lumbar - Right Side Bend fingertips to superior pole of patella    Lumbar - Left Side Bend fingertips to superior pole of patella    Lumbar - Right Rotation WFL    Lumbar - Left Rotation WFL      Strength   Overall Strength Comments BLE MMT grossly 4+/5 with R hip FL 4/5 and shakiness with resistance secondary to weakness/fatigue      Special Tests    Special Tests Hip Special Tests;Lumbar    Other special tests (+) thigh thrust, ASIS compression and distraction. Long sit test revealed anterior rotation of L innominate that was no longer apparent following MET (L hip EXT and R hip FL)    Lumbar Tests FABER test      FABER test   findings Negative    Comment BLE                      Objective measurements completed on examination: See above findings.       [place] Adult PT Treatment/Exercise - 07/11/20 0001      Posture/Postural Control   Posture Comments Slouched sitting posture with somewhat forward head and rounded shoulders      Self-Care   Self-Care Other Self-Care Comments    Other Self-Care Comments  See pt education      Exercises   Exercises Lumbar      Lumbar Exercises: Stretches   Single Knee to Chest Stretch Right;Left;1 rep;30 seconds    Lower Trunk Rotation 3 reps    Piriformis Stretch  Right;Left;1 rep;30 seconds      Lumbar Exercises: Standing   Other Standing Lumbar Exercises Hip hinge x 5  Lumbar Exercises: Supine   Pelvic Tilt 5 reps    Clam 10 reps    Clam Limitations red theraband                  PT Education - 07/10/20 1811    Education Details HEP, objective findings and anatomy of condition, used skeleton model to explain/demonstrate innominate rotation and MET, POC    Person(s) Educated Patient    Methods Explanation;Demonstration;Tactile cues;Verbal cues;Handout    Comprehension Verbalized understanding;Returned demonstration;Verbal cues required;Tactile cues required            PT Short Term Goals - 07/11/20 2249      PT SHORT TERM GOAL #1   Title Patient will be independent with initial HEP    Baseline Pt provided initial HEP 07/10/2020.    Time 4    Period Weeks    Status New    Target Date 08/08/20      PT SHORT TERM GOAL #2   Title Patient will be able to tolerate sitting for at least 30 minutes without having to change positions and with </= 3/10 pain.    Baseline 3/10 pain at rest during evaluation but pt reports increase in pain with prolonged sitting and having to reposition after 20-30 minutes    Time 4    Period Weeks    Status New    Target Date 08/08/20      PT SHORT TERM GOAL #3   Title Patient will demonstrate optimal body mechanics when bending down and when squatting with </= 2/10 pain.    Baseline Patient reports her pain typically increases when bending down and reaching forward - cues provided for hip hinge and core activation at evaluation. Increased lumbar FL when bending over currently    Time 4    Period Weeks    Status New    Target Date 08/08/20      PT SHORT TERM GOAL #4   Title Pt will have no apparent innominate rotation upon reassessment of long sit test.    Baseline anterior rotation of L innominate noted during evaluation    Time 4    Period Weeks    Status New    Target Date 08/08/20              PT Long Term Goals - 07/11/20 2300      PT LONG TERM GOAL #1   Title Patient will be independent with advanced HEP    Baseline Pt provided initial HEP 07/10/2020.    Time 8    Period Weeks    Status New    Target Date 09/05/20      PT LONG TERM GOAL #2   Title Patient will be able to tolerate sitting for at least 1 hour without having to change positions and with </= 2/10 pain.    Baseline 3/10 pain at rest during evaluation but pt reports increase in pain with prolonged sitting and having to reposition after 20-30 minutes    Time 8    Period Weeks    Status New    Target Date 09/05/20      PT LONG TERM GOAL #3   Title Patient will increase BLE MMT to 5/5.    Baseline 4+/5 grossly with R hip FL 4/5    Time 8    Period Weeks    Status New    Target Date 09/05/20      PT LONG TERM GOAL #4   Title  Patient will report being able to clean her house and clean [place] pig homes with </= 2/10 LBP.    Baseline Currently has been unable to clean as she would like secondary to LBP    Time 8    Period Weeks    Status New    Target Date 09/05/20                07/10/2020   1145   Plan   Clinical Impression Statement Patient is a 30 year old female who presents to OPPT with complaints of lumbosacral pain that is worse on L side. Evaluation was somewhat limited secondary to patient receiving phone call about her 101 year old daughter falling and hitting her head at school, which resulted in patient needing to speak with the teacher, her daughter, and her daughter's father to make sure he could pick her up so she could complete PT session. Patient experienced TTP along L PSIS, glute max, piriformis, and sacrum. She demonstrates 4+/5 BLE strength grossly but has increased difficulty holding against resistance with RLE (especially with R hip FL being 4/5). She presents with (+) thigh thrust, SI compression and distraction tests, and long sit test. Long sit test revealed anterior  rotation of L innominate that was no longer present following MET. She should benefit from skilled PT intervention to address deficits and allow for improved tolerance with functional activities, such as cleaning [place] pig homes, caring for her 24 year old daughter, and sitting in car for prolonged time periods for work.  Personal Factors and Comorbidities Comorbidity 3+; Past/Current Experience; Time since onset of injury/illness/exacerbation; Profession  Comorbidities SIRS, HTN, bipolar disorder, anxiety, PCOS  Examination-Activity Limitations Bend; Caring for Others; Carry; Lift; Locomotion Level; Sit; Sleep; Squat; Stairs; Reach Overhead  Examination-Participation Restrictions Cleaning; Community Activity; Driving; Meal Prep; Occupation; Shop  Pt will benefit from skilled therapeutic intervention in order to improve on the following deficits Decreased activity tolerance; Decreased balance; Decreased mobility; Decreased strength; Difficulty walking; Decreased endurance; Improper body mechanics; Postural dysfunction; Hypomobility; Pain; Obesity  Stability/Clinical Decision Making Stable/Uncomplicated  Clinical Decision Making Low  Rehab Potential Good  PT Frequency 1x / week  PT Duration 8 weeks  PT Treatment/Interventions ADLs/Self Care Home Management; Aquatic Therapy; Cryotherapy; Electrical Stimulation; Iontophoresis 4mg /ml Dexamethasone; Moist Heat; Traction; Neuromuscular re-education; Balance training; Therapeutic exercise; Therapeutic activities; Functional mobility training; Stair training; Gait training; Patient/family education; Manual techniques; Energy conservation; Passive range of motion; Dry needling; Taping  PT Next Visit Plan Assess response to HEP. Update medications. Balance and lumbar spine assessment. Long sit test if indicated. Core and LE strengthening. Body mechanics squatting and bending to clean after 30 year old and [place] pigs. Review sitting posture while in car for work.  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  PT Home Exercise Plan [id number] - clamshell (red theraband), hip hinge, piriformis stretch, SKTC, PPT  Recommended Other Services --  Consulted and Agree with Plan of Care Patient          Patient will benefit from skilled therapeutic intervention in order to improve the following deficits and impairments:  Decreased activity tolerance,Decreased balance,Decreased mobility,Decreased strength,Difficulty walking,Decreased endurance,Improper body mechanics,Postural dysfunction,Hypomobility,Pain,Obesity  Visit Diagnosis: Chronic bilateral low back pain without sciatica  Abnormal posture  Other muscle spasm     Problem List Patient Active Problem List   Diagnosis Date Noted  . RUQ abdominal pain 09/10/2019  . Rectal bleeding 09/10/2019  . SIRS (systemic inflammatory response syndrome) ([place]) 04/09/2018  . Bipolar  disorder ([place]) 04/09/2018  . ADD (attention deficit disorder) 04/09/2018  . Upper respiratory tract infection 08/13/2017      Haydee Monica, PT, DPT 07/11/20 11:09 PM   [facility] [address] [place], [place], [address] Phone: [phone]   Fax:  [phone]  Name: Allison Chandler MRN: [id number] Date of Birth: 23-Oct-1990

## 2020-07-31 ENCOUNTER — Telehealth: Payer: Self-pay | Admitting: Physical Therapy

## 2020-07-31 ENCOUNTER — Ambulatory Visit: Payer: Medicaid Other | Admitting: Physical Therapy

## 2020-07-31 NOTE — Telephone Encounter (Signed)
Patient contacted due to missed PT appointment. Patient reports she forgot about the appointment, and she was scheduled for an appointment tomorrow, 08/01/2020, with Haydee Monica. Patient reminded of attendance policy and her next scheduled appointments. Patient expressed understanding.  Hilda Blades, PT, DPT, LAT, ATC 07/31/20  4:36 PM Phone: [phone] Fax: [phone]

## 2020-08-01 ENCOUNTER — Ambulatory Visit: Payer: Medicaid Other | Attending: Family Medicine

## 2020-08-01 ENCOUNTER — Other Ambulatory Visit: Payer: Self-pay

## 2020-08-01 DIAGNOSIS — R293 Abnormal posture: Secondary | ICD-10-CM | POA: Insufficient documentation

## 2020-08-01 DIAGNOSIS — M545 Low back pain, unspecified: Secondary | ICD-10-CM | POA: Insufficient documentation

## 2020-08-01 DIAGNOSIS — G8929 Other chronic pain: Secondary | ICD-10-CM | POA: Diagnosis present

## 2020-08-01 DIAGNOSIS — M62838 Other muscle spasm: Secondary | ICD-10-CM | POA: Diagnosis present

## 2020-08-01 NOTE — Therapy (Addendum)
[place], [place], [address] Phone: [phone]   Fax:  [phone]  Physical Therapy Treatment / Discharge  Patient Details  Name: Allison Chandler MRN: [id number] Date of Birth: Sep 22, 1990 Referring Provider (PT): Carylon Perches, NP   Encounter Date: 08/01/2020   PT End of Session - 08/01/20 0958    Visit Number 2    Number of Visits 9    Date for PT Re-Evaluation 09/09/20    Authorization Type MCD [place] Access    Authorization Time Period 07/31/2020 - 08/20/2020    Authorization - Visit Number 1    Authorization - Number of Visits 3    PT Start Time 1000    PT Stop Time 1041    PT Time Calculation (min) 41 min    Activity Tolerance Patient tolerated treatment well    Behavior During Therapy [facility] for tasks assessed/performed           Past Medical History:  Diagnosis Date  . Anxiety   . Back pain, chronic   . Bipolar 1 disorder ([place])   . Depression   . HTN (hypertension)   . PCOD (polycystic ovarian disease)     Past Surgical History:  Procedure Laterality Date  . NO PAST SURGERIES      There were no vitals filed for this visit.   Subjective Assessment - 08/01/20 0958    Subjective "Today I just woke up sore everywhere. I know I couldn't get comfortable because I was tossing and turning a lot." Pt expresses increased soreness in R thoracic spine, across low back, and in R knee today. She states she felt "good" after last session with some soreness. She reports having a follow up appointment with doctor 08/12/2020 and plans to potentially obtain referral for R knee pain.    Pertinent History See PMH above    Limitations Sitting;Lifting;House hold activities    How long can you sit comfortably? "Hurts immediately. Probably 20-30 minutes before changing positions."    How long can you stand comfortably? No limitations    How long can you walk comfortably? No limitations    Diagnostic tests  10/14/2017 Lumbar radiograph: Very mild narrowing L5-S1 disc space. 05/18/2016: CT Lumbar spine: small disc protrusion L4-5; partially sacralized L5 vertebral body    Patient Stated Goals Cleaning the house, cleaning [place] pig area as well (placed on the floor)    Currently in Pain? Yes    Pain Score 7     Pain Location Back    Pain Orientation Lower;Mid;Upper;Left    Pain Descriptors / Indicators Aching;Sore    Pain Type Chronic pain    Pain Onset More than a month ago              [facility] PT Assessment - 08/01/20 0001      Assessment   Medical Diagnosis Lumbosacral pain    Referring Provider (PT) Carylon Perches, NP                         [facility] Adult PT Treatment/Exercise - 08/01/20 0001      Lumbar Exercises: Stretches   Lower Trunk Rotation 60 seconds   2 x 60 sec   Hip Flexor Stretch Limitations L Thomas stretch x 90 sec      Lumbar Exercises: Aerobic   Nustep L3 x 4.5 min LE only      Lumbar Exercises: Supine   Clam 20 reps  Clam Limitations green theraband    Bent Knee Raise 15 reps   15x each LE   Bent Knee Raise Limitations maintaining low back against mat with cues for core activation    Bridge with [employer] 20 reps    Other Supine Lumbar Exercises Self MET (L hip EXT, R hip FL) with dowel (hips and knees at 90/90) 8 x 5 sec holds      Lumbar Exercises: Sidelying   Hip Abduction Both;20 reps      Manual Therapy   Manual therapy comments Long sit test reveals anteriorly placed/rotated L innominate - no longer apparent after self-MET                  PT Education - 08/01/20 1246    Education Details Reviewed HEP and self MET. Provided green theraband to increase resistance for clamshells at home.    Person(s) Educated Patient    Methods Explanation;Demonstration;Tactile cues;Verbal cues    Comprehension Verbalized understanding;Returned demonstration            PT Short Term Goals - 07/11/20 2249      PT SHORT TERM GOAL #1    Title Patient will be independent with initial HEP    Baseline Pt provided initial HEP 07/10/2020.    Time 4    Period Weeks    Status New    Target Date 08/08/20      PT SHORT TERM GOAL #2   Title Patient will be able to tolerate sitting for at least 30 minutes without having to change positions and with </= 3/10 pain.    Baseline 3/10 pain at rest during evaluation but pt reports increase in pain with prolonged sitting and having to reposition after 20-30 minutes    Time 4    Period Weeks    Status New    Target Date 08/08/20      PT SHORT TERM GOAL #3   Title Patient will demonstrate optimal body mechanics when bending down and when squatting with </= 2/10 pain.    Baseline Patient reports her pain typically increases when bending down and reaching forward - cues provided for hip hinge and core activation at evaluation. Increased lumbar FL when bending over currently    Time 4    Period Weeks    Status New    Target Date 08/08/20      PT SHORT TERM GOAL #4   Title Pt will have no apparent innominate rotation upon reassessment of long sit test.    Baseline anterior rotation of L innominate noted during evaluation    Time 4    Period Weeks    Status New    Target Date 08/08/20             PT Long Term Goals - 07/11/20 2300      PT LONG TERM GOAL #1   Title Patient will be independent with advanced HEP    Baseline Pt provided initial HEP 07/10/2020.    Time 8    Period Weeks    Status New    Target Date 09/05/20      PT LONG TERM GOAL #2   Title Patient will be able to tolerate sitting for at least 1 hour without having to change positions and with </= 2/10 pain.    Baseline 3/10 pain at rest during evaluation but pt reports increase in pain with prolonged sitting and having to reposition after 20-30 minutes    Time 8  Period Weeks    Status New    Target Date 09/05/20      PT LONG TERM GOAL #3   Title Patient will increase BLE MMT to 5/5.    Baseline 4+/5  grossly with R hip FL 4/5    Time 8    Period Weeks    Status New    Target Date 09/05/20      PT LONG TERM GOAL #4   Title Patient will report being able to clean her house and clean [place] pig homes with </= 2/10 LBP.    Baseline Currently has been unable to clean as she would like secondary to LBP    Time 8    Period Weeks    Status New    Target Date 09/05/20                 Plan - 08/01/20 0958    Clinical Impression Statement Patient tolerated session well with no adverse effects or complaints of increased pain. Long sit test reveals anterior rotation/positioning of L innominate that was no longer apparent after having patient perform self MET with dowel. MET was followed up with hip and core strengthening and stability exercises on mat this session with patient expressing that her hips/low back felt "not bad" towards end of session. She should progress well with functional and standing interventions beginning next session. Patient expresses activity being limited secondary to her R knee pain but has a doctor's appointment 08/12/2020 during which she plans to request referral for R knee as well. She should continue to benefit from skilled PT intervention for improved tolerance with daily activities, such as doing dishes, playing with/caring for her daughter, and cleaning after her [place] pigs.    Personal Factors and Comorbidities Comorbidity 3+;Past/Current Experience;Time since onset of injury/illness/exacerbation;Profession    Comorbidities SIRS, HTN, bipolar disorder, anxiety, PCOS    Examination-Activity Limitations Bend;Caring for Others;Carry;Lift;Locomotion Level;Sit;Sleep;Squat;Stairs;Reach Overhead    Examination-Participation Restrictions Cleaning;Community Activity;Driving;Meal Prep;Occupation;Shop    Stability/Clinical Decision Making Stable/Uncomplicated    Rehab Potential Good    PT Frequency 1x / week    PT Duration 8 weeks    PT Treatment/Interventions ADLs/Self  Care Home Management;Aquatic Therapy;Cryotherapy;Electrical Stimulation;Iontophoresis 4mg /ml Dexamethasone;Moist Heat;Traction;Neuromuscular re-education;Balance training;Therapeutic exercise;Therapeutic activities;Functional mobility training;Stair training;Gait training;Patient/family education;Manual techniques;Energy conservation;Passive range of motion;Dry needling;Taping    PT Next Visit Plan Update HEP pending response to previous session. Balance and lumbar spine assessment. Long sit test if indicated. Progress core and LE strengthening as tolerated. Body mechanics squatting and bending to clean after 30 year old and [place] pigs.    PT Home Exercise Plan [id number] - clamshell (green theraband), hip hinge, piriformis stretch, SKTC, PPT    Consulted and Agree with Plan of Care Patient           Patient will benefit from skilled therapeutic intervention in order to improve the following deficits and impairments:  Decreased activity tolerance,Decreased balance,Decreased mobility,Decreased strength,Difficulty walking,Decreased endurance,Improper body mechanics,Postural dysfunction,Hypomobility,Pain,Obesity  Visit Diagnosis: Chronic bilateral low back pain without sciatica  Abnormal posture  Other muscle spasm     Problem List Patient Active Problem List   Diagnosis Date Noted  . RUQ abdominal pain 09/10/2019  . Rectal bleeding 09/10/2019  . SIRS (systemic inflammatory response syndrome) ([place]) 04/09/2018  . Bipolar disorder ([place]) 04/09/2018  . ADD (attention deficit disorder) 04/09/2018  . Upper respiratory tract infection 08/13/2017      Haydee Monica, PT, DPT 08/01/20 12:53 PM  [facility] [address]  [place], [place], [address] Phone: [phone]   Fax:  [phone]  Name: Juley Giovanetti MRN: [id number] Date of Birth: 1991-05-03      PHYSICAL THERAPY DISCHARGE SUMMARY  Visits from Start of Care: 2  Current  functional level related to goals / functional outcomes: See goals   Remaining deficits: Current status unknown   Education / Equipment: HEP  Plan: Patient agrees to discharge.  Patient goals were not met. Patient is being discharged due to not returning since the last visit.  ?????         Kristoffer Leamon PT, DPT, LAT, ATC  09/19/20  12:25 PM

## 2020-08-07 ENCOUNTER — Ambulatory Visit: Payer: Medicaid Other | Admitting: Physical Therapy

## 2020-08-08 ENCOUNTER — Ambulatory Visit: Payer: Medicaid Other

## 2020-08-14 ENCOUNTER — Ambulatory Visit: Payer: Medicaid Other

## 2020-08-14 ENCOUNTER — Telehealth: Payer: Self-pay

## 2020-08-14 NOTE — Telephone Encounter (Signed)
Patient left voicemail regarding patient's second no show and reiterated attendance policy. Confirmed next appointment but informed patient that other appointments will be cancelled at this time per attendance policy, and she will be able to schedule 1 appt a time. Informed pt that she will be D/C if she does not show for next appt per attendance policy.  Haydee Monica, PT, DPT 08/14/20 12:01 PM

## 2020-08-21 ENCOUNTER — Ambulatory Visit: Payer: Medicaid Other

## 2020-08-28 ENCOUNTER — Other Ambulatory Visit: Payer: Self-pay

## 2020-08-28 ENCOUNTER — Encounter (HOSPITAL_COMMUNITY): Payer: Self-pay

## 2020-08-28 DIAGNOSIS — R079 Chest pain, unspecified: Secondary | ICD-10-CM | POA: Insufficient documentation

## 2020-08-28 DIAGNOSIS — Z5321 Procedure and treatment not carried out due to patient leaving prior to being seen by health care provider: Secondary | ICD-10-CM | POA: Insufficient documentation

## 2020-08-28 NOTE — ED Triage Notes (Signed)
Pt reports "not feeling so well". Reports feeling fatigued and generalized chest pains that began approx 1 hour pta.

## 2020-08-29 ENCOUNTER — Emergency Department (HOSPITAL_COMMUNITY)
Admission: EM | Admit: 2020-08-29 | Discharge: 2020-08-29 | Disposition: A | Discharge status: Home / Self Care | Payer: Medicaid Other | Attending: Emergency Medicine | Admitting: Emergency Medicine

## 2020-08-29 ENCOUNTER — Emergency Department (HOSPITAL_COMMUNITY): Payer: Medicaid Other

## 2020-08-29 LAB — I-STAT BETA HCG BLOOD, ED (NOT ORDERABLE): I-stat hCG, quantitative: <5 m[IU]/mL (ref <5)

## 2020-08-29 LAB — CBC
HCT: 43.2 % (ref 36.0–46.0)
Hemoglobin: 13.7 g/dL (ref 12.0–15.0)
MCH: 27.2 pg (ref 26.0–34.0)
MCHC: 31.7 g/dL (ref 30.0–36.0)
MCV: 85.9 fL (ref 80.0–100.0)
Platelets: 383 10*3/uL (ref 150–400)
RBC: 5.03 MIL/uL (ref 3.87–5.11)
RDW: 13.2 % (ref 11.5–15.5)
WBC: 12.6 10*3/uL — ABNORMAL HIGH (ref 4.0–10.5)
nRBC: 0 % (ref 0.0–0.2)

## 2020-08-29 LAB — BASIC METABOLIC PANEL
Anion gap: 10 (ref 5–15)
BUN: 11 mg/dL (ref 6–20)
CO2: 23 mmol/L (ref 22–32)
Calcium: 9.1 mg/dL (ref 8.9–10.3)
Chloride: 105 mmol/L (ref 98–111)
Creatinine, Ser: 0.61 mg/dL (ref 0.44–1.00)
GFR, Estimated: >60 mL/min (ref >60)
Glucose, Bld: 131 mg/dL — ABNORMAL HIGH (ref 70–99)
Potassium: 3.5 mmol/L (ref 3.5–5.1)
Sodium: 138 mmol/L (ref 135–145)

## 2020-08-29 LAB — TROPONIN I (HIGH SENSITIVITY): Troponin I (High Sensitivity): 2 ng/L (ref <18)

## 2020-08-29 NOTE — ED Notes (Signed)
Pt called for rooming x2. No answer 

## 2020-08-29 NOTE — ED Notes (Signed)
Pt called for rooming x1. No answer!

## 2020-08-29 NOTE — ED Notes (Signed)
Pt called 3x for room placement. Eloped from waiting area.  

## 2020-08-31 ENCOUNTER — Telehealth: Payer: Self-pay

## 2020-08-31 DIAGNOSIS — I10 Essential (primary) hypertension: Secondary | ICD-10-CM | POA: Insufficient documentation

## 2020-08-31 DIAGNOSIS — Z9109 Other allergy status, other than to drugs and biological substances: Secondary | ICD-10-CM | POA: Insufficient documentation

## 2020-08-31 NOTE — Telephone Encounter (Signed)
PT called and spoke with patient regarding plans to schedule PT sessions. Patient reports her recent cancellation was due to being sick then she wanted to wait until she saw her doctor to make sure she did not have any contagious illness. She confirms she is not contagious and plans to reschedule and does want to continue PT once she feels better.   Haydee Monica, PT, DPT 08/31/20 7:37 PM

## 2020-09-04 ENCOUNTER — Encounter: Payer: Medicaid Other | Admitting: Physical Therapy

## 2020-09-11 ENCOUNTER — Encounter (HOSPITAL_COMMUNITY): Payer: Self-pay

## 2020-09-11 ENCOUNTER — Emergency Department (HOSPITAL_COMMUNITY)
Admission: EM | Admit: 2020-09-11 | Discharge: 2020-09-11 | Disposition: A | Discharge status: Home / Self Care | Payer: Medicaid Other | Attending: Emergency Medicine | Admitting: Emergency Medicine

## 2020-09-11 ENCOUNTER — Other Ambulatory Visit: Payer: Self-pay

## 2020-09-11 DIAGNOSIS — I10 Essential (primary) hypertension: Secondary | ICD-10-CM | POA: Diagnosis not present

## 2020-09-11 DIAGNOSIS — T7421XA Adult sexual abuse, confirmed, initial encounter: Secondary | ICD-10-CM | POA: Diagnosis not present

## 2020-09-11 DIAGNOSIS — X58XXXA Exposure to other specified factors, initial encounter: Secondary | ICD-10-CM | POA: Diagnosis not present

## 2020-09-11 DIAGNOSIS — F17210 Nicotine dependence, cigarettes, uncomplicated: Secondary | ICD-10-CM | POA: Insufficient documentation

## 2020-09-11 DIAGNOSIS — Z79899 Other long term (current) drug therapy: Secondary | ICD-10-CM | POA: Insufficient documentation

## 2020-09-11 DIAGNOSIS — S3663XA Laceration of rectum, initial encounter: Secondary | ICD-10-CM | POA: Diagnosis not present

## 2020-09-11 DIAGNOSIS — S3994XA Unspecified injury of external genitals, initial encounter: Secondary | ICD-10-CM | POA: Diagnosis present

## 2020-09-11 DIAGNOSIS — S3669XA Other injury of rectum, initial encounter: Secondary | ICD-10-CM
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
## 2020-09-11 NOTE — Discharge Instructions (Addendum)
For rectal tear you can do the Sitz path, increase fiber intake and use stool softeners. IF it causes you to have any pain then please see PCP for further management.   If you feel unsafe again please use the resources.  I hope you feel better!  Get help right away if you: Feel like you are in immediate danger. Feel like you may hurt yourself or others. If you ever feel like you may hurt yourself or others, or have thoughts about taking your own life, get help right away. You can go to your nearest emergency department or call: Your local emergency services (911 in the [place] A suicide crisis helpline, such as the [facility] at 219-470-3489. This is open 24 hours a day.
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
## 2020-10-09 DIAGNOSIS — R7303 Prediabetes: Secondary | ICD-10-CM | POA: Insufficient documentation

## 2020-10-19 ENCOUNTER — Ambulatory Visit: Payer: Medicaid Other | Admitting: Physical Therapy

## 2021-03-28 NOTE — Progress Notes (Signed)
New Patient Note  RE: Allison Chandler MRN: [id number] DOB: 06-05-1990 Date of Office Visit: 03/29/2021  Consult requested by: Willeen Niece, [place] Primary care provider: Willeen Niece, [place]  Chief Complaint: Sinus Problem, Allergy Testing (2021 - was allergy tested .  She was referred here by PCP for congestion and sinus pressure, itching, watery eyes, ), and Asthma (Was never diagnosed with asthma, but has an albuterol inhaler )  History of Present Illness: I had the pleasure of seeing Sama Arauz for initial evaluation at the [facility] [place] [facility] [place] on 03/29/2021. She is a 30 y.o. female, who is referred here by Willeen Niece, PA for the evaluation of environmental allergies. She is accompanied today by her sister.   Rhinitis:  She reports symptoms of nasal congestion, sinus pressure, rhinorrhea, itchy/watery eyes. Symptoms have been going on for 10 years. The symptoms are present all year around with worsening in spring and fall. Anosmia: no. Headache: yes. She has used zyrtec, Claritin, Flonase, Singulair, OTC eye drops with fair improvement in symptoms. Sinus infections: none. Previous work up includes: 2021 skin testing at [facility] was positive to peanuts, milk, almonds, soy, beans, environmental allergens per patient report. No prior AIT. Previous ENT evaluation: no. Previous sinus imaging: no. History of nasal polyps: no. Last eye exam: this year. History of reflux: denies.  Respiratory: She reports symptoms of shortness of breath, coughing, wheezing, nocturnal awakenings for 5+ years. Current medications include albuterol prn which help. She reports not using aerochamber with inhalers. She tried the following inhalers: none. Main triggers are unknown. Frequency of symptoms: daily. Frequency of SABA use: daily x 3-6 months. Interference with physical activity: no. Sleep is disturbed. In the last 12 months, emergency room visits/urgent care visits/doctor  office visits or hospitalizations due to respiratory issues: 0. In the last 12 months, oral steroids courses: one. Lifetime history of hospitalization for respiratory issues: no. Prior intubations: no. History of pneumonia: no. She was evaluated by allergist in the past. Smoking exposure: vapes. Up to date with flu vaccine: no. Up to date with COVID-19 vaccine: yes. Prior Covid-19 infection: no.  Assessment and Plan: Lenyx is a 30 y.o. female with: Other allergic rhinitis Perennial rhinoconjunctivitis symptoms for 10 years with worsening the spring and fall.  Tried Zyrtec, Claritin, Flonase and Singulair with some benefit.  Skin testing in 2021 showed multiple positives per patient report.  No prior AIT. Requesting records from previous allergist.  Today's skin testing showed: Positive to dust mites, cockroach. Start environmental control measures as below. Use over the counter antihistamines such as Zyrtec (cetirizine), Allegra (fexofenadine), or Xyzal (levocetirizine) daily as needed. May take twice a day during allergy flares. May switch antihistamines every few months. Use Flonase (fluticasone) nasal spray 1 spray per nostril twice a day as needed for nasal congestion.  Use azelastine nasal spray 1-2 sprays per nostril twice a day as needed for runny nose/drainage. Continue Singulair (montelukast) 10mg  daily at night. Use olopatadine eye drops 0.1% twice a day as needed for itchy/watery eyes. Consider allergy injections for long term control if above medications do not help the symptoms - handout given.   Allergic conjunctivitis of both eyes See assessment and plan as above.  Other adverse food reactions, not elsewhere classified, subsequent encounter Currently avoiding dairy, peanuts, almond, soy, kidney beans and shellfish due to positive skin testing in the past.  Previously tolerated all except for dairy which caused GI issues. Today's skin testing showed: Borderline to oysters.  Negative to other foods. Continue to avoid above foods for now - no change in diet. I will review records and based on that will make recommendations regarding re-introduction.  Shortness of breath Shortness of breath, coughing, wheezing and nocturnal awakenings for 5 years.  Using albuterol daily for the last 3 to 6 months with some benefit.  No other  inhaler use. Today's spirometry showed some mild restriction with no improvement in FEV1 post bronchodilator treatment.  However clinically feeling improved.  She did use her own albuterol 2 hours before appointment. Encouraged smoking/vaping cessation. Daily controller medication(s): Start Advair 157mcg 2 puffs twice a day with spacer and rinse mouth afterwards. May use albuterol rescue inhaler 2 puffs every 4 to 6 hours as needed for shortness of breath, chest tightness, coughing, and wheezing. May use albuterol rescue inhaler 2 puffs 5 to 15 minutes prior to strenuous physical activities. Monitor frequency of use.  Get spirometry at next visit.  Return in about 2 months (around 05/29/2021).  Meds ordered this encounter  Medications   olopatadine (PATANOL) 0.1 % ophthalmic solution    Sig: Place 1 drop into both eyes 2 (two) times daily as needed (itchy/watery eyes).    Dispense:  5 mL    Refill:  5   azelastine (ASTELIN) 0.1 % nasal spray    Sig: Place 1-2 sprays into both nostrils 2 (two) times daily as needed (nasal drainage). Use in each nostril as directed    Dispense:  30 mL    Refill:  5   fluticasone-salmeterol (ADVAIR HFA) 115-21 MCG/ACT inhaler    Sig: Inhale 2 puffs into the lungs 2 (two) times daily. with spacer and rinse mouth afterwards.    Dispense:  1 each    Refill:  3   levocetirizine (XYZAL) 5 MG tablet    Sig: Take 1 tablet (5 mg total) by mouth every evening.    Dispense:  30 tablet    Refill:  5    Lab Orders  No laboratory test(s) ordered today    Other allergy screening: Food allergy: currently avoiding  milk, peanuts, almonds, soy, kidney beans, shellfish due to positive skin testing in the past.  Patient used to eat the above foods with no issues except for dairy causes GI issues.  Dietary History: patient has been eating other foods including eggs, limited sesame, wheat, meats, fruits and vegetables.  Medication allergy: yes Hymenoptera allergy: no Urticaria: no Eczema:no History of recurrent infections suggestive of immunodeficency: no  Diagnostics: Spirometry:  Tracings reviewed. Her effort: It was hard to get consistent efforts and there is a question as to whether this reflects a maximal maneuver. FVC: 2.84L FEV1: 2.55L, 82% predicted FEV1/FVC ratio: 90% Interpretation: Spirometry consistent with possible restrictive disease with no improvement in FEV1 post bronchodilator treatment. Clinically feeling improved.   Please see scanned spirometry results for details.  Skin Testing: Environmental allergy panel and select foods. Positive to dust mites, cockroach. Borderline to oysters. Negative to other foods. Results discussed with patient/family.  Airborne Adult Perc - 03/29/21 1502     Time Antigen Placed 1502    Allergen Manufacturer Lavella Hammock    Location Back    Number of Test 59    1. Control-Buffer 50% Glycerol Negative    2. Control-Histamine 1 mg/ml 2+    3. Albumin saline Negative    4. [place] Negative    5. [place] Negative    6. Johnson Negative    7. [place] Blue Negative  8. Meadow Fescue Negative    9. Perennial Rye Negative    10. Sweet Vernal Negative    11. Timothy Negative    12. Cocklebur Negative    13. Burweed Marshelder Negative    14. Ragweed, short Negative    15. Ragweed, Giant Negative    16. Plantain,  English Negative    17. Lamb's Quarters Negative    18. Sheep Sorrell Negative    19. Rough Pigweed Negative    20. Marsh Elder, Rough Negative    21. Mugwort, Common Negative    22. Ash mix Negative    23. Birch mix Negative    24. Beech  American Negative    25. Box, Elder Negative    26. Cedar, red Negative    27. Cottonwood, [place] Negative    28. Elm mix Negative    29. Hickory Negative    30. Maple mix Negative    31. Oak, [place] mix Negative    32. Pecan Pollen Negative    33. Pine mix Negative    34. Sycamore Eastern Negative    35. [place], Black Pollen Negative    36. Alternaria alternata Negative    37. Cladosporium Herbarum Negative    38. Aspergillus mix Negative    39. Penicillium mix Negative    40. Bipolaris sorokiniana (Helminthosporium) Negative    41. Drechslera spicifera (Curvularia) Negative    42. Mucor plumbeus Negative    43. Fusarium moniliforme Negative    44. Aureobasidium pullulans (pullulara) Negative    45. Rhizopus oryzae Negative    46. Botrytis cinera Negative    47. Epicoccum nigrum Negative    48. Phoma betae Negative    49. Candida Albicans Negative    50. Trichophyton mentagrophytes Negative    51. Mite, D Farinae  5,000 AU/ml Negative    52. Mite, D Pteronyssinus  5,000 AU/ml Negative    53. Cat Hair 10,000 BAU/ml Negative    54.  Dog Epithelia Negative    55. Mixed Feathers Negative    56. Horse Epithelia Negative    57. Cockroach, German Negative    58. Mouse Negative    59. Tobacco Leaf Negative             Intradermal - 03/29/21 1505     Time Antigen Placed 1506    Allergen Manufacturer Lavella Hammock    Location Arm    Number of Test 15    Control Negative    Guatemala Negative    Johnson Negative    7 Grass Negative    Ragweed mix Negative    Weed mix Negative    Tree mix Negative    Mold 1 Negative    Mold 2 Negative    Mold 3 Negative    Mold 4 Negative    Cat Negative    Dog Negative    Cockroach 2+    Mite mix 2+             Food Adult Perc - 03/29/21 1500     Time Antigen Placed 1502    Allergen Manufacturer Lavella Hammock    Location Back    Control-Histamine 1 mg/ml 2+    1. Peanut Negative    2. Soybean Negative    5. Milk, cow Negative    7.  Casein Negative    8. Shellfish Mix Negative    9. Fish Mix Negative    10. Cashew Negative    11. Morrison Negative  [place] Negative    13. Almond Negative    14. Hazelnut Negative    15. [place] nut Negative    16. Coconut Negative    17. Pistachio Negative    18. Catfish Negative    19. Bass Negative    20. Trout Negative    21. Tuna Negative    22. Salmon Negative    23. Flounder Negative    24. Codfish Negative    25. Shrimp Negative    26. Crab Negative    27. Lobster Negative    28. Oyster --   +/-   29. Scallops Negative    46. Navy Bean Negative             Past Medical History: Patient Active Problem List   Diagnosis Date Noted   Other allergic rhinitis 03/29/2021   Allergic conjunctivitis of both eyes 03/29/2021   Shortness of breath 03/29/2021   Other adverse food reactions, not elsewhere classified, subsequent encounter 03/29/2021   RUQ abdominal pain 09/10/2019   Rectal bleeding 09/10/2019   SIRS (systemic inflammatory response syndrome) ([place]) 04/09/2018   Bipolar disorder ([place]) 04/09/2018   ADD (attention deficit disorder) 04/09/2018   Upper respiratory tract infection 08/13/2017   Past Medical History:  Diagnosis Date   Angio-edema    Anxiety    Asthma    Back pain, chronic    Bipolar 1 disorder (HCC)    Depression    HTN (hypertension)    PCOD (polycystic ovarian disease)    Past Surgical History: Past Surgical History:  Procedure Laterality Date   NO PAST SURGERIES     Medication List:  Current Outpatient Medications  Medication Sig Dispense Refill   acetaminophen (TYLENOL) 500 MG tablet Take 1,000 mg by mouth every 6 (six) hours as needed for moderate pain or headache. Every 6 to 8 hours as needed for pain     albuterol (VENTOLIN HFA) 108 (90 Base) MCG/ACT inhaler Inhale 2 puffs into the lungs every 6 (six) hours as needed for wheezing or shortness of breath.      azelastine (ASTELIN) 0.1 % nasal spray Place 1-2 sprays  into both nostrils 2 (two) times daily as needed (nasal drainage). Use in each nostril as directed 30 mL 5   fluticasone-salmeterol (ADVAIR HFA) 115-21 MCG/ACT inhaler Inhale 2 puffs into the lungs 2 (two) times daily. with spacer and rinse mouth afterwards. 1 each 3   levocetirizine (XYZAL) 5 MG tablet Take 1 tablet (5 mg total) by mouth every evening. 30 tablet 5   montelukast (SINGULAIR) 10 MG tablet Take 1 tablet by mouth daily.     olopatadine (PATANOL) 0.1 % ophthalmic solution Place 1 drop into both eyes 2 (two) times daily as needed (itchy/watery eyes). 5 mL 5   propranolol (INDERAL) 20 MG tablet Take 20 mg by mouth 2 (two) times daily.     tiZANidine (ZANAFLEX) 4 MG tablet Take 4 mg by mouth at bedtime as needed for muscle spasms.      valACYclovir (VALTREX) 1000 MG tablet Take 1,000 mg by mouth 2 (two) times daily.     VRAYLAR 4.5 MG CAPS Take 1 capsule by mouth every evening.      amLODipine (NORVASC) 5 MG tablet Take 1 tablet by mouth every evening.      etonogestrel-ethinyl estradiol (NUVARING) 0.12-0.015 MG/24HR vaginal ring Place 1 each vaginally every 30 (thirty) days. (Patient not taking: No sig reported)     FLUoxetine (PROZAC) 40 MG capsule  Take 40 mg by mouth daily.     levonorgestrel (MIRENA) 20 MCG/24HR IUD 1 each by Intrauterine route once.     LINZESS 72 MCG capsule Take 72 mcg by mouth daily.     ondansetron (ZOFRAN ODT) 4 MG disintegrating tablet Take 1 tablet (4 mg total) by mouth every 8 (eight) hours as needed. (Patient taking differently: Take 4 mg by mouth every 8 (eight) hours as needed for nausea or vomiting.) 10 tablet 0   No current facility-administered medications for this visit.   Allergies: Allergies  Allergen Reactions   Wellbutrin [Bupropion] Rash   Social History: Social History   Socioeconomic History   Marital status: Single    Spouse name: Not on file   Number of children: 1   Years of education: Not on file   Highest education level: Not on  file  Occupational History   Occupation: Environmental consultant  Tobacco Use   Smoking status: Every Day    Packs/day: 1.00    Types: Cigarettes   Smokeless tobacco: Never  Vaping Use   Vaping Use: Some days   Substances: Nicotine, Flavoring  Substance and Sexual Activity   Alcohol use: Yes    Comment: occasionally   Drug use: No   Sexual activity: Yes    Birth control/protection: I.U.D.  Other Topics Concern   Not on file  Social History Narrative   Not on file   Social Determinants of Health   Financial Resource Strain: Not on file  Food Insecurity: Not on file  Transportation Needs: Not on file  Physical Activity: Not on file  Stress: Not on file  Social Connections: Not on file   Lives in a townhouse. Smoking: yes since 2011 Occupation: delivery driver  Environmental History: Water Damage/mildew in the house: no Carpet in the family room: no Carpet in the bedroom: no Heating: gas Cooling: central Pet: yes 3 cats  x February 2022  Family History: Family History  Problem Relation Age of Onset   Hypertension Mother    Ulcerative colitis Mother    Diabetes Mother    Heart disease Mother    Hyperlipidemia Mother    Problem                               Relation Asthma                                   No  Eczema                                No  Food allergy                          No  Allergic rhino conjunctivitis     No  Review of Systems  Constitutional:  Negative for appetite change, chills, fever and unexpected weight change.  HENT:  Positive for congestion, rhinorrhea and sinus pressure.   Eyes:  Negative for itching.  Respiratory:  Positive for cough, shortness of breath and wheezing.   Cardiovascular:  Negative for chest pain.  Gastrointestinal:  Negative for abdominal pain.  Genitourinary:  Negative for difficulty urinating.  Skin:  Negative for rash.  Allergic/Immunologic: Positive for environmental allergies.  Neurological:  Positive for  headaches.   Objective:  BP 120/74   Pulse 76   Temp 98.2 F (36.8 C)   Resp 18   Ht 5\' 3"  (1.6 m)   Wt 215 lb 12.8 oz (97.9 kg)   SpO2 99%   BMI 38.23 kg/m  Body mass index is 38.23 kg/m. Physical Exam Vitals and nursing note reviewed.  Constitutional:      Appearance: Normal appearance. She is well-developed.  HENT:     Head: Normocephalic and atraumatic.     Right Ear: Tympanic membrane and external ear normal.     Left Ear: Tympanic membrane and external ear normal.     Nose: Nose normal.     Mouth/Throat:     Mouth: Mucous membranes are moist.     Pharynx: Oropharynx is clear.  Eyes:     Conjunctiva/sclera: Conjunctivae normal.  Cardiovascular:     Rate and Rhythm: Normal rate and regular rhythm.     Heart sounds: Normal heart sounds. No murmur heard.   No friction rub. No gallop.  Pulmonary:     Effort: Pulmonary effort is normal.     Breath sounds: Normal breath sounds. No wheezing, rhonchi or rales.  Musculoskeletal:     Cervical back: Neck supple.  Skin:    General: Skin is warm.     Findings: No rash.  Neurological:     Mental Status: She is alert and oriented to person, place, and time.  Psychiatric:        Behavior: Behavior normal.  The plan was reviewed with the patient/family, and all questions/concerned were addressed.  It was my pleasure to see Maigan today and participate in her care. Please feel free to contact me with any questions or concerns.  Sincerely,  Rexene Alberts, DO Allergy & Immunology  [facility] office: [place] office: [phone]

## 2021-03-29 ENCOUNTER — Other Ambulatory Visit: Payer: Self-pay

## 2021-03-29 ENCOUNTER — Ambulatory Visit (INDEPENDENT_AMBULATORY_CARE_PROVIDER_SITE_OTHER): Payer: Medicaid Other | Admitting: Allergy

## 2021-03-29 ENCOUNTER — Encounter: Payer: Self-pay | Admitting: Allergy

## 2021-03-29 VITALS — BP 120/74 | HR 76 | Temp 98.2°F | Resp 18 | Ht 63.0 in | Wt 215.8 lb

## 2021-03-29 DIAGNOSIS — H1013 Acute atopic conjunctivitis, bilateral: Secondary | ICD-10-CM

## 2021-03-29 DIAGNOSIS — T781XXD Other adverse food reactions, not elsewhere classified, subsequent encounter: Secondary | ICD-10-CM | POA: Diagnosis not present

## 2021-03-29 DIAGNOSIS — J3089 Other allergic rhinitis: Secondary | ICD-10-CM | POA: Diagnosis not present

## 2021-03-29 DIAGNOSIS — R0602 Shortness of breath: Secondary | ICD-10-CM | POA: Diagnosis not present

## 2021-03-29 MED ORDER — AZELASTINE HCL 0.1 % NA SOLN: 1.0000 | Freq: Two times a day (BID) | NASAL | 5 refills | Status: DC | PRN

## 2021-03-29 MED ORDER — ADVAIR HFA 115-21 MCG/ACT IN AERO: 2.0000 | INHALATION_SPRAY | Freq: Two times a day (BID) | RESPIRATORY_TRACT | 3 refills | Status: DC

## 2021-03-29 MED ORDER — LEVOCETIRIZINE DIHYDROCHLORIDE 5 MG PO TABS: 5.0000 mg | ORAL_TABLET | Freq: Every evening | ORAL | 5 refills | Status: DC

## 2021-03-29 MED ORDER — OLOPATADINE HCL 0.1 % OP SOLN: 1.0000 [drp] | Freq: Two times a day (BID) | OPHTHALMIC | 5 refills | Status: DC | PRN

## 2021-03-29 NOTE — Patient Instructions (Addendum)
Today's skin testing showed: Positive to dust mites, cockroach. Borderline to oysters. Negative to other foods. Results given.   I will review your other allergist's records.  Environmental allergies Start environmental control measures as below. Use over the counter antihistamines such as Zyrtec (cetirizine), Allegra (fexofenadine), or Xyzal (levocetirizine) daily as needed. May take twice a day during allergy flares. May switch antihistamines every few months. Use Flonase (fluticasone) nasal spray 1 spray per nostril twice a day as needed for nasal congestion.  Use azelastine nasal spray 1-2 sprays per nostril twice a day as needed for runny nose/drainage. Continue Singulair (montelukast) 10mg  daily at night. Use olopatadine eye drops 0.1% twice a day as needed for itchy/watery eyes. Consider allergy injections for long term control if above medications do not help the symptoms - handout given.   Breathing:  Stop vaping.  Daily controller medication(s): Start Advair 179mcg 2 puffs twice a day with spacer and rinse mouth afterwards. May use albuterol rescue inhaler 2 puffs every 4 to 6 hours as needed for shortness of breath, chest tightness, coughing, and wheezing. May use albuterol rescue inhaler 2 puffs 5 to 15 minutes prior to strenuous physical activities. Monitor frequency of use.  Breathing  control goals:  Full participation in all desired activities (may need albuterol before activity) Albuterol use two times or less a week on average (not counting use with activity) Cough interfering with sleep two times or less a month Oral steroids no more than once a year No hospitalizations   Food: Continue to avoid foods for now - no change in diet. I will review records and based on that will make recommendations regarding the foods.  Follow up in 2 months or sooner if needed.    Control of House Dust Mite Allergen Dust mite allergens are a common trigger of allergy and asthma  symptoms. While they can be found throughout the house, these microscopic creatures thrive in warm, humid environments such as bedding, upholstered furniture and carpeting. Because so much time is spent in the bedroom, it is essential to reduce mite levels there.  Encase pillows, mattresses, and box springs in special allergen-proof fabric covers or airtight, zippered plastic covers.  Bedding should be washed weekly in hot water (130 F) and dried in a hot dryer. Allergen-proof covers are available for comforters and pillows that can't be regularly washed.  Wash the allergy-proof covers every few months. Minimize clutter in the bedroom. Keep pets out of the bedroom.  Keep humidity less than 50% by using a dehumidifier or air conditioning. You can buy a humidity measuring device called a hygrometer to monitor this.  If possible, replace carpets with hardwood, linoleum, or washable area rugs. If that's not possible, vacuum frequently with a vacuum that has a HEPA filter. Remove all upholstered furniture and non-washable window drapes from the bedroom. Remove all non-washable stuffed toys from the bedroom.  Wash stuffed toys weekly.  Cockroach Allergen Avoidance Cockroaches are often found in the homes of densely populated urban areas, schools or commercial buildings, but these creatures can lurk almost anywhere. This does not mean that you have a dirty house or living area. Block all areas where roaches can enter the home. This includes crevices, wall cracks and windows.  Cockroaches need water to survive, so fix and seal all leaky faucets and pipes. Have an exterminator go through the house when your family and pets are gone to eliminate any remaining roaches. Keep food in lidded containers and put pet food dishes  away after your pets are done eating. Vacuum and sweep the floor after meals, and take out garbage and recyclables. Use lidded garbage containers in the kitchen. Wash dishes immediately after  use and clean under stoves, refrigerators or toasters where crumbs can accumulate. Wipe off the stove and other kitchen surfaces and cupboards regularly.

## 2021-03-29 NOTE — Assessment & Plan Note (Signed)
Shortness of breath, coughing, wheezing and nocturnal awakenings for 5 years.  Using albuterol daily for the last 3 to 6 months with some benefit.  No other  inhaler use.  Today's spirometry showed some mild restriction with no improvement in FEV1 post bronchodilator treatment.  However clinically feeling improved.  She did use her own albuterol 2 hours before appointment. . Encouraged smoking/vaping cessation. . Daily controller medication(s): Start Advair 157mcg 2 puffs twice a day with spacer and rinse mouth afterwards. . May use albuterol rescue inhaler 2 puffs every 4 to 6 hours as needed for shortness of breath, chest tightness, coughing, and wheezing. May use albuterol rescue inhaler 2 puffs 5 to 15 minutes prior to strenuous physical activities. Monitor frequency of use.  . Get spirometry at next visit.

## 2021-03-29 NOTE — Assessment & Plan Note (Signed)
Perennial rhinoconjunctivitis symptoms for 10 years with worsening the spring and fall.  Tried Zyrtec, Claritin, Flonase and Singulair with some benefit.  Skin testing in 2021 showed multiple positives per patient report.  No prior AIT.  Requesting records from previous allergist.   Today's skin testing showed: Positive to dust mites, cockroach.  Start environmental control measures as below.  Use over the counter antihistamines such as Zyrtec (cetirizine), Allegra (fexofenadine), or Xyzal (levocetirizine) daily as needed. May take twice a day during allergy flares. May switch antihistamines every few months. . Use Flonase (fluticasone) nasal spray 1 spray per nostril twice a day as needed for nasal congestion.  . Use azelastine nasal spray 1-2 sprays per nostril twice a day as needed for runny nose/drainage.  Continue Singulair (montelukast) 10mg  daily at night. . Use olopatadine eye drops 0.1% twice a day as needed for itchy/watery eyes.  Consider allergy injections for long term control if above medications do not help the symptoms - handout given.

## 2021-03-29 NOTE — Assessment & Plan Note (Addendum)
Currently avoiding dairy, peanuts, almond, soy, kidney beans and shellfish due to positive skin testing in the past.  Previously tolerated all except for dairy which caused GI issues.  Today's skin testing showed: Borderline to oysters. Negative to other foods.  Continue to avoid above foods for now - no change in diet.  I will review records and based on that will make recommendations regarding re-introduction.

## 2021-03-29 NOTE — Assessment & Plan Note (Signed)
.   See assessment and plan as above. 

## 2021-04-11 ENCOUNTER — Encounter: Payer: Self-pay | Admitting: Allergy

## 2021-04-11 NOTE — Progress Notes (Signed)
Reviewed notes from  prior allergy testing results . Date of service: 10/18/2019. See scanned notes for full documentation. Skin testing positive to cat, dog, tree, weed pollen. Food panel positive to peanut, soy, kidney bean, thea sirensis, shellfish mix, crab, fish mix, almond, walnut, milk.

## 2021-06-10 ENCOUNTER — Emergency Department (HOSPITAL_COMMUNITY): Payer: Medicaid Other

## 2021-06-10 ENCOUNTER — Other Ambulatory Visit: Payer: Self-pay

## 2021-06-10 ENCOUNTER — Encounter (HOSPITAL_COMMUNITY): Payer: Self-pay

## 2021-06-10 ENCOUNTER — Emergency Department (HOSPITAL_COMMUNITY)
Admission: EM | Admit: 2021-06-10 | Discharge: 2021-06-10 | Disposition: A | Discharge status: Home / Self Care | Payer: Medicaid Other | Attending: Emergency Medicine | Admitting: Emergency Medicine

## 2021-06-10 DIAGNOSIS — N200 Calculus of kidney: Secondary | ICD-10-CM

## 2021-06-10 DIAGNOSIS — M545 Low back pain, unspecified: Secondary | ICD-10-CM | POA: Diagnosis present

## 2021-06-10 LAB — URINALYSIS, ROUTINE W REFLEX MICROSCOPIC
Bilirubin Urine: NEGATIVE
Glucose, UA: NEGATIVE mg/dL
Ketones, ur: 5 mg/dL — AB
Leukocytes,Ua: NEGATIVE
Nitrite: NEGATIVE
Protein, ur: NEGATIVE mg/dL
RBC / HPF: >50 RBC/hpf — ABNORMAL HIGH (ref 0–5)
Specific Gravity, Urine: 1.023 (ref 1.005–1.030)
pH: 7 (ref 5.0–8.0)

## 2021-06-10 LAB — HCG, QUANTITATIVE, PREGNANCY: hCG, Beta Chain, Quant, S: <1 m[IU]/mL (ref <5)

## 2021-06-10 LAB — BASIC METABOLIC PANEL
Anion gap: 9 (ref 5–15)
BUN: 10 mg/dL (ref 6–20)
CO2: 23 mmol/L (ref 22–32)
Calcium: 9 mg/dL (ref 8.9–10.3)
Chloride: 105 mmol/L (ref 98–111)
Creatinine, Ser: 0.9 mg/dL (ref 0.44–1.00)
GFR, Estimated: >60 mL/min (ref >60)
Glucose, Bld: 140 mg/dL — ABNORMAL HIGH (ref 70–99)
Potassium: 4.1 mmol/L (ref 3.5–5.1)
Sodium: 137 mmol/L (ref 135–145)

## 2021-06-10 LAB — CBC WITH DIFFERENTIAL/PLATELET
Abs Immature Granulocytes: 0.1 10*3/uL — ABNORMAL HIGH (ref 0.00–0.07)
Basophils Absolute: 0.1 10*3/uL (ref 0.0–0.1)
Basophils Relative: 1 %
Eosinophils Absolute: 0.1 10*3/uL (ref 0.0–0.5)
Eosinophils Relative: 0 %
HCT: 41.5 % (ref 36.0–46.0)
Hemoglobin: 13.4 g/dL (ref 12.0–15.0)
Immature Granulocytes: 1 %
Lymphocytes Relative: 7 %
Lymphs Abs: 1.2 10*3/uL (ref 0.7–4.0)
MCH: 28.9 pg (ref 26.0–34.0)
MCHC: 32.3 g/dL (ref 30.0–36.0)
MCV: 89.4 fL (ref 80.0–100.0)
Monocytes Absolute: 0.9 10*3/uL (ref 0.1–1.0)
Monocytes Relative: 5 %
Neutro Abs: 15.7 10*3/uL — ABNORMAL HIGH (ref 1.7–7.7)
Neutrophils Relative %: 86 %
Platelets: 287 10*3/uL (ref 150–400)
RBC: 4.64 MIL/uL (ref 3.87–5.11)
RDW: 12.8 % (ref 11.5–15.5)
WBC: 18.1 10*3/uL — ABNORMAL HIGH (ref 4.0–10.5)
nRBC: 0 % (ref 0.0–0.2)

## 2021-06-10 LAB — PREGNANCY, URINE: Preg Test, Ur: NEGATIVE

## 2021-06-10 MED ORDER — KETOROLAC TROMETHAMINE 15 MG/ML IJ SOLN
15.0000 mg | Freq: Once | INTRAMUSCULAR | Status: AC
  Administered 2021-06-10: 15 mg via INTRAVENOUS
  Filled 2021-06-10: qty 1

## 2021-06-10 MED ORDER — HYDROCODONE-ACETAMINOPHEN 5-325 MG PO TABS: 1.0000 | ORAL_TABLET | Freq: Four times a day (QID) | ORAL | 0 refills | Status: DC | PRN

## 2021-06-10 MED ORDER — HYDROMORPHONE HCL 1 MG/ML IJ SOLN
1.0000 mg | Freq: Once | INTRAMUSCULAR | Status: AC
  Administered 2021-06-10: 1 mg via INTRAVENOUS
  Filled 2021-06-10: qty 1

## 2021-06-10 MED ORDER — ONDANSETRON 4 MG PO TBDP: 4.0000 mg | ORAL_TABLET | Freq: Three times a day (TID) | ORAL | 0 refills | Status: DC | PRN

## 2021-06-10 MED ORDER — KETOROLAC TROMETHAMINE 10 MG PO TABS: 10.0000 mg | ORAL_TABLET | Freq: Three times a day (TID) | ORAL | 0 refills | Status: DC | PRN

## 2021-06-10 MED ORDER — FENTANYL CITRATE PF 50 MCG/ML IJ SOSY
50.0000 ug | PREFILLED_SYRINGE | Freq: Once | INTRAMUSCULAR | Status: AC
  Administered 2021-06-10: 50 ug via INTRAVENOUS
  Filled 2021-06-10: qty 1

## 2021-06-10 MED ORDER — TAMSULOSIN HCL 0.4 MG PO CAPS: 0.4000 mg | ORAL_CAPSULE | Freq: Every day | ORAL | 0 refills | Status: DC

## 2021-06-10 MED ORDER — ONDANSETRON HCL 4 MG/2ML IJ SOLN
4.0000 mg | Freq: Once | INTRAMUSCULAR | Status: AC
  Administered 2021-06-10: 4 mg via INTRAVENOUS
  Filled 2021-06-10: qty 2

## 2021-06-10 NOTE — ED Triage Notes (Signed)
Patient BIB EMS from home with complaint of lower back and bladder pain with difficulty urinating. Pt endorses N/V, began around 2200. 20 g RAC, 100 mcg fentanyl, 4 mg zofran given en route  EMS vitals: BP 130/82 HR 66 RR 18 SPO2 99% RA

## 2021-06-10 NOTE — Discharge Instructions (Signed)
Take Flomax daily to help encourage urination. Use Toradol as needed for mild to moderate pain.  Take this 3 times a day with meals. Use Norco as needed for severe breakthrough pain.  Have caution, this is a narcotic pain medicine.  Not drive or operate heavy machinery while taking this medicine Use Zofran as needed for nausea or vomiting. Make sure you stay well-hydrated with water. Call the urologist listed below to set up a follow-up appointment. Return to the emergency room for high fevers, persistent vomiting, severe worsening pain, inability urinate, any new, worsening, concerning symptoms

## 2021-06-10 NOTE — ED Provider Notes (Signed)
[facility] Emergency Department Provider Note MRN:  [id number]  Arrival date & time: 06/10/21     Chief Complaint   Back Pain and Urinary Pain    History of Present Illness   Allison Chandler is a 31 y.o. year-old female presents to the ED with chief complaint of right flank pain and abdominal pain.  She reports associated urinary hesitancy.  States onset of symptoms was last night.  It was sudden in onset.  She rates the pain as severe.  She has had associated nausea and vomiting.  She denies having felt like this before.  Denies any history of kidney stones.    Review of Systems  Pertinent review of systems noted in HPI.    Physical Exam   Vitals:   06/10/21 0537 06/10/21 0600  BP: (!) 156/94 (!) 156/86  Pulse: 62 (!) 58  Resp: (!) 22 20  Temp: 98.1 F (36.7 C)   SpO2: 93% 96%    CONSTITUTIONAL:  uncomfortable-appearing, NAD NEURO:  Alert and oriented x 3, CN 3-12 grossly intact EYES:  eyes equal and reactive ENT/NECK:  Supple, no stridor  CARDIO:  normal rate, regular rhythm, appears well-perfused  PULM:  No respiratory distress,  GI/GU:  non-distended, no focal abdominal tenderness, mild right CVA tenderness MSK/SPINE:  No gross deformities, no edema, moves all extremities  SKIN:  no rash, atraumatic   *Additional and/or pertinent findings included in MDM below  Diagnostic and Interventional Summary    EKG Interpretation  Date/Time:    Ventricular Rate:    PR Interval:    QRS Duration:   QT Interval:    QTC Calculation:   R Axis:     Text Interpretation:         Labs Reviewed  CBC WITH DIFFERENTIAL/PLATELET  BASIC METABOLIC PANEL  URINALYSIS, ROUTINE W REFLEX MICROSCOPIC  PREGNANCY, URINE  HCG, QUANTITATIVE, PREGNANCY    CT Renal Stone Study    (Results Pending)    Medications  HYDROmorphone (DILAUDID) injection 1 mg (1 mg Intravenous Given 06/10/21 0559)  ondansetron (ZOFRAN) injection 4 mg (4 mg Intravenous Given 06/10/21  0558)     Procedures  /  Critical Care Procedures  ED Course and Medical Decision Making  I have reviewed the triage vital signs, the nursing notes, and pertinent available records from the EMR.  Complexity of Problems Addressed Acute complicated illness or Injury  Additional Data Reviewed and Analyzed Further history obtained from: Past medical history and medications listed in the EMR, Further history from spouse/family member, Recent PCP notes, and Care Everywhere    ED Course    Patient here with sudden onset right flank pain that began last night.  She has had associated nausea and vomiting as well as urinary hesitancy.  Her symptoms sound consistent and concerning for kidney stone.  Will treat pain and nausea.  Will check labs and CT imaging.  Also consider ovarian cyst, torsion, appendicitis, UTI.  At this time, think that the most likely diagnosis is kidney stone.  I ordered labs and CT imaging.  These tests are pending at signout.  Patient signed out to oncoming team, who will continue care.   Final Clinical Impressions(s) / ED Diagnoses     ICD-10-CM   1. Flank pain  R10.9       ED Discharge Orders     None        Discharge Instructions Discussed with and Provided to Patient:   Discharge Instructions   None  Montine Circle, PA-C 06/10/21 0617    Quintella Reichert, MD 06/10/21 [phone]

## 2021-06-10 NOTE — ED Provider Notes (Signed)
Physical Exam  BP (!) 156/86    Pulse (!) 58    Temp 98.1 F (36.7 C) (Oral)    Resp 20    Ht 5\' 3"  (1.6 m)    Wt 95.3 kg    SpO2 96%    BMI 37.20 kg/m   Physical Exam Vitals and nursing note reviewed.  Constitutional:      General: She is not in acute distress.    Appearance: She is well-developed.     Comments: nontoxic  HENT:     Head: Normocephalic and atraumatic.  Eyes:     Extraocular Movements: Extraocular movements intact.  Cardiovascular:     Rate and Rhythm: Normal rate.  Pulmonary:     Effort: Pulmonary effort is normal.  Abdominal:     General: There is no distension.  Musculoskeletal:        General: Normal range of motion.     Cervical back: Normal range of motion.  Skin:    General: Skin is warm.     Findings: No rash.  Neurological:     Mental Status: She is alert and oriented to person, place, and time.    Procedures  Procedures  ED Course / MDM    Medical Decision Making  Patient signed out to me by R. Browning, PA-C.  Please see previous notes for further history.  In brief, patient presenting for evaluation of right-sided flank pain.  Having some urinary urgency, no fevers.  Pending labs and CT to assess for possible kidney stone.  Labs interpreted by me, shows leukocytosis of 18.  However patient is afebrile and urine without infection, most likely reactive.  CT shows kidney stone with associated hydro-.  Kidney function is reassuring.  Will plan on pain control and close outpatient follow-up.  Discussed findings with patient, who is agreeable. At this time, pt appears safe for d/c. Return precautions given. Pt states she understands and agrees to plan.     Results for orders placed or performed during the hospital encounter of 06/10/21  CBC with Differential/Platelet  Result Value Ref Range   WBC 18.1 (H) 4.0 - 10.5 K/uL   RBC 4.64 3.87 - 5.11 MIL/uL   Hemoglobin 13.4 12.0 - 15.0 g/dL   HCT 41.5 36.0 - 46.0 %   MCV 89.4 80.0 - 100.0 fL    MCH 28.9 26.0 - 34.0 pg   MCHC 32.3 30.0 - 36.0 g/dL   RDW 12.8 11.5 - 15.5 %   Platelets 287 150 - 400 K/uL   nRBC 0.0 0.0 - 0.2 %   Neutrophils Relative % 86 %   Neutro Abs 15.7 (H) 1.7 - 7.7 K/uL   Lymphocytes Relative 7 %   Lymphs Abs 1.2 0.7 - 4.0 K/uL   Monocytes Relative 5 %   Monocytes Absolute 0.9 0.1 - 1.0 K/uL   Eosinophils Relative 0 %   Eosinophils Absolute 0.1 0.0 - 0.5 K/uL   Basophils Relative 1 %   Basophils Absolute 0.1 0.0 - 0.1 K/uL   Immature Granulocytes 1 %   Abs Immature Granulocytes 0.10 (H) 0.00 - 0.07 K/uL  Basic metabolic panel  Result Value Ref Range   Sodium 137 135 - 145 mmol/L   Potassium 4.1 3.5 - 5.1 mmol/L   Chloride 105 98 - 111 mmol/L   CO2 23 22 - 32 mmol/L   Glucose, Bld 140 (H) 70 - 99 mg/dL   BUN 10 6 - 20 mg/dL   Creatinine, Ser 0.90 0.44 -  1.00 mg/dL   Calcium 9.0 8.9 - 10.3 mg/dL   GFR, Estimated >60 >60 mL/min   Anion gap 9 5 - 15  Urinalysis, Routine w reflex microscopic Urine, Clean Catch  Result Value Ref Range   Color, Urine YELLOW YELLOW   APPearance HAZY (A) CLEAR   Specific Gravity, Urine 1.023 1.005 - 1.030   pH 7.0 5.0 - 8.0   Glucose, UA NEGATIVE NEGATIVE mg/dL   Hgb urine dipstick MODERATE (A) NEGATIVE   Bilirubin Urine NEGATIVE NEGATIVE   Ketones, ur 5 (A) NEGATIVE mg/dL   Protein, ur NEGATIVE NEGATIVE mg/dL   Nitrite NEGATIVE NEGATIVE   Leukocytes,Ua NEGATIVE NEGATIVE   RBC / HPF >50 (H) 0 - 5 RBC/hpf   WBC, UA 0-5 0 - 5 WBC/hpf   Bacteria, UA RARE (A) NONE SEEN   Squamous Epithelial / LPF 6-10 0 - 5   Mucus PRESENT   Pregnancy, urine  Result Value Ref Range   Preg Test, Ur NEGATIVE NEGATIVE  hCG, quantitative, pregnancy  Result Value Ref Range   hCG, Beta Chain, Quant, S <1 <5 mIU/mL   CT Renal Stone Study  Result Date: 06/10/2021 CLINICAL DATA:  31 year old female with flank pain and bladder pain. EXAM: CT ABDOMEN AND PELVIS WITHOUT CONTRAST TECHNIQUE: Multidetector CT imaging of the abdomen and pelvis  was performed following the standard protocol without IV contrast. COMPARISON:  Noncontrast CT Abdomen and Pelvis 11/18/2019 and earlier. FINDINGS: Lower chest: Similar lung volumes. Patchy left greater than right lung base opacity more resembles atelectasis than infection. Cardiac size at the upper limits of normal. No pericardial or pleural effusion. Hepatobiliary: Negative noncontrast liver and gallbladder. Pancreas: Negative. Spleen: Negative. Adrenals/Urinary Tract: Normal adrenal glands. Noncontrast left kidney and left ureter appears stable and normal. New right nephromegaly and pararenal inflammatory stranding. Mild right hydronephrosis and right hydroureter with periureteral stranding continuing into the pelvis. Diminutive urinary bladder. And evidence of a punctate calculus at the right ureterovesical junction on coronal image 103, poorly visible on the axial images. No other urinary calculus identified. Stomach/Bowel: Negative large bowel and retrocecal appendix. Decompressed terminal ileum. No dilated small bowel. Decompressed and negative stomach and duodenum. No free air or free fluid. Vascular/Lymphatic: Normal caliber abdominal aorta. No calcified atherosclerosis or lymphadenopathy identified. Reproductive: IUD has been removed since 2021. Otherwise negative noncontrast appearance. Other: No pelvic free fluid. Musculoskeletal: No acute osseous abnormality identified. IMPRESSION: 1. Acute obstructive uropathy on the right due to a punctate calculus at the right UVJ. Possible forniceal rupture. 2. No other urinary calculus identified. 3. Patchy left greater than right lung base opacity more resembles atelectasis than infection. Electronically Signed   By: Genevie Ann M.D.   On: 06/10/2021 07:27           Franchot Heidelberg, PA-C 06/10/21 5003    Teressa Lower, MD 06/10/21 1558

## 2021-06-10 NOTE — ED Notes (Signed)
Pt discharged. Instructions and prescriptions given. AAOX4. Pt in no apparent distress with mild pain. The opportunity to ask questions was provided.  

## 2021-10-23 ENCOUNTER — Other Ambulatory Visit: Payer: Self-pay | Admitting: Allergy

## 2021-10-31 DIAGNOSIS — J4520 Mild intermittent asthma, uncomplicated: Secondary | ICD-10-CM | POA: Insufficient documentation

## 2021-12-12 ENCOUNTER — Emergency Department (HOSPITAL_COMMUNITY)
Admission: EM | Admit: 2021-12-12 | Discharge: 2021-12-12 | Discharge status: Against Medical Advice | Payer: Medicaid Other | Attending: Emergency Medicine | Admitting: Emergency Medicine

## 2021-12-12 ENCOUNTER — Emergency Department (HOSPITAL_COMMUNITY): Payer: Medicaid Other

## 2021-12-12 ENCOUNTER — Encounter (HOSPITAL_COMMUNITY): Payer: Self-pay

## 2021-12-12 DIAGNOSIS — Z5321 Procedure and treatment not carried out due to patient leaving prior to being seen by health care provider: Secondary | ICD-10-CM | POA: Diagnosis not present

## 2021-12-12 DIAGNOSIS — I10 Essential (primary) hypertension: Secondary | ICD-10-CM | POA: Insufficient documentation

## 2021-12-12 DIAGNOSIS — R0602 Shortness of breath: Secondary | ICD-10-CM | POA: Diagnosis not present

## 2021-12-12 DIAGNOSIS — R0781 Pleurodynia: Secondary | ICD-10-CM | POA: Diagnosis present

## 2021-12-12 LAB — BASIC METABOLIC PANEL
Anion gap: 11 (ref 5–15)
BUN: 10 mg/dL (ref 6–20)
CO2: 20 mmol/L — ABNORMAL LOW (ref 22–32)
Calcium: 9.6 mg/dL (ref 8.9–10.3)
Chloride: 107 mmol/L (ref 98–111)
Creatinine, Ser: 0.66 mg/dL (ref 0.44–1.00)
GFR, Estimated: >60 mL/min (ref >60)
Glucose, Bld: 98 mg/dL (ref 70–99)
Potassium: 3.7 mmol/L (ref 3.5–5.1)
Sodium: 138 mmol/L (ref 135–145)

## 2021-12-12 LAB — CBC
HCT: 39.4 % (ref 36.0–46.0)
Hemoglobin: 13.2 g/dL (ref 12.0–15.0)
MCH: 28.9 pg (ref 26.0–34.0)
MCHC: 33.5 g/dL (ref 30.0–36.0)
MCV: 86.2 fL (ref 80.0–100.0)
Platelets: 304 10*3/uL (ref 150–400)
RBC: 4.57 MIL/uL (ref 3.87–5.11)
RDW: 13.1 % (ref 11.5–15.5)
WBC: 10.9 10*3/uL — ABNORMAL HIGH (ref 4.0–10.5)
nRBC: 0 % (ref 0.0–0.2)

## 2021-12-12 LAB — TROPONIN I (HIGH SENSITIVITY): Troponin I (High Sensitivity): 3 ng/L (ref <18)

## 2021-12-12 LAB — HCG, QUANTITATIVE, PREGNANCY: hCG, Beta Chain, Quant, S: <1 m[IU]/mL (ref <5)

## 2021-12-12 LAB — D-DIMER, QUANTITATIVE: D-Dimer, Quant: 0.36 ug/mL-FEU (ref 0.00–0.50)

## 2021-12-12 NOTE — ED Notes (Signed)
Called pt three times pt is not in the waiting no response

## 2021-12-12 NOTE — ED Provider Triage Note (Signed)
Emergency Medicine Provider Triage Evaluation Note  Allison Chandler , a 31 y.o. female  was evaluated in triage.  Pt complains of chest pain, shortness of breath. She states that same began this morning when she woke. Went to her PCP for refill of her blood pressure medication and was sent here with concerns that her blood pressure was high and she was having these symptoms. Denies any history of heart problems. States that her pain is pleuritic and left sided in nature. Denies leg pain, leg swelling, fevers, chills, hemoptysis, recent travel, recent surgeries. No exogenous estrogen use. No history of blood clots  Review of Systems  Positive:  Negative:   Physical Exam  BP (!) 172/124 (BP Location: Right Arm)   Pulse (!) 103   Temp 98.8 F (37.1 C) (Oral)   Resp 16   SpO2 99%  Gen:   Awake, no distress   Resp:  Normal effort  MSK:   Moves extremities without difficulty  Other:    Medical Decision Making  Medically screening exam initiated at 2:19 PM.  Appropriate orders placed.  Allison Chandler was informed that the remainder of the evaluation will be completed by another provider, this initial triage assessment does not replace that evaluation, and the importance of remaining in the ED until their evaluation is complete.  Tachycardic with S1Q3T3 on EKG. D dimer ordered   Bud Face, PA-C 12/12/21 1422

## 2021-12-12 NOTE — ED Triage Notes (Signed)
Pt c/o L sided CP and SOB. Pt was seen at PCP and referred to ED for HTN and abnormal EKG.

## 2021-12-13 ENCOUNTER — Emergency Department (HOSPITAL_COMMUNITY)
Admission: EM | Admit: 2021-12-13 | Discharge: 2021-12-13 | Disposition: A | Discharge status: Home / Self Care | Payer: Medicaid Other | Attending: Emergency Medicine | Admitting: Emergency Medicine

## 2021-12-13 ENCOUNTER — Other Ambulatory Visit: Payer: Self-pay

## 2021-12-13 ENCOUNTER — Encounter (HOSPITAL_COMMUNITY): Payer: Self-pay | Admitting: Emergency Medicine

## 2021-12-13 ENCOUNTER — Emergency Department (HOSPITAL_COMMUNITY): Payer: Medicaid Other

## 2021-12-13 DIAGNOSIS — R079 Chest pain, unspecified: Secondary | ICD-10-CM | POA: Diagnosis present

## 2021-12-13 DIAGNOSIS — Z79899 Other long term (current) drug therapy: Secondary | ICD-10-CM | POA: Diagnosis not present

## 2021-12-13 DIAGNOSIS — J45909 Unspecified asthma, uncomplicated: Secondary | ICD-10-CM | POA: Insufficient documentation

## 2021-12-13 DIAGNOSIS — Z7951 Long term (current) use of inhaled steroids: Secondary | ICD-10-CM | POA: Diagnosis not present

## 2021-12-13 DIAGNOSIS — I10 Essential (primary) hypertension: Secondary | ICD-10-CM | POA: Diagnosis not present

## 2021-12-13 LAB — BASIC METABOLIC PANEL
Anion gap: 9 (ref 5–15)
BUN: 12 mg/dL (ref 6–20)
CO2: 24 mmol/L (ref 22–32)
Calcium: 9.1 mg/dL (ref 8.9–10.3)
Chloride: 108 mmol/L (ref 98–111)
Creatinine, Ser: 0.71 mg/dL (ref 0.44–1.00)
GFR, Estimated: >60 mL/min (ref >60)
Glucose, Bld: 114 mg/dL — ABNORMAL HIGH (ref 70–99)
Potassium: 4 mmol/L (ref 3.5–5.1)
Sodium: 141 mmol/L (ref 135–145)

## 2021-12-13 LAB — CBC
HCT: 37.2 % (ref 36.0–46.0)
Hemoglobin: 12.3 g/dL (ref 12.0–15.0)
MCH: 28.5 pg (ref 26.0–34.0)
MCHC: 33.1 g/dL (ref 30.0–36.0)
MCV: 86.3 fL (ref 80.0–100.0)
Platelets: 284 10*3/uL (ref 150–400)
RBC: 4.31 MIL/uL (ref 3.87–5.11)
RDW: 13.2 % (ref 11.5–15.5)
WBC: 9.9 10*3/uL (ref 4.0–10.5)
nRBC: 0 % (ref 0.0–0.2)

## 2021-12-13 LAB — TROPONIN I (HIGH SENSITIVITY)
Troponin I (High Sensitivity): 3 ng/L (ref <18)
Troponin I (High Sensitivity): 3 ng/L (ref <18)

## 2021-12-13 NOTE — ED Triage Notes (Signed)
Patient BIB [facility] for evaluation of chest pain that started a few days ago. Pain is exacerbated by range of motion, patient is alert, oriented, ambulatory, and in no apparent distress at this time.

## 2021-12-13 NOTE — ED Provider Notes (Signed)
[facility] EMERGENCY DEPARTMENT Provider Note   CSN: [id number] Arrival date & time: 12/13/21  4098     History  Chief Complaint  Patient presents with   Chest Pain    Allison Chandler is a 31 y.o. female.  The history is provided by the patient and medical records. No language interpreter was used.  Chest Pain   30 year old female significant history of bipolar, anxiety, depression, hypertension, asthma, brought here via EMS from home for evaluation of chest pain.  Home Medications Prior to Admission medications   Medication Sig Start Date End Date Taking? Authorizing Provider  acetaminophen (TYLENOL) 500 MG tablet Take 1,000 mg by mouth every 6 (six) hours as needed for moderate pain or headache. Every 6 to 8 hours as needed for pain    [provider]  albuterol (VENTOLIN HFA) 108 (90 Base) MCG/ACT inhaler Inhale 2 puffs into the lungs every 6 (six) hours as needed for wheezing or shortness of breath.  09/02/18   [provider]  azelastine (ASTELIN) 0.1 % nasal spray Place 1-2 sprays into both nostrils 2 (two) times daily as needed (nasal drainage). Use in each nostril as directed 03/29/21   Garnet Sierras, DO  fluticasone-salmeterol (ADVAIR HFA) [id number] MCG/ACT inhaler Inhale 2 puffs into the lungs 2 (two) times daily. with spacer and rinse mouth afterwards. 03/29/21   Garnet Sierras, DO  HYDROcodone-acetaminophen (NORCO/VICODIN) 5-325 MG tablet Take 1 tablet by mouth every 6 (six) hours as needed. 06/10/21   Caccavale, Sophia, PA-C  ketorolac (TORADOL) 10 MG tablet Take 1 tablet (10 mg total) by mouth every 8 (eight) hours as needed. 06/10/21   Caccavale, Sophia, PA-C  levocetirizine (XYZAL) 5 MG tablet Take 1 tablet (5 mg total) by mouth every evening. 03/29/21   Garnet Sierras, DO  montelukast (SINGULAIR) 10 MG tablet Take 1 tablet by mouth daily. 05/25/19   [provider]  olopatadine (PATANOL) 0.1 % ophthalmic solution Place 1 drop into both  eyes 2 (two) times daily as needed (itchy/watery eyes). 03/29/21   Garnet Sierras, DO  ondansetron (ZOFRAN-ODT) 4 MG disintegrating tablet Take 1 tablet (4 mg total) by mouth every 8 (eight) hours as needed for nausea or vomiting. 06/10/21   Caccavale, Sophia, PA-C  propranolol (INDERAL) 20 MG tablet Take 20 mg by mouth daily as needed (for blood pressure). 07/05/19   [provider]  tamsulosin (FLOMAX) 0.4 MG CAPS capsule Take 1 capsule (0.4 mg total) by mouth daily. 06/10/21   Caccavale, Sophia, PA-C  tiZANidine (ZANAFLEX) 4 MG tablet Take 4 mg by mouth at bedtime as needed for muscle spasms.  08/26/19   [provider]  valACYclovir (VALTREX) 1000 MG tablet Take 1,000 mg by mouth 2 (two) times daily. 11/15/19   [provider]  VRAYLAR 4.5 MG CAPS Take 1 capsule by mouth every evening.  09/07/19   [provider]      Allergies    Wellbutrin [bupropion]    Review of Systems   Review of Systems  Cardiovascular:  Positive for chest pain.  All other systems reviewed and are negative.   Physical Exam Updated Vital Signs BP (!) 156/101 (BP Location: Right Arm)   Pulse 65   Temp 98.2 F (36.8 C) (Oral)   Resp 15   SpO2 97%  Physical Exam Vitals and nursing note reviewed.  Constitutional:      General: She is not in acute distress.    Appearance: She is well-developed.  HENT:     Head: Atraumatic.  Eyes:     Conjunctiva/sclera: Conjunctivae normal.  Cardiovascular:     Rate and Rhythm: Normal rate and regular rhythm.  Pulmonary:     Effort: Pulmonary effort is normal.     Breath sounds: No wheezing, rhonchi or rales.  Abdominal:     Palpations: Abdomen is soft.     Tenderness: There is no abdominal tenderness.  Musculoskeletal:     Cervical back: Neck supple.     Right lower leg: No edema.     Left lower leg: No edema.  Skin:    Findings: No rash.  Neurological:     Mental Status: She is alert.  Psychiatric:        Mood and Affect: Mood normal.      ED Results / Procedures / Treatments   Labs (all labs ordered are listed, but only abnormal results are displayed) Labs Reviewed  BASIC METABOLIC PANEL - Abnormal; Notable for the following components:      Result Value   Glucose, Bld 114 (*)    All other components within normal limits  CBC  TROPONIN I (HIGH SENSITIVITY)  TROPONIN I (HIGH SENSITIVITY)    EKG None  Date: 12/13/2021  Rate: 64  Rhythm: normal sinus rhythm  QRS Axis: normal  Intervals: normal  ST/T Wave abnormalities: normal  Conduction Disutrbances: none  Narrative Interpretation:   Old EKG Reviewed: No significant changes noted    Radiology DG Chest 1 View  Result Date: 12/13/2021 CLINICAL DATA:  Chest pain and tightness for 2 days, vaping EXAM: CHEST  1 VIEW COMPARISON:  12/12/2021 FINDINGS: The heart size and mediastinal contours are within normal limits. Both lungs are clear. The visualized skeletal structures are unremarkable. IMPRESSION: No active disease. Electronically Signed   By: Jerilynn Mages.  Shick M.D.   On: 12/13/2021 10:50   DG Chest 2 View  Result Date: 12/12/2021 CLINICAL DATA:  Chest pain and shortness of breath. EXAM: CHEST - 2 VIEW COMPARISON:  08/29/2020 FINDINGS: The cardiac silhouette, mediastinal and hilar contours are normal. The lungs are clear. No pleural effusions. No pulmonary lesions. The bony thorax is intact. IMPRESSION: No acute cardiopulmonary findings. Electronically Signed   By: Marijo Sanes M.D.   On: 12/12/2021 14:48    Procedures Procedures    Medications Ordered in ED Medications - No data to display  ED Course/ Medical Decision Making/ A&P                           Medical Decision Making  BP (!) 156/101 (BP Location: Right Arm)   Pulse 65   Temp 98.2 F (36.8 C) (Oral)   Resp 15   SpO2 97%   4:28 PM This is a 31 year old female significant history of hypertension as well as history of bipolar and asthma brought here via EMS from home for evaluation of chest  pain.  Patient reports she was seen by her PCP yesterday for regular checkup.  She was noted to have elevated blood pressure.  Her doctor also noted some changes in her EKG and would like patient to come to the ER for further assessment.  Although triage note mention the patient endorsed chest pain, patient does not endorse any active chest pain.  She does states she feels a bit "lethargic" for several weeks but denies any fever chills cold symptoms chest pain shortness of breath nausea vomiting diarrhea diaphoresis dizziness lightheadedness dysuria focal numbness  or focal weakness.  She is currently on amlodipine 10 mg p.o. daily.  She is without any other complaint.  On exam patient is well-appearing appears to be in no acute discomfort.  Heart lung sounds normal.  Patient is PERC negative doubt PE.  She has a hear score of 1, low risk of Mace.  Her blood pressure is elevated at 159/102 and this will need to be managed further by her PCP.  Labs, EKG, and imaging independently viewed interpreted by me and I agree with radiology interpretation.  Fortunately EKG shows no concerning arrhythmia or ischemic changes.  Negative delta troponin low risk of Mace.  Electrolyte panels are reassuring, normal WBC, normal H&H, chest x-ray without any active disease.  At this time I felt patient stable to be discharged with close follow-up with her PCP for further management of her elevated blood pressure.  Return precaution given.   This patient presents to the ED for concern of CP, this involves an extensive number of treatment options, and is a complaint that carries with it a high risk of complications and morbidity.  The differential diagnosis includes anxiety, acs, pericarditis, costochondritis, pna, pe, gerd, gastritis  Co morbidities that complicate the patient evaluation htn Additional history obtained:  Additional history obtained from family members External records from outside source obtained and  reviewed including EMR along with labs and imaging  Lab Tests:  I Ordered, and personally interpreted labs.  The pertinent results include:  as above  Imaging Studies ordered:  I ordered imaging studies including CXR I independently visualized and interpreted imaging which showed unremarkable I agree with the radiologist interpretation  Cardiac Monitoring:  The patient was maintained on a cardiac monitor.  I personally viewed and interpreted the cardiac monitored which showed an underlying rhythm of: NSR  Medicines ordered and prescription drug management:    Test Considered: as above  Critical Interventions: as above   Problem List / ED Course: HTN    Reevaluation:  After the interventions noted above, I reevaluated the patient and found that they have :improved  Social Determinants of Health: tobacco use  Dispostion:  After consideration of the diagnostic results and the patients response to treatment, I feel that the patent would benefit from close f/u with PCP.        Final Clinical Impression(s) / ED Diagnoses Final diagnoses:  Hypertension, unspecified type    Rx / DC Orders ED Discharge Orders     None         Domenic Moras, PA-C 12/13/21 1645    Drenda Freeze, MD 12/14/21 [phone]

## 2021-12-13 NOTE — ED Notes (Signed)
Stepped outside

## 2021-12-13 NOTE — Discharge Instructions (Signed)
You have been evaluated for your elevated blood pressure.  Fortunately labs, EKG, and chest x-ray obtained today did not show any concerning finding.  Please follow-up closely with your primary care doctor for further managements of your elevated blood pressure.  Return if you have any concern.

## 2021-12-13 NOTE — ED Provider Triage Note (Signed)
Emergency Medicine Provider Triage Evaluation Note  Allison Chandler , a 31 y.o. female  was evaluated in triage.  Pt complains of chest pain.  States yesterday while she was at her doctor's office she started having left-sided chest pain.  Says she has associated dizziness.  Her doctor wanted her to come to the emergency department, however she ended up going to [place] and left before being seen.  She has the chest pain has been constant.  Moving around makes it worse.  The pain is underneath her left breast.  She has never had this pain before.  Review of Systems  Positive: Chest pain, dizziness Negative:   Physical Exam  BP (!) 153/99 (BP Location: Right Arm)   Pulse (!) 59   Temp 99 F (37.2 C) (Oral)   Resp 16   SpO2 98%  Gen:   Awake, no distress   Resp:  Normal effort  MSK:   Moves extremities without difficulty  Other:  Pain not reproducible. Heart RRR, no murmurs  Medical Decision Making  Medically screening exam initiated at 9:49 AM.  Appropriate orders placed.  Donae Pittmon was informed that the remainder of the evaluation will be completed by another provider, this initial triage assessment does not replace that evaluation, and the importance of remaining in the ED until their evaluation is complete.     Adolphus Birchwood, [place] 12/13/21 [phone]

## 2022-01-08 ENCOUNTER — Other Ambulatory Visit: Payer: Self-pay

## 2022-01-08 ENCOUNTER — Other Ambulatory Visit: Payer: Self-pay | Admitting: Allergy

## 2022-01-28 ENCOUNTER — Ambulatory Visit: Payer: Medicaid Other | Admitting: Allergy

## 2022-02-05 ENCOUNTER — Other Ambulatory Visit: Payer: Self-pay | Admitting: Allergy

## 2022-03-06 ENCOUNTER — Ambulatory Visit: Payer: Medicaid Other | Admitting: Allergy

## 2022-03-24 ENCOUNTER — Other Ambulatory Visit: Payer: Self-pay | Admitting: Allergy

## 2022-04-19 ENCOUNTER — Emergency Department (HOSPITAL_COMMUNITY)
Admission: EM | Admit: 2022-04-19 | Discharge: 2022-04-20 | Disposition: A | Discharge status: Home / Self Care | Payer: Medicaid Other | Attending: Emergency Medicine | Admitting: Emergency Medicine

## 2022-04-19 ENCOUNTER — Other Ambulatory Visit: Payer: Self-pay

## 2022-04-19 ENCOUNTER — Encounter (HOSPITAL_COMMUNITY): Payer: Self-pay

## 2022-04-19 ENCOUNTER — Emergency Department (HOSPITAL_COMMUNITY): Payer: Medicaid Other

## 2022-04-19 DIAGNOSIS — Y92096 Garden or yard of other non-institutional residence as the place of occurrence of the external cause: Secondary | ICD-10-CM | POA: Insufficient documentation

## 2022-04-19 DIAGNOSIS — S82831A Other fracture of upper and lower end of right fibula, initial encounter for closed fracture: Secondary | ICD-10-CM | POA: Diagnosis not present

## 2022-04-19 DIAGNOSIS — Y9301 Activity, walking, marching and hiking: Secondary | ICD-10-CM | POA: Insufficient documentation

## 2022-04-19 DIAGNOSIS — S8251XA Displaced fracture of medial malleolus of right tibia, initial encounter for closed fracture: Secondary | ICD-10-CM | POA: Diagnosis not present

## 2022-04-19 DIAGNOSIS — W172XXA Fall into hole, initial encounter: Secondary | ICD-10-CM | POA: Insufficient documentation

## 2022-04-19 DIAGNOSIS — M25571 Pain in right ankle and joints of right foot: Secondary | ICD-10-CM | POA: Diagnosis present

## 2022-04-19 MED ORDER — HYDROCODONE-ACETAMINOPHEN 5-325 MG PO TABS
1.0000 | ORAL_TABLET | Freq: Once | ORAL | Status: AC
  Administered 2022-04-19: 1 via ORAL
  Filled 2022-04-19: qty 1

## 2022-04-19 MED ORDER — OXYCODONE-ACETAMINOPHEN 5-325 MG PO TABS: 1.0000 | ORAL_TABLET | Freq: Four times a day (QID) | ORAL | 0 refills | Status: DC | PRN

## 2022-04-19 MED ORDER — IBUPROFEN 800 MG PO TABS: 800.0000 mg | ORAL_TABLET | Freq: Three times a day (TID) | ORAL | 0 refills | Status: DC

## 2022-04-19 NOTE — ED Provider Notes (Signed)
[facility] DEPT Provider Note   CSN: [id number] Arrival date & time: 04/19/22  2112     History  Chief Complaint  Patient presents with   Ankle Pain    Allison Chandler is a 31 y.o. female presenting today after a fall.  She reports she was walking through her yard and rolled both of her ankles into a hole.  Complaining of severe pain to both ankles.   Ankle Pain      Home Medications Prior to Admission medications   Medication Sig Start Date End Date Taking? Authorizing Provider  acetaminophen (TYLENOL) 500 MG tablet Take 1,000 mg by mouth every 6 (six) hours as needed for moderate pain or headache. Every 6 to 8 hours as needed for pain    [provider]  albuterol (VENTOLIN HFA) 108 (90 Base) MCG/ACT inhaler Inhale 2 puffs into the lungs every 6 (six) hours as needed for wheezing or shortness of breath.  09/02/18   [provider]  atomoxetine (STRATTERA) 80 MG capsule Take 80 mg by mouth daily. 09/11/21   [provider]  azelastine (ASTELIN) 0.1 % nasal spray Place 1-2 sprays into both nostrils 2 (two) times daily as needed (nasal drainage). Use in each nostril as directed 03/29/21   Garnet Sierras, DO  benztropine (COGENTIN) 0.5 MG tablet Take 0.5 mg by mouth 2 (two) times daily. 09/11/21   [provider]  busPIRone (BUSPAR) 30 MG tablet Take 30 mg by mouth 2 (two) times daily. 09/11/21   [provider]  DULoxetine (CYMBALTA) 60 MG capsule Take 60 mg by mouth daily. 10/15/21   [provider]  fluticasone-salmeterol (ADVAIR HFA) 115-21 MCG/ACT inhaler Inhale 2 puffs into the lungs 2 (two) times daily. 02/06/22   Garnet Sierras, DO  HYDROcodone-acetaminophen (NORCO/VICODIN) 5-325 MG tablet Take 1 tablet by mouth every 6 (six) hours as needed. 06/10/21   Caccavale, Sophia, PA-C  ketorolac (TORADOL) 10 MG tablet Take 1 tablet (10 mg total) by mouth every 8 (eight) hours as needed. 06/10/21   Caccavale, Sophia,  PA-C  levocetirizine (XYZAL) 5 MG tablet Take 1 tablet (5 mg total) by mouth every evening. 03/29/21   Garnet Sierras, DO  montelukast (SINGULAIR) 10 MG tablet Take 1 tablet by mouth daily. 05/25/19   [provider]  olopatadine (PATANOL) 0.1 % ophthalmic solution Place 1 drop into both eyes 2 (two) times daily as needed (itchy/watery eyes). 03/29/21   Garnet Sierras, DO  ondansetron (ZOFRAN-ODT) 4 MG disintegrating tablet Take 1 tablet (4 mg total) by mouth every 8 (eight) hours as needed for nausea or vomiting. 06/10/21   Caccavale, Sophia, PA-C  prazosin (MINIPRESS) 1 MG capsule Take 1 mg by mouth at bedtime. 12/31/21   [provider]  propranolol (INDERAL) 40 MG tablet Take by mouth. 12/14/21   [provider]  tamsulosin (FLOMAX) 0.4 MG CAPS capsule Take 1 capsule (0.4 mg total) by mouth daily. 06/10/21   Caccavale, Sophia, PA-C  tiZANidine (ZANAFLEX) 4 MG tablet Take 4 mg by mouth at bedtime as needed for muscle spasms.  08/26/19   [provider]  valACYclovir (VALTREX) 1000 MG tablet Take 1,000 mg by mouth 2 (two) times daily. 11/15/19   [provider]  valACYclovir (VALTREX) 500 MG tablet Take 500 mg by mouth daily. 11/16/21   [provider]  VRAYLAR 4.5 MG CAPS Take 1 capsule by mouth every evening.  09/07/19   [provider]  Allergies    Wellbutrin [bupropion]    Review of Systems   Review of Systems  Physical Exam Updated Vital Signs BP (!) 148/91 (BP Location: Left Arm)   Pulse 88   Temp 98.6 F (37 C) (Oral)   Resp 19   Ht '5\' 3"'$  (1.6 m)   Wt 112.5 kg   LMP 04/03/2022 (Exact Date)   SpO2 99%   BMI 43.93 kg/m  Physical Exam Vitals and nursing note reviewed.  Constitutional:      General: She is not in acute distress.    Appearance: Normal appearance. She is not ill-appearing.  HENT:     Head: Normocephalic and atraumatic.  Eyes:     General: No scleral icterus.    Conjunctiva/sclera: Conjunctivae normal.   Pulmonary:     Effort: Pulmonary effort is normal. No respiratory distress.  Musculoskeletal:     Comments: Full range of motion of both ankles.  5 out of 5 strength to dorsiflexion of the left ankle, 4/5 with the right.  Strong DP pulses bilaterally, sensation intact  Skin:    General: Skin is warm and dry.     Capillary Refill: Capillary refill takes less than 2 seconds.     Findings: No rash.  Neurological:     Mental Status: She is alert.  Psychiatric:        Mood and Affect: Mood normal.     ED Results / Procedures / Treatments   Labs (all labs ordered are listed, but only abnormal results are displayed) Labs Reviewed - No data to display  EKG None  Radiology DG Ankle Complete Left  Result Date: 04/19/2022 CLINICAL DATA:  Fall. EXAM: LEFT ANKLE COMPLETE - 3+ VIEW COMPARISON:  None Available. FINDINGS: There is no evidence of fracture, dislocation, or joint effusion. There is no evidence of arthropathy or other focal bone abnormality. There is soft tissue swelling surrounding the ankle. IMPRESSION: 1. No acute fracture or dislocation. 2. Soft tissue swelling surrounding the ankle. Electronically Signed   By: Ronney Asters M.D.   On: 04/19/2022 22:22   DG Ankle Complete Right  Result Date: 04/19/2022 CLINICAL DATA:  Pain, fall. EXAM: RIGHT ANKLE - COMPLETE 3+ VIEW COMPARISON:  None Available. FINDINGS: There is an acute transverse fracture through the distal fibula at and above the level of the ankle mortise with fracture fragments distracted 3 mm. There is no angulation or overlap. There is mild asymmetric widening of the medial talotibial joint space with tiny density adjacent to the tip of the medial malleolus worrisome for small avulsion fracture. There is soft tissue swelling of the ankle. IMPRESSION: 1. Acute transverse fracture through the distal fibula. 2. Mild asymmetric widening of the medial talotibial joint space. 3. Tiny avulsion fracture tip of the medial malleolus.  Electronically Signed   By: Ronney Asters M.D.   On: 04/19/2022 22:21    Procedures Procedures   Medications Ordered in ED Medications  HYDROcodone-acetaminophen (NORCO/VICODIN) 5-325 MG per tablet 1 tablet (1 tablet Oral Given 04/19/22 2210)    ED Course/ Medical Decision Making/ A&P                           Medical Decision Making Amount and/or Complexity of Data Reviewed Radiology: ordered.  Risk Prescription drug management.   31 year old female presenting today after mechanical fall.  Neurovascularly intact in bilateral ankles.   Imaging: X-ray ordered, viewed and interpreted by me.  I agree with radiologist  that she has a transverse distal right fibula fracture.  Also has small avulsion fracture of the tip of the medial malleolus.    Treatment: Given vicodin, lace up ankle brace for the left ankle.  Posterior and sugar-tong for the right.  Given crutches as well.  MDM/disposition: 31 year old female presenting after a fall in her yard.  Purely mechanical.  Neurovascularly intact.  Will require outpatient follow-up with orthopedics for her right distal fibula fracture.  She will treat with ibuprofen and the hydrocodone I sent to her pharmacy.  Return precautions discussed, otherwise will see Ortho next week.    Final Clinical Impression(s) / ED Diagnoses Final diagnoses:  Closed fracture of distal end of right fibula, unspecified fracture morphology, initial encounter    Rx / DC Orders ED Discharge Orders          Ordered    ibuprofen (ADVIL) 800 MG tablet  3 times daily        04/19/22 2306    oxyCODONE-acetaminophen (PERCOCET/ROXICET) 5-325 MG tablet  Every 6 hours PRN        04/19/22 2306           Results and diagnoses were explained to the patient. Return precautions discussed in full. Patient had no additional questions and expressed complete understanding.   This chart was dictated using voice recognition software.  Despite best efforts to proofread,   errors can occur which can change the documentation meaning.    Darliss Ridgel 04/20/22 1503    Dorie Rank, MD 04/24/22 [id number]

## 2022-04-19 NOTE — Discharge Instructions (Addendum)
You came to the emergency department after falling in her yard.  You have a right distal fibula fracture.  There is information about the your type of fracture attached to these discharge papers.    I have sent the following medications to your pharmacy Ibuprofen.  You may take this 800 mg pill 3 times a day  Oxycodone.  This is mixed with Tylenol.  Do not drive, operate machinery or drink alcohol with this medication   Please follow-up with Dr. Doreatha Martin about your right ankle fracture.  Return with any worsening symptoms.

## 2022-04-19 NOTE — ED Triage Notes (Signed)
Complaining of bilateral ankle pain due to falling in a hole in her yard

## 2022-04-20 MED ORDER — KETOROLAC TROMETHAMINE 60 MG/2ML IM SOLN
30.0000 mg | Freq: Once | INTRAMUSCULAR | Status: AC
  Administered 2022-04-20: 30 mg via INTRAMUSCULAR
  Filled 2022-04-20: qty 2

## 2022-04-20 NOTE — Progress Notes (Signed)
Orthopedic Tech Progress Note Patient Details:  Keneshia Tena Oct 27, 1990 [id number]  Ortho Devices Type of Ortho Device: ASO, Short leg splint, Stirrup splint, Crutches Ortho Device/Splint Location: RLE, LLE Ortho Device/Splint Interventions: Ordered, Application, Adjustment   Post Interventions Patient Tolerated: Well Instructions Provided: Adjustment of device, Care of device, Poper ambulation with device  Christella App L Caven Perine 04/20/2022, 1:53 AM

## 2022-04-22 ENCOUNTER — Encounter: Payer: Self-pay | Admitting: Physician Assistant

## 2022-04-22 ENCOUNTER — Ambulatory Visit (INDEPENDENT_AMBULATORY_CARE_PROVIDER_SITE_OTHER): Payer: Medicaid Other | Admitting: Physician Assistant

## 2022-04-22 ENCOUNTER — Other Ambulatory Visit: Payer: Self-pay | Admitting: Physician Assistant

## 2022-04-22 DIAGNOSIS — S82891A Other fracture of right lower leg, initial encounter for closed fracture: Secondary | ICD-10-CM | POA: Diagnosis not present

## 2022-04-22 MED ORDER — OXYCODONE-ACETAMINOPHEN 5-325 MG PO TABS: 1.0000 | ORAL_TABLET | Freq: Four times a day (QID) | ORAL | 0 refills | Status: DC | PRN

## 2022-04-22 NOTE — Progress Notes (Signed)
Office Visit Note   Patient: Allison Chandler           Date of Birth: [birth year]           MRN: [id number] Visit Date: 04/22/2022              Requested by: Willeen Niece, [address] [place],  [place] [id number] PCP: Willeen Niece, [place]  Chief Complaint  Patient presents with   Right Ankle - Fracture      HPI: Patient is a pleasant 31 year old woman with a 4-day history of right ankle pain and swelling.  She was out in the yard when she fell in a hole with her right ankle.  She also twisted her left ankle.  She had difficulty bearing weight.  She was seen in the emergency lot room at [facility] where x-rays of the right ankle demonstrated a right distal fibula fracture.  She has been in a splint and elevating it.  She also has a left ankle sprain and is in a brace.  She has never had surgery before but no family history of any problems.  She has a history of asthma, hypertension and is a smoker.  She is also prediabetic with most recent A1c being 6  Assessment & Plan: Visit Diagnoses:  1. Closed fracture of right ankle, initial encounter     Plan: Patient was seen by Dr. Sharol Given today he recommended open reduction internal fixation of the right distal fibula.  Risks of the surgery were reviewed which include but are not limited to bleeding infection anesthesia complications wound dehiscence and infection.  She understands that she is at a higher risk being a smoker for wound healing issues.  She also is at a higher risk with a higher A1c.  I did call in a refill of her pain medication.  She will elevate her ankle until Wednesday.  Remain nonweightbearing.  Also discussed with her that after surgery given her immobility she should at least be taking an aspirin a day to prevent DVT no history of DVT  Follow-Up Instructions: Return For surgery planned on 11/22.   Ortho Exam  Patient is alert, oriented, no adenopathy, well-dressed, normal affect,  normal respiratory effort. Right ankle moderate soft tissue swelling pulses are intact compartments are soft and nontender no proximal fibula pain negative squeeze test tender over the lateral malleolus mildly tender over the medial malleolus.  X-rays reviewed today did demonstrate mildly displaced Weber B distal fibula fracture.  Heart regular rate and rhythm lungs clear  Imaging: No results found. No images are attached to the encounter.  Labs: Lab Results  Component Value Date   REPTSTATUS 11/20/2019 FINAL 11/18/2019   GRAMSTAIN  04/09/2018    NO WBC SEEN NO ORGANISMS SEEN Gram Stain Report Called to,Read Back By and Verified With: [facility] @ 5597 ON [id number] BY POTEAT,S Performed at [facility] [address], [place], [place] [address]    CULT MULTIPLE SPECIES PRESENT, SUGGEST RECOLLECTION (A) 11/18/2019     Lab Results  Component Value Date   ALBUMIN 4.1 03/11/2020   ALBUMIN 4.0 11/18/2019   ALBUMIN 4.3 10/23/2019    No results found for: "MG" No results found for: "VD25OH"  No results found for: "PREALBUMIN"    Latest Ref Rng & Units 12/13/2021    9:56 AM 12/12/2021    2:35 PM 06/10/2021    6:01 AM  CBC EXTENDED  WBC 4.0 - 10.5 K/uL 9.9  10.9  18.1   RBC 3.87 - 5.11 MIL/uL 4.31  4.57  4.64   Hemoglobin 12.0 - 15.0 g/dL 12.3  13.2  13.4   HCT 36.0 - 46.0 % 37.2  39.4  41.5   Platelets 150 - 400 K/uL 284  304  287   NEUT# 1.7 - 7.7 K/uL   15.7   Lymph# 0.7 - 4.0 K/uL   1.2      There is no height or weight on file to calculate BMI.  Orders:  No orders of the defined types were placed in this encounter.  No orders of the defined types were placed in this encounter.    Procedures: No procedures performed  Clinical Data: No additional findings.  ROS:  All other systems negative, except as noted in the HPI. Review of Systems  Objective: Vital Signs: LMP 04/03/2022 (Exact Date)   Specialty Comments:  No specialty comments  available.  PMFS History: Patient Active Problem List   Diagnosis Date Noted   Closed right ankle fracture 04/22/2022   Mild intermittent asthma without complication 02/58/5277   Other allergic rhinitis 03/29/2021   Allergic conjunctivitis of both eyes 03/29/2021   Shortness of breath 03/29/2021   Other adverse food reactions, not elsewhere classified, subsequent encounter 03/29/2021   Prediabetes 10/09/2020   Environmental allergies 08/31/2020   Essential hypertension 08/31/2020   RUQ abdominal pain 09/10/2019   Rectal bleeding 09/10/2019   Amenorrhea 04/29/2018   Hypothyroidism 04/29/2018   Pain in female genitalia on intercourse 04/29/2018   Magnetic resonance imaging of brain abnormal 04/24/2018   SIRS (systemic inflammatory response syndrome) ([place]) 04/09/2018   Bipolar disorder ([place]) 04/09/2018   ADD (attention deficit disorder) 04/09/2018   Upper respiratory tract infection 08/13/2017   Severe obesity (BMI 35.0-35.9 with comorbidity) ([place]) 02/12/2016   High triglycerides 07/19/2015   Chronic back pain 04/21/2015   Chronic pelvic pain in female 04/21/2015   Nevus 04/21/2015   PCOS (polycystic ovarian syndrome) 04/21/2015   Past Medical History:  Diagnosis Date   Angio-edema    Anxiety    Asthma    Back pain, chronic    Bipolar 1 disorder (HCC)    Depression    HTN (hypertension)    PCOD (polycystic ovarian disease)     Family History  Problem Relation Age of Onset   Hypertension Mother    Ulcerative colitis Mother    Diabetes Mother    Heart disease Mother    Hyperlipidemia Mother     Past Surgical History:  Procedure Laterality Date   NO PAST SURGERIES     Social History   Occupational History   Occupation: Environmental consultant  Tobacco Use   Smoking status: Every Day    Packs/day: 1.00    Types: Cigarettes   Smokeless tobacco: Never  Vaping Use   Vaping Use: Some days   Substances: Nicotine, Flavoring  Substance and Sexual Activity   Alcohol use: Yes     Comment: occasionally   Drug use: No   Sexual activity: Yes    Birth control/protection: I.U.D.

## 2022-04-23 ENCOUNTER — Encounter (HOSPITAL_COMMUNITY): Payer: Self-pay | Admitting: Orthopedic Surgery

## 2022-04-23 ENCOUNTER — Other Ambulatory Visit: Payer: Self-pay

## 2022-04-23 NOTE — Anesthesia Preprocedure Evaluation (Addendum)
Anesthesia Evaluation  Patient identified by MRN, date of birth, ID band Patient awake    Reviewed: Allergy & Precautions, NPO status , Patient's Chart, lab work & pertinent test results  Airway Mallampati: III  TM Distance: >3 FB Neck ROM: Full    Dental  (+) Teeth Intact, Dental Advisory Given   Pulmonary shortness of breath and with exertion, asthma , sleep apnea , Current Smoker and Patient abstained from smoking.   Pulmonary exam normal breath sounds clear to auscultation       Cardiovascular hypertension, Pt. on medications Normal cardiovascular exam+ Valvular Problems/Murmurs  Rhythm:Regular Rate:Normal     Neuro/Psych  PSYCHIATRIC DISORDERS Anxiety Depression Bipolar Disorder   negative neurological ROS     GI/Hepatic negative GI ROS, Neg liver ROS,,,  Endo/Other  Hypothyroidism  Morbid obesityPre diabetes PCOS  Renal/GU Hx/o renal calculi  negative genitourinary   Musculoskeletal Fx right distal fibula   Abdominal  (+) + obese  Peds  Hematology  (+) Blood dyscrasia, anemia   Anesthesia Other Findings   Reproductive/Obstetrics                              Anesthesia Physical Anesthesia Plan  ASA: 3  Anesthesia Plan: General   Post-op Pain Management: Regional block*, Minimal or no pain anticipated and Precedex   Induction:   PONV Risk Score and Plan: 3 and Treatment may vary due to age or medical condition, Ondansetron and Dexamethasone  Airway Management Planned: LMA and Oral ETT  Additional Equipment: None  Intra-op Plan:   Post-operative Plan: Extubation in OR  Informed Consent: I have reviewed the patients History and Physical, chart, labs and discussed the procedure including the risks, benefits and alternatives for the proposed anesthesia with the patient or authorized representative who has indicated his/her understanding and acceptance.     Dental advisory  given and Interpreter used for interveiw  Plan Discussed with: CRNA and Anesthesiologist  Anesthesia Plan Comments: (PAT note written 04/23/2022 by Shonna Chock, PA-C.  )        Anesthesia Quick Evaluation

## 2022-04-23 NOTE — Progress Notes (Signed)
Anesthesia Chart Review: SAME DAY WORK-UP  Case: [id number] Date/Time: 04/24/22 1415   Procedure: OPEN REDUCTION INTERNAL FIXATION (ORIF) RIGHT ANKLE FRACTURE (Right: Ankle)   Anesthesia type: Choice   Pre-op diagnosis: Right Distal Fibula Fracture   Location: [facility] OR ROOM 07 / [facility]   Surgeons: [name], MD       DISCUSSION: Patient is a 31 year old female scheduled for the above procedure. She had a mechanical fall on 04/19/22 and sustained a right distal fibula fracture.  History includes smoking, HTN, neonatal murmur, pre-diabetes, OSA (uses CPAP), asthma, angioedema, Bipolar 1 Disoder, ADHD, PCOS, obesity.  Per allergist Dr. Scherrie Bateman documentation, allergy testing from 10/18/19 showed: "Skin testing positive to cat, dog, tree, weed pollen. Food panel positive to peanut, soy, kidney bean, thea sirensis, shellfish mix, crab, fish mix, almond, walnut, milk."  She is a same day work-up. She has had a normal EKG within the past year and had labs on 04/17/22 through her primary care. Anesthesia team to evaluate on the day of surgery. She would need a urine pregnancy test.    VS: LMP 04/03/2022 (Exact Date)  Wt Readings from Last 3 Encounters:  04/19/22 112.5 kg  12/13/21 112.5 kg  06/10/21 95.3 kg   BP Readings from Last 3 Encounters:  04/19/22 (!) 148/91  12/13/21 (!) 159/102  12/12/21 (!) 172/124   Pulse Readings from Last 3 Encounters:  04/19/22 88  12/13/21 68  12/12/21 (!) 103     PROVIDERS: [facility], [place], PA is PCP ([facility] in [place]) Rexene Alberts, DO is allergist   LABS: She had labs through [facility] (see Care Everywhere) on 04/17/22 including CBC, CMP, TSH, A1c. Results included A1c 6.0%, WBC 4.18, H/H 12.0/36.0, PLY 268, glucose 133, BUN 8, Cr 0.65, Na 139, K 4.2, AST 18, ALT 25, TSH 1.3.    IMAGES: Xray Right Ankle 04/19/22: IMPRESSION: 1. Acute transverse fracture through the distal fibula. 2. Mild asymmetric widening of the  medial talotibial joint space. 3. Tiny avulsion fracture tip of the medial malleolus.  1V CXR 12/13/21: FINDINGS: The heart size and mediastinal contours are within normal limits. Both lungs are clear. The visualized skeletal structures are unremarkable. IMPRESSION: No active disease.   EKG: 12/13/21: NSR   CV: N/A  Past Medical History:  Diagnosis Date   ADHD (attention deficit hyperactivity disorder)    ADHD   Anemia    only when pregnant   Angio-edema    Anxiety    Asthma    Back pain, chronic    Bipolar 1 disorder ([place])    COVID    mild case - Summer 2023   Depression    Heart murmur    as an infant   History of kidney stones    HTN (hypertension)    PCOD (polycystic ovarian disease)    Pre-diabetes    Sleep apnea    uses a cpap    Past Surgical History:  Procedure Laterality Date   WISDOM TOOTH EXTRACTION      MEDICATIONS: No current facility-administered medications for this encounter.    acetaminophen (TYLENOL) 500 MG tablet   albuterol (VENTOLIN HFA) 108 (90 Base) MCG/ACT inhaler   amLODipine (NORVASC) 10 MG tablet   azelastine (ASTELIN) 0.1 % nasal spray   diphenhydrAMINE (BENADRYL) 25 MG tablet   fluticasone (FLONASE) 50 MCG/ACT nasal spray   fluticasone-salmeterol (ADVAIR HFA) 115-21 MCG/ACT inhaler   ibuprofen (ADVIL) 800 MG tablet   Ketotifen Fumarate (ALLERGY EYE  DROPS OP)   Lactase (LACTAID FAST ACT) 9000 units CHEW   levocetirizine (XYZAL) 5 MG tablet   lisdexamfetamine (VYVANSE) 30 MG capsule   LORazepam (ATIVAN) 0.5 MG tablet   montelukast (SINGULAIR) 10 MG tablet   oxyCODONE-acetaminophen (PERCOCET/ROXICET) 5-325 MG tablet   prazosin (MINIPRESS) 2 MG capsule   propranolol (INDERAL) 40 MG tablet   tiZANidine (ZANAFLEX) 4 MG tablet   valACYclovir (VALTREX) 500 MG tablet   Vilazodone HCl 20 MG TABS   VRAYLAR 4.5 MG CAPS   diclofenac (VOLTAREN) 75 MG EC tablet   naproxen (NAPROSYN) 500 MG tablet    Myra Gianotti, PA-C Surgical  Short Stay/Anesthesiology [facility] Phone [phone] [facility] Phone [phone] 04/23/2022 4:03 PM

## 2022-04-23 NOTE — Progress Notes (Signed)
Spoke with pt for pre-op call. Pt is treated for HTN, is pre-diabetic, uses a cpap for sleep apnea. Pt's PCP is Willeen Niece, [place].  Shower instructions given to pt and she voiced understanding.

## 2022-04-24 ENCOUNTER — Encounter (HOSPITAL_COMMUNITY)
Admission: RE | Disposition: A | Discharge status: Home / Self Care | Payer: Self-pay | Source: Home / Self Care | Attending: Orthopedic Surgery

## 2022-04-24 ENCOUNTER — Other Ambulatory Visit: Payer: Self-pay

## 2022-04-24 ENCOUNTER — Ambulatory Visit (HOSPITAL_COMMUNITY): Payer: Medicaid Other

## 2022-04-24 ENCOUNTER — Ambulatory Visit (HOSPITAL_COMMUNITY): Payer: Medicaid Other | Admitting: Vascular Surgery

## 2022-04-24 ENCOUNTER — Ambulatory Visit (HOSPITAL_BASED_OUTPATIENT_CLINIC_OR_DEPARTMENT_OTHER): Payer: Medicaid Other | Admitting: Vascular Surgery

## 2022-04-24 ENCOUNTER — Ambulatory Visit (HOSPITAL_COMMUNITY)
Admission: RE | Admit: 2022-04-24 | Discharge: 2022-04-24 | Disposition: A | Discharge status: Home / Self Care | Payer: Medicaid Other | Attending: Orthopedic Surgery | Admitting: Orthopedic Surgery

## 2022-04-24 DIAGNOSIS — F17200 Nicotine dependence, unspecified, uncomplicated: Secondary | ICD-10-CM

## 2022-04-24 DIAGNOSIS — S82831A Other fracture of upper and lower end of right fibula, initial encounter for closed fracture: Secondary | ICD-10-CM | POA: Diagnosis present

## 2022-04-24 DIAGNOSIS — X501XXA Overexertion from prolonged static or awkward postures, initial encounter: Secondary | ICD-10-CM | POA: Diagnosis not present

## 2022-04-24 DIAGNOSIS — E039 Hypothyroidism, unspecified: Secondary | ICD-10-CM | POA: Diagnosis not present

## 2022-04-24 DIAGNOSIS — Z6841 Body Mass Index (BMI) 40.0 and over, adult: Secondary | ICD-10-CM | POA: Diagnosis not present

## 2022-04-24 DIAGNOSIS — S82891A Other fracture of right lower leg, initial encounter for closed fracture: Secondary | ICD-10-CM | POA: Diagnosis not present

## 2022-04-24 DIAGNOSIS — I10 Essential (primary) hypertension: Secondary | ICD-10-CM | POA: Insufficient documentation

## 2022-04-24 DIAGNOSIS — D638 Anemia in other chronic diseases classified elsewhere: Secondary | ICD-10-CM

## 2022-04-24 DIAGNOSIS — F17210 Nicotine dependence, cigarettes, uncomplicated: Secondary | ICD-10-CM | POA: Diagnosis not present

## 2022-04-24 HISTORY — DX: Cardiac murmur, unspecified: R01.1

## 2022-04-24 HISTORY — DX: Prediabetes: R73.03

## 2022-04-24 HISTORY — DX: Personal history of urinary calculi: Z87.442

## 2022-04-24 HISTORY — PX: ORIF ANKLE FRACTURE: SHX5408

## 2022-04-24 HISTORY — DX: Attention-deficit hyperactivity disorder, unspecified type: F90.9

## 2022-04-24 HISTORY — DX: COVID-19: U07.1

## 2022-04-24 HISTORY — DX: Anemia, unspecified: D64.9

## 2022-04-24 HISTORY — DX: Sleep apnea, unspecified: G47.30

## 2022-04-24 LAB — BASIC METABOLIC PANEL
Anion gap: 11 (ref 5–15)
BUN: 12 mg/dL (ref 6–20)
CO2: 23 mmol/L (ref 22–32)
Calcium: 9.4 mg/dL (ref 8.9–10.3)
Chloride: 104 mmol/L (ref 98–111)
Creatinine, Ser: 0.6 mg/dL (ref 0.44–1.00)
GFR, Estimated: >60 mL/min (ref >60)
Glucose, Bld: 96 mg/dL (ref 70–99)
Potassium: 4.2 mmol/L (ref 3.5–5.1)
Sodium: 138 mmol/L (ref 135–145)

## 2022-04-24 LAB — SURGICAL PCR SCREEN
MRSA, PCR: NEGATIVE
Staphylococcus aureus: NEGATIVE

## 2022-04-24 LAB — CBC
HCT: 40.4 % (ref 36.0–46.0)
Hemoglobin: 12.8 g/dL (ref 12.0–15.0)
MCH: 28.6 pg (ref 26.0–34.0)
MCHC: 31.7 g/dL (ref 30.0–36.0)
MCV: 90.4 fL (ref 80.0–100.0)
Platelets: 311 10*3/uL (ref 150–400)
RBC: 4.47 MIL/uL (ref 3.87–5.11)
RDW: 13.2 % (ref 11.5–15.5)
WBC: 11.7 10*3/uL — ABNORMAL HIGH (ref 4.0–10.5)
nRBC: 0 % (ref 0.0–0.2)

## 2022-04-24 LAB — POCT PREGNANCY, URINE: Preg Test, Ur: NEGATIVE

## 2022-04-24 SURGERY — OPEN REDUCTION INTERNAL FIXATION (ORIF) ANKLE FRACTURE
Anesthesia: General | Site: Ankle | Laterality: Right

## 2022-04-24 MED ORDER — CEFAZOLIN SODIUM-DEXTROSE 2-4 GM/100ML-% IV SOLN
2.0000 g | INTRAVENOUS | Status: AC
  Administered 2022-04-24: 2 g via INTRAVENOUS
  Filled 2022-04-24: qty 100

## 2022-04-24 MED ORDER — EPHEDRINE 5 MG/ML INJ
INTRAVENOUS | Status: AC
  Filled 2022-04-24: qty 5

## 2022-04-24 MED ORDER — ONDANSETRON HCL 4 MG/2ML IJ SOLN
INTRAMUSCULAR | Status: DC | PRN
  Administered 2022-04-24: 4 mg via INTRAVENOUS

## 2022-04-24 MED ORDER — ONDANSETRON HCL 4 MG/2ML IJ SOLN: 4.0000 mg | Freq: Once | INTRAMUSCULAR | Status: DC | PRN

## 2022-04-24 MED ORDER — MIDAZOLAM HCL 2 MG/2ML IJ SOLN: 2.0000 mg | Freq: Once | INTRAMUSCULAR | Status: AC

## 2022-04-24 MED ORDER — CEFAZOLIN SODIUM 1 G IJ SOLR
INTRAMUSCULAR | Status: AC
  Filled 2022-04-24: qty 20

## 2022-04-24 MED ORDER — HYDROMORPHONE HCL 1 MG/ML IJ SOLN: 0.2500 mg | INTRAMUSCULAR | Status: DC | PRN

## 2022-04-24 MED ORDER — PROPOFOL 10 MG/ML IV BOLUS
INTRAVENOUS | Status: AC
  Filled 2022-04-24: qty 20

## 2022-04-24 MED ORDER — OXYCODONE HCL 5 MG/5ML PO SOLN: 5.0000 mg | Freq: Once | ORAL | Status: DC | PRN

## 2022-04-24 MED ORDER — BUPIVACAINE-EPINEPHRINE (PF) 0.5% -1:200000 IJ SOLN
INTRAMUSCULAR | Status: DC | PRN
  Administered 2022-04-24: 30 mL via PERINEURAL

## 2022-04-24 MED ORDER — PROPOFOL 10 MG/ML IV BOLUS
INTRAVENOUS | Status: DC | PRN
  Administered 2022-04-24: 200 mg via INTRAVENOUS

## 2022-04-24 MED ORDER — PHENYLEPHRINE 80 MCG/ML (10ML) SYRINGE FOR IV PUSH (FOR BLOOD PRESSURE SUPPORT)
PREFILLED_SYRINGE | INTRAVENOUS | Status: AC
  Filled 2022-04-24: qty 20

## 2022-04-24 MED ORDER — FENTANYL CITRATE (PF) 100 MCG/2ML IJ SOLN: 100.0000 ug | Freq: Once | INTRAMUSCULAR | Status: AC

## 2022-04-24 MED ORDER — ORAL CARE MOUTH RINSE: 15.0000 mL | Freq: Once | OROMUCOSAL | Status: AC

## 2022-04-24 MED ORDER — ONDANSETRON HCL 4 MG/2ML IJ SOLN
INTRAMUSCULAR | Status: AC
  Filled 2022-04-24: qty 2

## 2022-04-24 MED ORDER — FENTANYL CITRATE (PF) 100 MCG/2ML IJ SOLN
INTRAMUSCULAR | Status: AC
  Administered 2022-04-24: 100 ug via INTRAVENOUS
  Filled 2022-04-24: qty 2

## 2022-04-24 MED ORDER — LACTATED RINGERS IV SOLN: INTRAVENOUS | Status: DC

## 2022-04-24 MED ORDER — PROPOFOL 1000 MG/100ML IV EMUL
INTRAVENOUS | Status: AC
  Filled 2022-04-24: qty 100

## 2022-04-24 MED ORDER — 0.9 % SODIUM CHLORIDE (POUR BTL) OPTIME
TOPICAL | Status: DC | PRN
  Administered 2022-04-24: 1000 mL

## 2022-04-24 MED ORDER — OXYCODONE HCL 5 MG PO TABS: 5.0000 mg | ORAL_TABLET | Freq: Once | ORAL | Status: DC | PRN

## 2022-04-24 MED ORDER — LIDOCAINE 2% (20 MG/ML) 5 ML SYRINGE
INTRAMUSCULAR | Status: AC
  Filled 2022-04-24: qty 25

## 2022-04-24 MED ORDER — DEXAMETHASONE SODIUM PHOSPHATE 10 MG/ML IJ SOLN
INTRAMUSCULAR | Status: AC
  Filled 2022-04-24: qty 1

## 2022-04-24 MED ORDER — EPHEDRINE 5 MG/ML INJ
INTRAVENOUS | Status: AC
  Filled 2022-04-24: qty 15

## 2022-04-24 MED ORDER — MIDAZOLAM HCL 2 MG/2ML IJ SOLN
INTRAMUSCULAR | Status: AC
  Administered 2022-04-24: 2 mg via INTRAVENOUS
  Filled 2022-04-24: qty 2

## 2022-04-24 MED ORDER — LIDOCAINE 2% (20 MG/ML) 5 ML SYRINGE
INTRAMUSCULAR | Status: DC | PRN
  Administered 2022-04-24: 100 mg via INTRAVENOUS

## 2022-04-24 MED ORDER — DEXAMETHASONE SODIUM PHOSPHATE 10 MG/ML IJ SOLN
INTRAMUSCULAR | Status: DC | PRN
  Administered 2022-04-24: 10 mg via INTRAVENOUS

## 2022-04-24 MED ORDER — EPHEDRINE SULFATE-NACL 50-0.9 MG/10ML-% IV SOSY
PREFILLED_SYRINGE | INTRAVENOUS | Status: DC | PRN
  Administered 2022-04-24 (×4): 5 mg via INTRAVENOUS

## 2022-04-24 MED ORDER — ROCURONIUM BROMIDE 10 MG/ML (PF) SYRINGE
PREFILLED_SYRINGE | INTRAVENOUS | Status: AC
  Filled 2022-04-24: qty 10

## 2022-04-24 MED ORDER — DEXMEDETOMIDINE HCL IN NACL 200 MCG/50ML IV SOLN
INTRAVENOUS | Status: DC | PRN
  Administered 2022-04-24: 8 ug via INTRAVENOUS

## 2022-04-24 MED ORDER — DEXMEDETOMIDINE HCL IN NACL 80 MCG/20ML IV SOLN
INTRAVENOUS | Status: AC
  Filled 2022-04-24: qty 20

## 2022-04-24 MED ORDER — CHLORHEXIDINE GLUCONATE 0.12 % MT SOLN
15.0000 mL | Freq: Once | OROMUCOSAL | Status: AC
  Administered 2022-04-24: 15 mL via OROMUCOSAL
  Filled 2022-04-24: qty 15

## 2022-04-24 SURGICAL SUPPLY — 46 items
BAG COUNTER SPONGE SURGICOUNT (BAG) ×1 IMPLANT
BANDAGE ESMARK 6X9 LF (GAUZE/BANDAGES/DRESSINGS) IMPLANT
BIT DRILL 2.5X2.75 QC CALB (BIT) IMPLANT
BIT DRILL 3.5X5.5 QC CALB (BIT) IMPLANT
BNDG COHESIVE 4X5 TAN STRL (GAUZE/BANDAGES/DRESSINGS) ×1 IMPLANT
BNDG COHESIVE 4X5 TAN STRL LF (GAUZE/BANDAGES/DRESSINGS) IMPLANT
BNDG ESMARK 6X9 LF (GAUZE/BANDAGES/DRESSINGS)
BNDG GAUZE DERMACEA FLUFF 4 (GAUZE/BANDAGES/DRESSINGS) ×1 IMPLANT
COVER SURGICAL LIGHT HANDLE (MISCELLANEOUS) ×1 IMPLANT
DRAPE OEC MINIVIEW 54X84 (DRAPES) IMPLANT
DRAPE U-SHAPE 47X51 STRL (DRAPES) ×1 IMPLANT
DRSG ADAPTIC 3X8 NADH LF (GAUZE/BANDAGES/DRESSINGS) ×1 IMPLANT
DRSG CURAD 3X16 NADH (PACKING) IMPLANT
DURAPREP 26ML APPLICATOR (WOUND CARE) ×1 IMPLANT
ELECT REM PT RETURN 9FT ADLT (ELECTROSURGICAL) ×1
ELECTRODE REM PT RTRN 9FT ADLT (ELECTROSURGICAL) ×1 IMPLANT
GAUZE PAD ABD 8X10 STRL (GAUZE/BANDAGES/DRESSINGS) ×1 IMPLANT
GAUZE SPONGE 4X4 12PLY STRL (GAUZE/BANDAGES/DRESSINGS) ×1 IMPLANT
GLOVE BIOGEL PI IND STRL 9 (GLOVE) ×1 IMPLANT
GLOVE SURG ORTHO 9.0 STRL STRW (GLOVE) ×1 IMPLANT
GOWN STRL REUS W/ TWL XL LVL3 (GOWN DISPOSABLE) ×3 IMPLANT
GOWN STRL REUS W/TWL XL LVL3 (GOWN DISPOSABLE) ×3
KIT BASIN OR (CUSTOM PROCEDURE TRAY) ×1 IMPLANT
KIT TURNOVER KIT B (KITS) ×1 IMPLANT
MANIFOLD NEPTUNE II (INSTRUMENTS) ×1 IMPLANT
NS IRRIG 1000ML POUR BTL (IV SOLUTION) ×1 IMPLANT
PACK ORTHO EXTREMITY (CUSTOM PROCEDURE TRAY) ×1 IMPLANT
PAD ARMBOARD 7.5X6 YLW CONV (MISCELLANEOUS) ×2 IMPLANT
PLATE ACE 100DEG 7HOLE (Plate) IMPLANT
SCREW CORTICAL 3.5MM  12MM (Screw) ×2 IMPLANT
SCREW CORTICAL 3.5MM  16MM (Screw) ×1 IMPLANT
SCREW CORTICAL 3.5MM  20MM (Screw) ×1 IMPLANT
SCREW CORTICAL 3.5MM 12MM (Screw) IMPLANT
SCREW CORTICAL 3.5MM 14MM (Screw) IMPLANT
SCREW CORTICAL 3.5MM 16MM (Screw) IMPLANT
SCREW CORTICAL 3.5MM 18MM (Screw) IMPLANT
SCREW CORTICAL 3.5MM 20MM (Screw) IMPLANT
STAPLER VISISTAT 35W (STAPLE) IMPLANT
SUCTION FRAZIER HANDLE 10FR (MISCELLANEOUS) ×1
SUCTION TUBE FRAZIER 10FR DISP (MISCELLANEOUS) ×1 IMPLANT
SUT ETHILON 2 0 PSLX (SUTURE) IMPLANT
SUT VIC AB 2-0 CT1 27 (SUTURE) ×1
SUT VIC AB 2-0 CT1 TAPERPNT 27 (SUTURE) ×1 IMPLANT
TOWEL GREEN STERILE (TOWEL DISPOSABLE) ×1 IMPLANT
TOWEL GREEN STERILE FF (TOWEL DISPOSABLE) ×1 IMPLANT
TUBE CONNECTING 12X1/4 (SUCTIONS) ×1 IMPLANT

## 2022-04-24 NOTE — Op Note (Signed)
04/24/2022  5:54 PM  PATIENT:  Allison Chandler    PRE-OPERATIVE DIAGNOSIS:  Right Distal Fibula Fracture  POST-OPERATIVE DIAGNOSIS:  Same  PROCEDURE:  OPEN REDUCTION INTERNAL FIXATION (ORIF) RIGHT ANKLE FRACTURE C-arm fluoroscopy to verify reduction.  SURGEON:  [name], MD  PHYSICIAN ASSISTANT:None ANESTHESIA:   General  PREOPERATIVE INDICATIONS:  Allison Chandler is a  31 y.o. female with a diagnosis of Right Distal Fibula Fracture who failed conservative measures and elected for surgical management.    The risks benefits and alternatives were discussed with the patient preoperatively including but not limited to the risks of infection, bleeding, nerve injury, cardiopulmonary complications, the need for revision surgery, among others, and the patient was willing to proceed.  OPERATIVE IMPLANTS: 7 hole one third titanium tubular plate.  '@ENCIMAGES'$ @  OPERATIVE FINDINGS: C-arm fluoroscopy verified reduction of the lateral malleolus and a congruent mortise.  OPERATIVE PROCEDURE: Patient was brought to the operating room after regional block underwent a general anesthetic.  After adequate levels anesthesia were obtained patient's right lower extremity was prepped using DuraPrep draped into a sterile field a timeout was called.  A lateral incision was made over the fibula this was carried sharply down to bone.  Subperiosteal dissection was used to cleanse the fracture.  The fracture was opened and further debrided.  The distal fibula was pulled out to length the fracture was reduced and stabilized with an interfrag screw 20 mm in length.  A antiglide posterior lateral plate was applied secured proximally with 3 compression screws and distally with 1 compression screw.  C arm fluoroscopy verified reduction of the mortise and the fibula was out to length.  The wound was irrigated with normal saline the incision closed with 2-0 nylon.  Sterile dressing was applied patient was taken the PACU in  stable condition.   DISCHARGE PLANNING:  Antibiotic duration: Preoperative antibiotics  Weightbearing: Nonweightbearing on the right  Pain medication: Patient has a prescription for Percocet  Dressing care/ Wound VAC: Dry dressing  Ambulatory devices: Order written for a wheelchair  Discharge to: Home.  Follow-up: In the office 1 week post operative.

## 2022-04-24 NOTE — Interval H&P Note (Signed)
History and Physical Interval Note:  04/24/2022 1:29 PM  Allison Chandler  has presented today for surgery, with the diagnosis of Right Distal Fibula Fracture.  The various methods of treatment have been discussed with the patient and family. After consideration of risks, benefits and other options for treatment, the patient has consented to  Procedure(s): OPEN REDUCTION INTERNAL FIXATION (ORIF) RIGHT ANKLE FRACTURE (Right) as a surgical intervention.  The patient's history has been reviewed, patient examined, no change in status, stable for surgery.  I have reviewed the patient's chart and labs.  Questions were answered to the patient's satisfaction.     [name]

## 2022-04-24 NOTE — Transfer of Care (Signed)
Immediate Anesthesia Transfer of Care Note  Patient: Allison Chandler  Procedure(s) Performed: OPEN REDUCTION INTERNAL FIXATION (ORIF) RIGHT ANKLE FRACTURE (Right: Ankle)  Patient Location: PACU  Anesthesia Type:General  Level of Consciousness: awake, alert , and oriented  Airway & Oxygen Therapy: Patient Spontanous Breathing  Post-op Assessment: Report given to RN and Post -op Vital signs reviewed and stable  Post vital signs: Reviewed and stable  Last Vitals:  Vitals Value Taken Time  BP 141/82 04/24/22 1630  Temp    Pulse 81 04/24/22 1632  Resp 17 04/24/22 1632  SpO2 97 % 04/24/22 1632  Vitals shown include unvalidated device data.  Last Pain:  Vitals:   04/24/22 1455  PainSc: 0-No pain      Patients Stated Pain Goal: 0 (91/69/45 0388)  Complications: No notable events documented.

## 2022-04-24 NOTE — Anesthesia Postprocedure Evaluation (Signed)
Anesthesia Post Note  Patient: Allison Chandler  Procedure(s) Performed: OPEN REDUCTION INTERNAL FIXATION (ORIF) RIGHT ANKLE FRACTURE (Right: Ankle)     Patient location during evaluation: PACU Anesthesia Type: General Level of consciousness: awake and alert and oriented Pain management: pain level controlled Vital Signs Assessment: post-procedure vital signs reviewed and stable Respiratory status: spontaneous breathing, nonlabored ventilation and respiratory function stable Cardiovascular status: blood pressure returned to baseline and stable Postop Assessment: no apparent nausea or vomiting Anesthetic complications: no   No notable events documented.  Last Vitals:  Vitals:   04/24/22 1645 04/24/22 1700  BP: 138/77 (!) 141/78  Pulse: 76 68  Resp: 15 14  Temp:  (!) 36.2 C  SpO2: 94% 99%    Last Pain:  Vitals:   04/24/22 1700  PainSc: 0-No pain                 Genieve Ramaswamy A.

## 2022-04-24 NOTE — H&P (Signed)
Allison Chandler is an 31 y.o. female.   Chief Complaint: Right ankle fracture. HPI: Patient is a 31 year old woman who sustained a supination external rotation injury sustaining a Weber B right ankle fibular fracture.  The wound is closed.  Past Medical History:  Diagnosis Date   ADHD (attention deficit hyperactivity disorder)    ADHD   Anemia    only when pregnant   Angio-edema    Anxiety    Asthma    Back pain, chronic    Bipolar 1 disorder ([place])    COVID    mild case - Summer 2023   Depression    Heart murmur    as an infant   History of kidney stones    HTN (hypertension)    PCOD (polycystic ovarian disease)    Pre-diabetes    Sleep apnea    uses a cpap    Past Surgical History:  Procedure Laterality Date   WISDOM TOOTH EXTRACTION      Family History  Problem Relation Age of Onset   Hypertension Mother    Ulcerative colitis Mother    Diabetes Mother    Heart disease Mother    Hyperlipidemia Mother    Social History:  reports that she has been smoking cigarettes. She has been smoking an average of 1 pack per day. She has never used smokeless tobacco. She reports current alcohol use. She reports that she does not use drugs.  Allergies:  Allergies  Allergen Reactions   Other     Dairy - GI issues    Wellbutrin [Bupropion] Rash    No medications prior to admission.    No results found for this or any previous visit (from the past 48 hour(s)). No results found.  Review of Systems  All other systems reviewed and are negative.   Last menstrual period 04/03/2022. Physical Exam  Examination patient is alert oriented no adenopathy well-dressed normal affect normal respiratory effort.  She has a good dorsalis pedis pulse.  Radiograph shows a displaced Weber B fibular fracture with a noncongruent mortise. Assessment/Plan Assessment: Displaced Weber B right ankle fibular fracture.  Plan: Will plan for open reduction internal fixation.  Risk and benefits were  discussed including infection neurovascular injury arthritis need for additional surgery.  Patient states she understands wished to proceed at this time.  [name], MD 04/24/2022, 6:59 AM

## 2022-04-24 NOTE — Anesthesia Procedure Notes (Signed)
Anesthesia Regional Block: Popliteal block   Pre-Anesthetic Checklist: , timeout performed,  Correct Patient, Correct Site, Correct Laterality,  Correct Procedure, Correct Position, site marked,  Risks and benefits discussed,  Surgical consent,  Pre-op evaluation,  At surgeon's request and post-op pain management  Laterality: Right  Prep: chloraprep       Needles:  Injection technique: Single-shot  Needle Type: Echogenic Stimulator Needle     Needle Length: 10cm  Needle Gauge: 21   Needle insertion depth: 7 cm   Additional Needles:   Procedures:,,,, ultrasound used (permanent image in chart),,    Narrative:  Start time: 04/24/2022 2:41 PM End time: 04/24/2022 2:46 PM Injection made incrementally with aspirations every 5 mL.  Performed by: Personally  Anesthesiologist: Josephine Igo, MD  Additional Notes: Timeout performed. Patient sedated. Relevant anatomy ID'd using [place]. Incremental 2-14m injection of LA with frequent aspiration. Patient tolerated procedure well.     Right Popliteal Block

## 2022-04-24 NOTE — Anesthesia Procedure Notes (Signed)
Procedure Name: LMA Insertion Date/Time: 04/24/2022 3:41 PM  Performed by: Griffin Dakin, CRNAPre-anesthesia Checklist: Patient identified, Emergency Drugs available, Suction available, Patient being monitored and Timeout performed Patient Re-evaluated:Patient Re-evaluated prior to induction Oxygen Delivery Method: Circle system utilized Preoxygenation: Pre-oxygenation with 100% oxygen Induction Type: IV induction Ventilation: Mask ventilation without difficulty LMA: LMA inserted LMA Size: 4.0 Number of attempts: 1 Placement Confirmation: positive ETCO2 and breath sounds checked- equal and bilateral Tube secured with: Tape Dental Injury: Teeth and Oropharynx as per pre-operative assessment

## 2022-04-25 ENCOUNTER — Encounter (HOSPITAL_COMMUNITY): Payer: Self-pay | Admitting: Orthopedic Surgery

## 2022-04-29 ENCOUNTER — Telehealth: Payer: Self-pay | Admitting: Orthopedic Surgery

## 2022-04-29 ENCOUNTER — Other Ambulatory Visit: Payer: Self-pay | Admitting: Orthopedic Surgery

## 2022-04-29 MED ORDER — OXYCODONE-ACETAMINOPHEN 5-325 MG PO TABS: 1.0000 | ORAL_TABLET | Freq: Four times a day (QID) | ORAL | 0 refills | Status: DC | PRN

## 2022-04-29 NOTE — Telephone Encounter (Signed)
Pt called and informed of pain med sent in.

## 2022-04-29 NOTE — Telephone Encounter (Signed)
S/p ORIF right ankle fracture 04/24/22 Oxycodone last filled at [facility] 04/22/22 #30, please advise on refill.

## 2022-04-29 NOTE — Telephone Encounter (Signed)
Pt requesting pain medication refill prior to surgery follow up on Wednesday..Please call and confirm..[phone]

## 2022-05-01 ENCOUNTER — Encounter: Payer: Medicaid Other | Admitting: Family

## 2022-05-01 ENCOUNTER — Encounter (HOSPITAL_COMMUNITY): Payer: Self-pay | Admitting: Orthopedic Surgery

## 2022-05-07 ENCOUNTER — Telehealth: Payer: Self-pay | Admitting: Family

## 2022-05-07 ENCOUNTER — Ambulatory Visit (INDEPENDENT_AMBULATORY_CARE_PROVIDER_SITE_OTHER): Payer: Medicaid Other | Admitting: Family

## 2022-05-07 ENCOUNTER — Other Ambulatory Visit: Payer: Self-pay | Admitting: Orthopedic Surgery

## 2022-05-07 ENCOUNTER — Ambulatory Visit (INDEPENDENT_AMBULATORY_CARE_PROVIDER_SITE_OTHER): Payer: Medicaid Other

## 2022-05-07 ENCOUNTER — Encounter: Payer: Self-pay | Admitting: Family

## 2022-05-07 DIAGNOSIS — Z9889 Other specified postprocedural states: Secondary | ICD-10-CM

## 2022-05-07 DIAGNOSIS — Z8781 Personal history of (healed) traumatic fracture: Secondary | ICD-10-CM

## 2022-05-07 MED ORDER — IBUPROFEN 800 MG PO TABS: 800.0000 mg | ORAL_TABLET | Freq: Three times a day (TID) | ORAL | 3 refills | Status: DC

## 2022-05-07 MED ORDER — OXYCODONE-ACETAMINOPHEN 5-325 MG PO TABS: 1.0000 | ORAL_TABLET | Freq: Four times a day (QID) | ORAL | 0 refills | Status: DC | PRN

## 2022-05-07 NOTE — Progress Notes (Signed)
Post-Op Visit Note   Patient: Allison Chandler           Date of Birth: Sep 16, 1990           MRN: [id number] Visit Date: 05/07/2022 PCP: Willeen Niece, PA  Chief Complaint:  Chief Complaint  Patient presents with   Right Ankle - Routine Post Op    04/24/22 ORIF right ankle fracture    HPI:  HPI The patient is a 31 year old woman seen status post ORIF right ankle fracture she has been in a cam walker nonweightbearing Ortho Exam On examination of the right ankle her incision is healing well laterally sutures are in place there is no erythema warmth or drainage  Visit Diagnoses:  1. S/P ORIF (open reduction internal fixation) fracture     Plan: Continue nonweightbearing continue cam sutures harvested today.  Dry dressings elevate for swelling  Follow-Up Instructions: No follow-ups on file.   Imaging: No results found.  Orders:  Orders Placed This Encounter  Procedures   XR Ankle Complete Right   No orders of the defined types were placed in this encounter.    PMFS History: Patient Active Problem List   Diagnosis Date Noted   Closed right ankle fracture 04/22/2022   Mild intermittent asthma without complication 99/83/3825   Other allergic rhinitis 03/29/2021   Allergic conjunctivitis of both eyes 03/29/2021   Shortness of breath 03/29/2021   Other adverse food reactions, not elsewhere classified, subsequent encounter 03/29/2021   Prediabetes 10/09/2020   Environmental allergies 08/31/2020   Essential hypertension 08/31/2020   RUQ abdominal pain 09/10/2019   Rectal bleeding 09/10/2019   Amenorrhea 04/29/2018   Hypothyroidism 04/29/2018   Pain in female genitalia on intercourse 04/29/2018   Magnetic resonance imaging of brain abnormal 04/24/2018   SIRS (systemic inflammatory response syndrome) ([place]) 04/09/2018   Bipolar disorder ([place]) 04/09/2018   ADD (attention deficit disorder) 04/09/2018   Upper respiratory tract infection 08/13/2017   Severe obesity  (BMI 35.0-35.9 with comorbidity) ([place]) 02/12/2016   High triglycerides 07/19/2015   Chronic back pain 04/21/2015   Chronic pelvic pain in female 04/21/2015   Nevus 04/21/2015   PCOS (polycystic ovarian syndrome) 04/21/2015   Past Medical History:  Diagnosis Date   ADHD (attention deficit hyperactivity disorder)    ADHD   Anemia    only when pregnant   Angio-edema    Anxiety    Asthma    Back pain, chronic    Bipolar 1 disorder ([place])    COVID    mild case - Summer 2023   Depression    Heart murmur    as an infant   History of kidney stones    HTN (hypertension)    PCOD (polycystic ovarian disease)    Pre-diabetes    Sleep apnea    uses a cpap    Family History  Problem Relation Age of Onset   Hypertension Mother    Ulcerative colitis Mother    Diabetes Mother    Heart disease Mother    Hyperlipidemia Mother     Past Surgical History:  Procedure Laterality Date   ORIF ANKLE FRACTURE Right 04/24/2022   Procedure: OPEN REDUCTION INTERNAL FIXATION (ORIF) RIGHT ANKLE FRACTURE;  Surgeon: [name], MD;  Location: [place];  Service: Orthopedics;  Laterality: Right;   WISDOM TOOTH EXTRACTION     Social History   Occupational History   Occupation: Environmental consultant  Tobacco Use   Smoking status: Every Day    Packs/day: 1.00  Types: Cigarettes   Smokeless tobacco: Never  Vaping Use   Vaping Use: Every day   Substances: Nicotine, Flavoring  Substance and Sexual Activity   Alcohol use: Yes    Comment: occasionally   Drug use: No   Sexual activity: Yes    Birth control/protection: I.U.D.

## 2022-05-07 NOTE — Telephone Encounter (Signed)
Patient states she need her Oxycodone and Ibuprofen ASAP she states she is going to run out today. Please Advise..[phone]

## 2022-05-07 NOTE — Telephone Encounter (Signed)
Pt called and had an appt today. Pt asked for PA Erin to please send in pain medication if she already haven't. Please send to pharmacy on file. Pt phone number is [phone].

## 2022-05-07 NOTE — Telephone Encounter (Signed)
04/24/22 ORIF right ankle fracture Oxycodone last filled 04/29/22

## 2022-05-07 NOTE — Telephone Encounter (Signed)
This is pending, I've already sent Dr. Sharol Given a request for refill, please see below, thanks.

## 2022-05-08 ENCOUNTER — Telehealth: Payer: Self-pay | Admitting: Orthopedic Surgery

## 2022-05-08 ENCOUNTER — Telehealth: Payer: Self-pay

## 2022-05-08 NOTE — Telephone Encounter (Signed)
Priro auth approved start date 05/08/2022 ending 11/04/2022 PA case #[id number] called pt to advise.

## 2022-05-08 NOTE — Telephone Encounter (Signed)
04/24/2022 ORIF right ankle fx. Prior auth request receoived from cover my meds for Oxycodone 5/325 #30 sent to [facility] [place] will hold pending approval.

## 2022-05-08 NOTE — Telephone Encounter (Signed)
Pt called sating she went to pick up her refills from pharmacy and they wont give her the medication. Pharmacy states they need a pre auth. Please send pre auth to pharmacy so she can pick up her meds. Please call pt when sent in. Pt phone number is [phone].

## 2022-05-08 NOTE — Telephone Encounter (Signed)
Received fax from [place] this morning with cover my meds key. Entered prior auth pending approval.

## 2022-05-13 ENCOUNTER — Other Ambulatory Visit: Payer: Self-pay | Admitting: Orthopedic Surgery

## 2022-05-22 ENCOUNTER — Encounter: Payer: Self-pay | Admitting: Family

## 2022-05-22 ENCOUNTER — Ambulatory Visit (INDEPENDENT_AMBULATORY_CARE_PROVIDER_SITE_OTHER): Payer: Medicaid Other

## 2022-05-22 ENCOUNTER — Ambulatory Visit (INDEPENDENT_AMBULATORY_CARE_PROVIDER_SITE_OTHER): Payer: Medicaid Other | Admitting: Family

## 2022-05-22 DIAGNOSIS — Z9889 Other specified postprocedural states: Secondary | ICD-10-CM

## 2022-05-22 DIAGNOSIS — Z8781 Personal history of (healed) traumatic fracture: Secondary | ICD-10-CM | POA: Diagnosis not present

## 2022-05-22 NOTE — Progress Notes (Signed)
Post-Op Visit Note   Patient: Allison Chandler           Date of Birth: March 30, 1991           MRN: [id number] Visit Date: 05/22/2022 PCP: Willeen Niece, PA  Chief Complaint:  Chief Complaint  Patient presents with   Right Ankle - Routine Post Op    04/24/22 ORIF right ankle fracture    HPI:  HPI The patient is a 31 year old woman seen status post ORIF right ankle fracture on November 22 she has been in a cam walker nonweightbearing Ortho Exam On examination of the right ankle her lateral incision is well-healed there is minimal edema no erythema no opening no drainage  Visit Diagnoses:  1. S/P ORIF (open reduction internal fixation) fracture     Plan: Radiographs stable.  May begin weightbearing in her cam walker as tolerated plan to advance to regular shoewear at next visit  Follow-Up Instructions: No follow-ups on file.   Imaging: No results found.  Orders:  Orders Placed This Encounter  Procedures   XR Ankle Complete Right   No orders of the defined types were placed in this encounter.    PMFS History: Patient Active Problem List   Diagnosis Date Noted   Closed right ankle fracture 04/22/2022   Mild intermittent asthma without complication 15/17/6160   Other allergic rhinitis 03/29/2021   Allergic conjunctivitis of both eyes 03/29/2021   Shortness of breath 03/29/2021   Other adverse food reactions, not elsewhere classified, subsequent encounter 03/29/2021   Prediabetes 10/09/2020   Environmental allergies 08/31/2020   Essential hypertension 08/31/2020   RUQ abdominal pain 09/10/2019   Rectal bleeding 09/10/2019   Amenorrhea 04/29/2018   Hypothyroidism 04/29/2018   Pain in female genitalia on intercourse 04/29/2018   Magnetic resonance imaging of brain abnormal 04/24/2018   SIRS (systemic inflammatory response syndrome) ([place]) 04/09/2018   Bipolar disorder ([place]) 04/09/2018   ADD (attention deficit disorder) 04/09/2018   Upper respiratory tract  infection 08/13/2017   Severe obesity (BMI 35.0-35.9 with comorbidity) ([place]) 02/12/2016   High triglycerides 07/19/2015   Chronic back pain 04/21/2015   Chronic pelvic pain in female 04/21/2015   Nevus 04/21/2015   PCOS (polycystic ovarian syndrome) 04/21/2015   Past Medical History:  Diagnosis Date   ADHD (attention deficit hyperactivity disorder)    ADHD   Anemia    only when pregnant   Angio-edema    Anxiety    Asthma    Back pain, chronic    Bipolar 1 disorder ([place])    COVID    mild case - Summer 2023   Depression    Heart murmur    as an infant   History of kidney stones    HTN (hypertension)    PCOD (polycystic ovarian disease)    Pre-diabetes    Sleep apnea    uses a cpap    Family History  Problem Relation Age of Onset   Hypertension Mother    Ulcerative colitis Mother    Diabetes Mother    Heart disease Mother    Hyperlipidemia Mother     Past Surgical History:  Procedure Laterality Date   ORIF ANKLE FRACTURE Right 04/24/2022   Procedure: OPEN REDUCTION INTERNAL FIXATION (ORIF) RIGHT ANKLE FRACTURE;  Surgeon: [name], MD;  Location: [place];  Service: Orthopedics;  Laterality: Right;   WISDOM TOOTH EXTRACTION     Social History   Occupational History   Occupation: Environmental consultant  Tobacco Use  Smoking status: Every Day    Packs/day: 1.00    Types: Cigarettes   Smokeless tobacco: Never  Vaping Use   Vaping Use: Every day   Substances: Nicotine, Flavoring  Substance and Sexual Activity   Alcohol use: Yes    Comment: occasionally   Drug use: No   Sexual activity: Yes    Birth control/protection: I.U.D.

## 2022-05-31 NOTE — Progress Notes (Signed)
06/04/22- 31 yoF Smoker (1 ppd) for sleep evaluation courtesy of Misty Stanley, PA-C, with concern of OSA on CPAP Medical problem list includes- HTN, Asthma, Allergic Rhinitis, Hypothyroid, PCOS, Obesity, Depression/ BiPolar,   NPSG [facility] Kathryne Sharper [phone]/21/19- results pending to media tab CPAP auto 7-14/ [employer] Epworth score- Body weight today-234 lbs Covid vax- Flu vax- - Vyvanse,  Has used CPAP routinely. Needs to establish for supplies. Sleeps much better with CPAP- no more daytime tiredness and fiance reports she is not snoring. No ENT surgery. Asthma managed by Allergist. Born with mild heart murmur. Denies pregnant. Current machine is original, new in 2021. CXR 12/13/21- IMPRESSION: No active disease.  Prior to Admission medications   Medication Sig Start Date End Date Taking? Authorizing Provider  acetaminophen (TYLENOL) 500 MG tablet Take 1,000 mg by mouth every 6 (six) hours as needed for moderate pain or headache. Every 6 to 8 hours as needed for pain   Yes [provider]  albuterol (VENTOLIN HFA) 108 (90 Base) MCG/ACT inhaler Inhale 2 puffs into the lungs every 6 (six) hours as needed for wheezing or shortness of breath.  09/02/18  Yes [provider]  amLODipine (NORVASC) 10 MG tablet Take 10 mg by mouth daily.   Yes [provider]  azelastine (ASTELIN) 0.1 % nasal spray Place 1-2 sprays into both nostrils 2 (two) times daily as needed (nasal drainage). Use in each nostril as directed Patient taking differently: Place 1 spray into both nostrils daily. Use in each nostril as directed 03/29/21  Yes Ellamae Sia, DO  diclofenac (VOLTAREN) 75 MG EC tablet Take 75 mg by mouth 2 (two) times daily.   Yes [provider]  diphenhydrAMINE (BENADRYL) 25 MG tablet Take 25 mg by mouth every 6 (six) hours as needed for allergies.   Yes [provider]  fluticasone (FLONASE) 50 MCG/ACT nasal spray Place 1 spray into  both nostrils 2 (two) times daily.   Yes [provider]  fluticasone-salmeterol (ADVAIR HFA) 115-21 MCG/ACT inhaler Inhale 2 puffs into the lungs 2 (two) times daily. 02/06/22  Yes Ellamae Sia, DO  ibuprofen (ADVIL) 800 MG tablet Take 1 tablet (800 mg total) by mouth 3 (three) times daily. 05/07/22  Yes Nadara Mustard, MD  Ketotifen Fumarate (ALLERGY EYE DROPS OP) Place 1 drop into both eyes daily as needed (allergies).   Yes [provider]  Lactase (LACTAID FAST ACT) 9000 units CHEW Chew 9,000 Units by mouth as needed (consuming dairy).   Yes [provider]  levocetirizine (XYZAL) 5 MG tablet Take 1 tablet (5 mg total) by mouth every evening. 03/29/21  Yes Ellamae Sia, DO  lisdexamfetamine (VYVANSE) 30 MG capsule Take 30 mg by mouth daily.   Yes [provider]  LORazepam (ATIVAN) 0.5 MG tablet Take 0.5 mg by mouth daily as needed for anxiety.   Yes [provider]  montelukast (SINGULAIR) 10 MG tablet Take 10 mg by mouth daily. 05/25/19  Yes [provider]  naproxen (NAPROSYN) 500 MG tablet Take 500 mg by mouth 2 (two) times daily.   Yes [provider]  prazosin (MINIPRESS) 2 MG capsule Take 2 mg by mouth daily.   Yes [provider]  propranolol (INDERAL) 40 MG tablet Take 40 mg by mouth 2 (two) times daily. 12/14/21  Yes [provider]  tiZANidine (ZANAFLEX) 4 MG tablet Take 6 mg by mouth every 8 (eight) hours as needed for muscle spasms. 08/26/19  Yes [provider]  valACYclovir (VALTREX) 500 MG tablet Take 500 mg by mouth daily. 11/16/21  Yes [provider]  Vilazodone HCl 20 MG TABS Take 20 mg by mouth daily.   Yes [provider]  VRAYLAR 4.5 MG CAPS Take 1 capsule by mouth every evening.  09/07/19  Yes [provider]  oxyCODONE-acetaminophen (PERCOCET/ROXICET) 5-325 MG tablet Take 1 tablet by mouth every 6 (six) hours as needed for severe pain. 05/07/22   Nadara Mustard, MD    Past Medical History:  Diagnosis Date   ADHD (attention deficit hyperactivity disorder)    ADHD   Anemia    only when pregnant   Angio-edema    Anxiety    Asthma    Back pain, chronic    Bipolar 1 disorder ([facility])    COVID    mild case - Summer 2023   Depression    Heart murmur    as an infant   History of kidney stones    HTN (hypertension)    PCOD (polycystic ovarian disease)    Pre-diabetes    Sleep apnea    uses a cpap   Past Surgical History:  Procedure Laterality Date   ORIF ANKLE FRACTURE Right 04/24/2022   Procedure: OPEN REDUCTION INTERNAL FIXATION (ORIF) RIGHT ANKLE FRACTURE;  Surgeon: Nadara Mustard, MD;  Location[facility];  Service: Orthopedics;  Laterality: Right;   WISDOM TOOTH EXTRACTION     Family History  Problem Relation Age of Onset   Hypertension Mother    Ulcerative colitis Mother    Diabetes Mother    Heart disease Mother    Hyperlipidemia Mother    Social History   Socioeconomic History   Marital status: Single    Spouse name: Not on file   Number of children: 1   Years of education: Not on file   Highest education level: Not on file  Occupational History   Occupation: Neurosurgeon  Tobacco Use   Smoking status: Every Day    Packs/day: 1.00    Types: Cigarettes   Smokeless tobacco: Never  Vaping Use   Vaping Use: Every day   Substances: Nicotine, Flavoring  Substance and Sexual Activity   Alcohol use: Yes    Comment: occasionally   Drug use: No   Sexual activity: Yes    Birth control/protection: I.U.D.  Other Topics Concern   Not on file  Social History Narrative   Not on file   Social Determinants of Health   Financial Resource Strain: Not on file  Food Insecurity: Not on file  Transportation Needs: Not on file  Physical Activity: Not on file  Stress: Not on file  Social Connections: Not on file  Intimate Partner Violence: Not on file   ROS-see HPI   + = positive Constitutional:    weight loss, night sweats, fevers,  chills, fatigue, lassitude. HEENT:    headaches, difficulty swallowing, tooth/dental problems, sore throat,       sneezing, itching, ear ache, nasal congestion, post nasal drip, snoring CV:    chest pain, orthopnea, PND, swelling in lower extremities, anasarca,                                   dizziness, palpitations Resp:   shortness of breath with exertion or at rest.                productive cough,  non-productive cough, coughing up of blood.              change in color of mucus.  wheezing.   Skin:    rash or lesions. GI:  No-   heartburn, indigestion, abdominal pain, nausea, vomiting, diarrhea,                 change in bowel habits, loss of appetite GU: dysuria, change in color of urine, no urgency or frequency.   flank pain. MS:   joint pain, stiffness, decreased range of motion, back pain. Neuro-     nothing unusual Psych:  change in mood or affect.  depression or anxiety.   memory loss.  OBJ- Physical Exam General- Alert, Oriented, Affect-appropriate, Distress- none acute, + obese Skin- rash-none, lesions- none, excoriation- none Lymphadenopathy- none Head- atraumatic            Eyes- Gross vision intact, PERRLA, conjunctivae and secretions clear            Ears- Hearing, canals-normal            Nose- Clear, no-Septal dev, mucus, polyps, erosion, perforation             Throat- Mallampati III , mucosa clear , drainage- none, tonsils+, teeth+.  Neck- flexible , trachea midline, no stridor , thyroid nl, carotid no bruit Chest - symmetrical excursion , unlabored           Heart/CV- RRR , no murmur , no gallop  , no rub, nl s1 s2                           - JVD- none , edema- none, stasis changes- none, varices- none           Lung- clear to P&A, wheeze- none, cough- none , dullness-none, rub- none           Chest wall-  Abd-  Br/ Gen/ Rectal- Not done, not indicated Extrem- +R foot in ortho boot/ cane Neuro- grossly intact to observation

## 2022-06-04 ENCOUNTER — Ambulatory Visit (INDEPENDENT_AMBULATORY_CARE_PROVIDER_SITE_OTHER): Payer: Medicaid Other | Admitting: Internal Medicine

## 2022-06-04 ENCOUNTER — Encounter: Payer: Self-pay | Admitting: Physician Assistant

## 2022-06-04 ENCOUNTER — Encounter: Payer: Self-pay | Admitting: Internal Medicine

## 2022-06-04 VITALS — BP 124/82 | HR 79 | Ht 63.0 in | Wt 234.0 lb

## 2022-06-04 DIAGNOSIS — J4520 Mild intermittent asthma, uncomplicated: Secondary | ICD-10-CM

## 2022-06-04 DIAGNOSIS — G4733 Obstructive sleep apnea (adult) (pediatric): Secondary | ICD-10-CM | POA: Diagnosis not present

## 2022-06-04 NOTE — Assessment & Plan Note (Signed)
Benefits from CPAP and describes good compliance and control Plan- renew supplies, auto 5-15

## 2022-06-04 NOTE — Patient Instructions (Signed)
Order- DME [employer]- please replace mask of choice, supplies, hoses/ filters. Change CPAP to auto 5-15  Please call if we can help

## 2022-06-04 NOTE — Assessment & Plan Note (Signed)
Managed by her Allergist

## 2022-06-04 NOTE — Assessment & Plan Note (Signed)
Supporting goal of long-term weight loss.

## 2022-06-07 ENCOUNTER — Encounter: Payer: Self-pay | Admitting: Internal Medicine

## 2022-06-12 ENCOUNTER — Ambulatory Visit (INDEPENDENT_AMBULATORY_CARE_PROVIDER_SITE_OTHER): Payer: Medicaid Other | Admitting: Family

## 2022-06-12 ENCOUNTER — Encounter: Payer: Self-pay | Admitting: Family

## 2022-06-12 DIAGNOSIS — Z9889 Other specified postprocedural states: Secondary | ICD-10-CM

## 2022-06-12 DIAGNOSIS — Z8781 Personal history of (healed) traumatic fracture: Secondary | ICD-10-CM

## 2022-06-12 NOTE — Progress Notes (Signed)
Post-Op Visit Note   Patient: Allison Chandler           Date of Birth: 10-07-1990           MRN: [id number] Visit Date: 06/12/2022 PCP: Willeen Niece, PA  Chief Complaint:  Chief Complaint  Patient presents with   Right Ankle - Routine Post Op    04/24/2022 ORIF right ankle fx     HPI:  HPI The patient is a 32 year old woman seen status post open reduction internal fixation right ankle fracture on April 24, 2022.  She has been in a cam walker full weightbearing.  Denies any pain.  No concerns. Ortho Exam On examination of the right ankle her incision is well-healed there is no edema no erythema no tenderness  Visit Diagnoses: No diagnosis found.  Plan: May advance weightbearing in regular shoewear.  She will work on range of motion.  Discussed returning if she fails to improve as expected  Follow-Up Instructions: Return in about 3 weeks (around 07/03/2022), or if symptoms worsen or fail to improve.   Imaging: No results found.  Orders:  No orders of the defined types were placed in this encounter.  No orders of the defined types were placed in this encounter.    PMFS History: Patient Active Problem List   Diagnosis Date Noted   OSA (obstructive sleep apnea) 06/04/2022   Closed right ankle fracture 04/22/2022   Mild intermittent asthma without complication 78/58/8502   Other allergic rhinitis 03/29/2021   Allergic conjunctivitis of both eyes 03/29/2021   Shortness of breath 03/29/2021   Other adverse food reactions, not elsewhere classified, subsequent encounter 03/29/2021   Prediabetes 10/09/2020   Environmental allergies 08/31/2020   Essential hypertension 08/31/2020   RUQ abdominal pain 09/10/2019   Rectal bleeding 09/10/2019   Amenorrhea 04/29/2018   Hypothyroidism 04/29/2018   Pain in female genitalia on intercourse 04/29/2018   Magnetic resonance imaging of brain abnormal 04/24/2018   SIRS (systemic inflammatory response syndrome) ([place]) 04/09/2018    Bipolar disorder ([place]) 04/09/2018   ADD (attention deficit disorder) 04/09/2018   Upper respiratory tract infection 08/13/2017   Morbid obesity due to excess calories ([place]) 02/12/2016   High triglycerides 07/19/2015   Chronic back pain 04/21/2015   Chronic pelvic pain in female 04/21/2015   Nevus 04/21/2015   PCOS (polycystic ovarian syndrome) 04/21/2015   Past Medical History:  Diagnosis Date   ADHD (attention deficit hyperactivity disorder)    ADHD   Anemia    only when pregnant   Angio-edema    Anxiety    Asthma    Back pain, chronic    Bipolar 1 disorder ([place])    COVID    mild case - Summer 2023   Depression    Heart murmur    as an infant   History of kidney stones    HTN (hypertension)    PCOD (polycystic ovarian disease)    Pre-diabetes    Sleep apnea    uses a cpap    Family History  Problem Relation Age of Onset   Hypertension Mother    Ulcerative colitis Mother    Diabetes Mother    Heart disease Mother    Hyperlipidemia Mother     Past Surgical History:  Procedure Laterality Date   ORIF ANKLE FRACTURE Right 04/24/2022   Procedure: OPEN REDUCTION INTERNAL FIXATION (ORIF) RIGHT ANKLE FRACTURE;  Surgeon: [name], MD;  Location: [facility];  Service: Orthopedics;  Laterality: Right;   WISDOM TOOTH  EXTRACTION     Social History   Occupational History   Occupation: Environmental consultant  Tobacco Use   Smoking status: Every Day    Packs/day: 1.00    Types: Cigarettes   Smokeless tobacco: Never  Vaping Use   Vaping Use: Every day   Substances: Nicotine, Flavoring  Substance and Sexual Activity   Alcohol use: Yes    Comment: occasionally   Drug use: No   Sexual activity: Yes    Birth control/protection: I.U.D.

## 2022-06-28 NOTE — Telephone Encounter (Signed)
Mychart message sent by pt: [facility] ([place], [place])5 hours ago (4:32 AM)    I still have not had my supplies approved or shipped. I gave [employer] your information when they called and they said they needed further information from you. How can I get this fixed? Asap. I haven't had new supplies since July.     Looks like an order was placed on 1/2. Routing to [employer] for assistance. Please advise.

## 2022-06-28 NOTE — Telephone Encounter (Signed)
I called [employer] & spoke to [facility].  He states they were waiting for a [place] [employer] request for prior approval form and they received it from [place] yesterday.  Nothing further needed from our office. Will route back to triage so they can make pt aware thru [place].

## 2022-08-06 ENCOUNTER — Encounter: Payer: Self-pay | Admitting: Gastroenterology

## 2022-08-06 ENCOUNTER — Ambulatory Visit: Payer: Medicaid Other | Admitting: Gastroenterology

## 2022-08-06 VITALS — BP 124/70 | HR 70 | Ht 63.0 in | Wt 247.0 lb

## 2022-08-06 DIAGNOSIS — R112 Nausea with vomiting, unspecified: Secondary | ICD-10-CM | POA: Diagnosis not present

## 2022-08-06 DIAGNOSIS — R1011 Right upper quadrant pain: Secondary | ICD-10-CM

## 2022-08-06 MED ORDER — ONDANSETRON 4 MG PO TBDP: 4.0000 mg | ORAL_TABLET | Freq: Four times a day (QID) | ORAL | 1 refills | Status: DC | PRN

## 2022-08-06 NOTE — Patient Instructions (Signed)
You have been scheduled for a RUQ abdominal ultrasound on Monday, 3-25 at 9:30am. Please arrive at [facility] entrance at 9:00am.  Please have nothing to eat or drink after midnight.  You have been scheduled for a HIDA scan at 10:30am.  Should this appointment date or time not work well for you, please call radiology scheduling at [phone] as soon as possible.  _____________________________________________________________________ hepatobiliary (HIDA) scan is an imaging procedure used to diagnose problems in the liver, gallbladder and bile ducts. In the HIDA scan, a radioactive chemical or tracer is injected into a vein in your arm. The tracer is handled by the liver like bile. Bile is a fluid produced and excreted by your liver that helps your digestive system break down fats in the foods you eat. Bile is stored in your gallbladder and the gallbladder releases the bile when you eat a meal. A special nuclear medicine scanner (gamma camera) tracks the flow of the tracer from your liver into your gallbladder and small intestine.  During your HIDA scan  You'll be asked to change into a hospital gown before your HIDA scan begins. Your health care team will position you on a table, usually on your back. The radioactive tracer is then injected into a vein in your arm.The tracer travels through your bloodstream to your liver, where it's taken up by the bile-producing cells. The radioactive tracer travels with the bile from your liver into your gallbladder and through your bile ducts to your small intestine.You may feel some pressure while the radioactive tracer is injected into your vein. As you lie on the table, a special gamma camera is positioned over your abdomen taking pictures of the tracer as it moves through your body. The gamma camera takes pictures continually for about an hour. You'll need to keep still during the HIDA scan. This can become uncomfortable, but you may find that you can lessen the  discomfort by taking deep breaths and thinking about other things. Tell your health care team if you're uncomfortable. The radiologist will watch on a computer the progress of the radioactive tracer through your body. The HIDA scan may be stopped when the radioactive tracer is seen in the gallbladder and enters your small intestine. This typically takes about an hour. In some cases extra imaging will be performed if original images aren't satisfactory, if morphine is given to help visualize the gallbladder or if the medication CCK is given to look at the contraction of the gallbladder. This test typically takes 2 hours to complete. ________________________________________________________________________   We have sent the following medications to your pharmacy for you to pick up at your convenience: Zofran '4mg'$  ODT: dissolve 1 tablet orally every 6 hours as needed for nausea  Thank you for entrusting me with your care and for choosing [facility]   If your blood pressure at your visit was 140/90 or greater, please contact your primary care physician to follow up on this.  _______________________________________________________  If you are age 16 or older, your body mass index should be between 23-30. Your Body mass index is 43.75 kg/m. If this is out of the aforementioned range listed, please consider follow up with your Primary Care Provider.  If you are age 85 or younger, your body mass index should be between 19-25. Your Body mass index is 43.75 kg/m. If this is out of the aformentioned range listed, please consider follow up with your Primary Care Provider.   ________________________________________________________  The [facility] providers would like to encourage you to use [facility] to communicate with providers for non-urgent requests or questions.  Due to long hold times on the telephone, sending your provider a message by [facility] may be a faster and more  efficient way to get a response.  Please allow 48 business hours for a response.  Please remember that this is for non-urgent requests.  _______________________________________________________  Due to recent changes in healthcare laws, you may see the results of your imaging and laboratory studies on MyChart before your provider has had a chance to review them.  We understand that in some cases there may be results that are confusing or concerning to you. Not all laboratory results come back in the same time frame and the provider may be waiting for multiple results in order to interpret others.  Please give [place] 48 hours in order for your provider to thoroughly review all the results before contacting the office for clarification of your results.

## 2022-08-06 NOTE — Progress Notes (Signed)
08/06/2022 Allison Chandler [id number] 08-Aug-1990   HISTORY OF PRESENT ILLNESS: This is a 32 year old female who was seen by me almost 3 years ago in April 2021 for complaints of right upper quadrant abdominal pain and rectal bleeding.  For the RUQ abdominal pain she was scheduled for HIDA scan.  For the rectal bleeding we scheduled colonoscopy.  She ended up canceling both of those and did not reschedule.  Now she is here again with complaints of right upper quadrant abdominal pain that she describes as cramps.  She also has a lot of nausea.  She says that this seems to happen more so if she eats greasy foods, etc., but sometimes it comes out of nowhere.  She said that she has not had any further rectal bleeding.  She was having issues with diarrhea.  Stool for infectious sources were negative back in November.  She says that that is gotten a little bit better.  Says she has about 3-4 bowel movements a day, more loose/mushy than not.  CBC, CMP, TSH from 04/2022 were unremarkable.   Past Medical History:  Diagnosis Date   ADHD (attention deficit hyperactivity disorder)    ADHD   Amenorrhea    Anemia    only when pregnant   Angio-edema    Anxiety    Asthma    Back pain, chronic    Bipolar 1 disorder (HCC)    Chronic back pain    COVID    mild case - Summer 2023   Depression    Heart murmur    as an infant   History of kidney stones    History of pelvic inflammatory disease    HTN (hypertension)    Hypothyroidism    PCOD (polycystic ovarian disease)    Pre-diabetes    Sleep apnea    uses a cpap   Past Surgical History:  Procedure Laterality Date   ORIF ANKLE FRACTURE Right 04/24/2022   Procedure: OPEN REDUCTION INTERNAL FIXATION (ORIF) RIGHT ANKLE FRACTURE;  Surgeon: [name], MD;  Location: [facility];  Service: Orthopedics;  Laterality: Right;   WISDOM TOOTH EXTRACTION      reports that she has quit smoking. Her smoking use included cigarettes. She smoked an average of 1  pack per day. She has never used smokeless tobacco. She reports current alcohol use. She reports that she does not use drugs. family history includes Diabetes in her mother; Heart disease in her mother; Hyperlipidemia in her mother; Hypertension in her mother; Ulcerative colitis in her mother. Allergies  Allergen Reactions   Other     Dairy - GI issues    Wellbutrin [Bupropion] Rash      Outpatient Encounter Medications as of 08/06/2022  Medication Sig   acetaminophen (TYLENOL) 500 MG tablet Take 1,000 mg by mouth every 6 (six) hours as needed for moderate pain or headache. Every 6 to 8 hours as needed for pain   albuterol (VENTOLIN HFA) 108 (90 Base) MCG/ACT inhaler Inhale 2 puffs into the lungs every 6 (six) hours as needed for wheezing or shortness of breath.    amLODipine (NORVASC) 10 MG tablet Take 10 mg by mouth daily.   azelastine (ASTELIN) 0.1 % nasal spray Place 1-2 sprays into both nostrils 2 (two) times daily as needed (nasal drainage). Use in each nostril as directed (Patient taking differently: Place 1 spray into both nostrils daily. Use in each nostril as directed)   diclofenac (VOLTAREN) 75 MG EC tablet Take 75  mg by mouth 2 (two) times daily.   fluticasone (FLONASE) 50 MCG/ACT nasal spray Place 1 spray into both nostrils 2 (two) times daily.   fluticasone-salmeterol (ADVAIR HFA) 115-21 MCG/ACT inhaler Inhale 2 puffs into the lungs 2 (two) times daily.   ibuprofen (ADVIL) 800 MG tablet Take 1 tablet (800 mg total) by mouth 3 (three) times daily.   Ketotifen Fumarate (ALLERGY EYE DROPS OP) Place 1 drop into both eyes daily as needed (allergies).   Lactase (LACTAID FAST ACT) 9000 units CHEW Chew 9,000 Units by mouth as needed (consuming dairy).   levocetirizine (XYZAL) 5 MG tablet Take 1 tablet (5 mg total) by mouth every evening.   lisdexamfetamine (VYVANSE) 30 MG capsule Take 30 mg by mouth daily.   LORazepam (ATIVAN) 0.5 MG tablet Take 0.5 mg by mouth daily as needed for  anxiety.   montelukast (SINGULAIR) 10 MG tablet Take 10 mg by mouth daily.   naproxen (NAPROSYN) 500 MG tablet Take 500 mg by mouth 2 (two) times daily.   prazosin (MINIPRESS) 2 MG capsule Take 2 mg by mouth daily.   propranolol (INDERAL) 40 MG tablet Take 40 mg by mouth 2 (two) times daily.   tiZANidine (ZANAFLEX) 4 MG tablet Take 6 mg by mouth every 8 (eight) hours as needed for muscle spasms.   valACYclovir (VALTREX) 500 MG tablet Take 500 mg by mouth daily.   Vilazodone HCl 20 MG TABS Take 20 mg by mouth daily.   VRAYLAR 4.5 MG CAPS Take 1 capsule by mouth every evening.    [DISCONTINUED] diphenhydrAMINE (BENADRYL) 25 MG tablet Take 25 mg by mouth every 6 (six) hours as needed for allergies.   No facility-administered encounter medications on file as of 08/06/2022.    REVIEW OF SYSTEMS  : All other systems reviewed and negative except where noted in the History of Present Illness.   PHYSICAL EXAM: BP 124/70   Pulse 70   Ht '5\' 3"'$  (1.6 m)   Wt 247 lb (112 kg)   BMI 43.75 kg/m  General: Well developed white female in no acute distress Head: Normocephalic and atraumatic Eyes:  Sclerae anicteric, conjunctiva pink. Ears: Normal auditory acuity Lungs: Clear throughout to auscultation; no W/R/R. Heart: Regular rate and rhythm; no M/R/G. Abdomen: Soft, non-distended.  BS present.  Mild RUQ TTP. Musculoskeletal: Symmetrical with no gross deformities  Skin: No lesions on visible extremities Extremities: No edema  Neurological: Alert oriented x 4, grossly non-focal Psychological:  Alert and cooperative. Normal mood and affect  ASSESSMENT AND PLAN: *Right upper quadrant abdominal pain and nausea: Had similar complaints in the past and we had scheduled a HIDA scan back in 2021, but she never proceeded.  Will plan for right upper quadrant abdominal ultrasound and if negative then HIDA scan with CCK.  Will give Zofran for nausea.  Prescription sent to pharmacy. *Diarrhea: Somewhat better  recently.  Stool studies were done for infectious source back in November 2023 and were negative.  Could be associated with gallbladder if in fact gallbladder is the issue.  Could consider colonoscopy if diarrhea continues and gallbladder workup is negative.   CC:  Willeen Niece, [place]

## 2022-08-10 NOTE — Progress Notes (Signed)
Agree with assessment/plan.  Raj Tishina Lown, MD [facility] [phone]  

## 2022-08-26 ENCOUNTER — Ambulatory Visit (HOSPITAL_COMMUNITY)
Admission: RE | Admit: 2022-08-26 | Discharge: 2022-08-26 | Disposition: A | Discharge status: Home / Self Care | Payer: Medicaid Other | Source: Ambulatory Visit | Attending: Gastroenterology | Admitting: Gastroenterology

## 2022-08-26 DIAGNOSIS — R1011 Right upper quadrant pain: Secondary | ICD-10-CM

## 2022-08-26 DIAGNOSIS — R112 Nausea with vomiting, unspecified: Secondary | ICD-10-CM | POA: Diagnosis present

## 2022-08-26 MED ORDER — TECHNETIUM TC 99M MEBROFENIN IV KIT
5.0800 | PACK | Freq: Once | INTRAVENOUS | Status: AC | PRN
  Administered 2022-08-26: 5.08 via INTRAVENOUS

## 2022-08-27 ENCOUNTER — Encounter: Payer: Self-pay | Admitting: Internal Medicine

## 2022-08-27 ENCOUNTER — Ambulatory Visit: Payer: Medicaid Other | Admitting: Internal Medicine

## 2022-08-27 ENCOUNTER — Other Ambulatory Visit: Payer: Self-pay

## 2022-08-27 VITALS — BP 120/90 | HR 67 | Temp 98.8°F | Ht 63.0 in | Wt 245.7 lb

## 2022-08-27 DIAGNOSIS — J4530 Mild persistent asthma, uncomplicated: Secondary | ICD-10-CM | POA: Diagnosis not present

## 2022-08-27 DIAGNOSIS — Z91018 Allergy to other foods: Secondary | ICD-10-CM | POA: Diagnosis not present

## 2022-08-27 DIAGNOSIS — J3089 Other allergic rhinitis: Secondary | ICD-10-CM

## 2022-08-27 MED ORDER — FLUTICASONE PROPIONATE 50 MCG/ACT NA SUSP: 2.0000 | Freq: Every day | NASAL | 5 refills | Status: DC

## 2022-08-27 MED ORDER — AZELASTINE HCL 0.1 % NA SOLN: 2.0000 | Freq: Two times a day (BID) | NASAL | 5 refills | Status: DC

## 2022-08-27 MED ORDER — LEVOCETIRIZINE DIHYDROCHLORIDE 5 MG PO TABS: 5.0000 mg | ORAL_TABLET | Freq: Every evening | ORAL | 5 refills | Status: DC

## 2022-08-27 MED ORDER — FLUTICASONE-SALMETEROL 115-21 MCG/ACT IN AERO: 2.0000 | INHALATION_SPRAY | Freq: Two times a day (BID) | RESPIRATORY_TRACT | 5 refills | Status: DC

## 2022-08-27 MED ORDER — MONTELUKAST SODIUM 10 MG PO TABS: 10.0000 mg | ORAL_TABLET | Freq: Every day | ORAL | 5 refills | Status: DC

## 2022-08-27 MED ORDER — ALBUTEROL SULFATE HFA 108 (90 BASE) MCG/ACT IN AERS: 2.0000 | INHALATION_SPRAY | Freq: Four times a day (QID) | RESPIRATORY_TRACT | 1 refills | Status: DC | PRN

## 2022-08-27 MED ORDER — SPACER/AERO-HOLDING CHAMBERS DEVI: 1.0000 | Freq: Two times a day (BID) | 1 refills | Status: AC

## 2022-08-27 NOTE — Patient Instructions (Addendum)
Allergic Rhinitis - Positive skin test 2022: cockroach and dust mite - Avoidance measures discussed. - Use nasal saline rinses before nose sprays such as with Neilmed Sinus Rinse.  Use distilled water.   - Use Flonase 2 sprays each nostril daily. Aim upward and outward. - Use Azelastine 1-2 sprays each nostril twice daily as needed. Aim upward and outward. - Use Xyzal 5mg  daily.  - Use Singulair 10mg  daily. Stop if there are any mood or behavioral changes. - For eyes, use Olopatadine or Ketotifen 1 eye drop daily as needed for itchy, watery eyes.  Available over the counter, if not covered by insurance.  - Consider allergy shots as long term control of your symptoms by teaching your immune system to be more tolerant of your allergy triggers   Asthma: - Work on vaping cessation.   - Spirometry today was normal.  - Maintenance inhaler: continue Advair HFA 115-26mcg 2 puffs twice daily with spacer and Singulair 10mg  daily.  - Rescue inhaler: Albuterol 2 puffs via spacer or 1 vial via nebulizer every 4-6 hours as needed for respiratory symptoms of cough, shortness of breath, or wheezing Asthma control goals:  Full participation in all desired activities (may need albuterol before activity) Albuterol use two times or less a week on average (not counting use with activity) Cough interfering with sleep two times or less a month Oral steroids no more than once a year No hospitalizations   Positive Food Testing - Avoids shellfish due to positive testing. Never had a reaction before. - Skin testing was negative to shellfish last visit in 2022. - Will obtain blood testing to confirm.  If both are negative, recommend reintroducing shellfish.

## 2022-08-27 NOTE — Progress Notes (Signed)
FOLLOW UP Date of Service/Encounter:  08/27/22   Subjective:  Allison Chandler (DOB: Jun 12, 1990) is a 32 y.o. female who returns to the [facility] on 08/27/2022 for follow up for mild persistent asthma, allergic rhinoconjunctivitis and food allergies.   History obtained from: chart review and patient. Last visit was with Dr. Maudie Mercury on March 29, 2021 and was previously followed by [facility].  Her skin test at the visit was positive for cockroach and dust mites.  For her allergic rhinoconjunctivitis, she was started on Flonase, azelastine, singulair and Xyzal daily.   Also with SOB/cough/wheezing with nocturnal symptoms.  Spirometry did not show obstruction/reversibility but does have symptomatic improvement with use of Albuterol so was started on Advair HFA 115-21 2 puffs BID.  Rhinoconjunctivitis: Reports having increased symptoms recently with onset of spring.  Having runny nose, congestion and itchy watery eyes.  Taking Singulair, Xyzal and Flonase daily.  Also uses Azelastine PRN.   Asthma: Reports doing better with use of Advair but still not completely normal Does have some shortness of breath and nighttime awakenings with cough.  Activity causes chest tightness. Using Advair HFA 115 2 puffs BID, no spacer.  She has had urgent care visits for viral illness but can't recall if she needed any oral prednisone.  No ER visits.    Positive Food Testing: Was tested at [facility] and was positive for multiple food allergens. Denies any history of reactions with foods.  Currently eats foods with peanuts, cashews, pistachio, walnut, pecans without any problem. Eats processed foods like bars/cereals that have soy without issues. Eats salmon without any issues but just doesn't like seafood. Only food she is truly avoiding is shellfish. She has had it in the past without any issues but since that allergy test has not eaten any shellfish.    Past Medical  History: Past Medical History:  Diagnosis Date   ADHD (attention deficit hyperactivity disorder)    ADHD   Amenorrhea    Anemia    only when pregnant   Angio-edema    Anxiety    Asthma    Back pain, chronic    Bipolar 1 disorder ([facility])    Chronic back pain    COVID    mild case - Summer 2023   Depression    Heart murmur    as an infant   History of kidney stones    History of pelvic inflammatory disease    HTN (hypertension)    Hypothyroidism    PCOD (polycystic ovarian disease)    Pre-diabetes    Sleep apnea    uses a cpap    Objective:  BP (!) 130/90   Pulse 67   Temp 98.8 F (37.1 C)   Ht 5\' 3"  (1.6 m)   Wt 245 lb 11.2 oz (111.4 kg)   LMP 08/23/2022 (Exact Date)   SpO2 97%   BMI 43.52 kg/m  Body mass index is 43.52 kg/m. Physical Exam: GEN: alert, well developed HEENT: clear conjunctiva, nose with moderate inferior turbinate hypertrophy, pink nasal mucosa, clear rhinorrhea, no cobblestoning HEART: regular rate and rhythm, no murmur LUNGS: clear to auscultation bilaterally, no coughing, unlabored respiration SKIN: no rashes or lesions  Spirometry:  Tracings reviewed. Her effort: Good reproducible efforts. FVC: 3.04L FEV1: 2.38L, 77% predicted FEV1/FVC ratio: 78% Interpretation: Spirometry consistent with normal pattern.  Please see scanned spirometry results for details.  Assessment:   1. Mild persistent asthma without complication   2. Perennial allergic  rhinitis   3. History of food allergy     Plan/Recommendations:  Allergic Rhinitis - Uncontrolled, discussed addition of INAH.   - Positive skin test 2022: cockroach and dust mite - Avoidance measures discussed. - Use nasal saline rinses before nose sprays such as with Neilmed Sinus Rinse.  Use distilled water.   - Use Flonase 2 sprays each nostril daily. Aim upward and outward. - Use Azelastine 1-2 sprays each nostril twice daily as needed. Aim upward and outward. - Use Xyzal 5mg  daily.  -  Use Singulair 10mg  daily. Stop if there are any mood or behavioral changes. - For eyes, use Olopatadine or Ketotifen 1 eye drop daily as needed for itchy, watery eyes.  Available over the counter, if not covered by insurance.  - Consider allergy shots as long term control of your symptoms by teaching your immune system to be more tolerant of your allergy triggers. Of note, she is on propranolol for HTN.    Mild Persistent Asthma: - Uncontrolled, discussed vaping cessation and using spacer.  - Work on vaping cessation.   - Spirometry today was normal.  - Maintenance inhaler: continue Advair HFA 115-33mcg 2 puffs twice daily with spacer and Singulair 10mg  daily.  - Rescue inhaler: Albuterol 2 puffs via spacer or 1 vial via nebulizer every 4-6 hours as needed for respiratory symptoms of cough, shortness of breath, or wheezing Asthma control goals:  Full participation in all desired activities (may need albuterol before activity) Albuterol use two times or less a week on average (not counting use with activity) Cough interfering with sleep two times or less a month Oral steroids no more than once a year No hospitalizations   Positive Food Testing - Avoids shellfish due to positive testing. Never had a reaction before.  - SPT 03/29/2021 with questionable reaction to oyster, other shellfish/fish/treenuts/peanut/soy/milk/casein were negative.   - Will obtain blood testing to confirm.  If negative, recommend reintroducing shellfish and can do it at home since she has never had a reaction.     Return in about 2 months (around 10/27/2022).  Harlon Flor, MD [facility]

## 2022-08-28 ENCOUNTER — Encounter: Payer: Self-pay | Admitting: Internal Medicine

## 2022-09-01 LAB — ALLERGEN PROFILE, SHELLFISH
Clam IgE: 0.14 kU/L — AB
F023-IgE Crab: <0.1 kU/L
F080-IgE Lobster: <0.1 kU/L
F290-IgE Oyster: <0.1 kU/L
Scallop IgE: 0.12 kU/L — AB
Shrimp IgE: 0.63 kU/L — AB

## 2022-09-02 ENCOUNTER — Other Ambulatory Visit: Payer: Self-pay | Admitting: Internal Medicine

## 2022-09-02 MED ORDER — EPINEPHRINE 0.3 MG/0.3ML IJ SOAJ: 0.3000 mg | INTRAMUSCULAR | 1 refills | Status: DC | PRN

## 2022-09-02 NOTE — Progress Notes (Signed)
Epipen sent in due to shellfish allergy.

## 2022-10-16 ENCOUNTER — Other Ambulatory Visit: Payer: Self-pay | Admitting: Gastroenterology

## 2022-10-25 ENCOUNTER — Ambulatory Visit: Payer: Medicaid Other | Admitting: Internal Medicine

## 2022-11-16 ENCOUNTER — Other Ambulatory Visit: Payer: Self-pay | Admitting: Orthopedic Surgery

## 2022-11-18 ENCOUNTER — Ambulatory Visit (AMBULATORY_SURGERY_CENTER): Payer: Medicaid Other

## 2022-11-18 VITALS — Ht 63.0 in | Wt 245.0 lb

## 2022-11-18 DIAGNOSIS — R112 Nausea with vomiting, unspecified: Secondary | ICD-10-CM

## 2022-11-18 DIAGNOSIS — R1011 Right upper quadrant pain: Secondary | ICD-10-CM

## 2022-11-18 MED ORDER — NA SULFATE-K SULFATE-MG SULF 17.5-3.13-1.6 GM/177ML PO SOLN: 1.0000 | Freq: Once | ORAL | 0 refills | Status: AC

## 2022-11-18 NOTE — Progress Notes (Signed)

## 2022-12-13 ENCOUNTER — Encounter: Payer: Self-pay | Admitting: Gastroenterology

## 2022-12-13 ENCOUNTER — Ambulatory Visit (AMBULATORY_SURGERY_CENTER): Payer: Medicaid Other | Admitting: Gastroenterology

## 2022-12-13 VITALS — BP 139/93 | HR 57 | Temp 99.3°F | Resp 20 | Ht 63.0 in | Wt 245.0 lb

## 2022-12-13 DIAGNOSIS — R197 Diarrhea, unspecified: Secondary | ICD-10-CM | POA: Diagnosis not present

## 2022-12-13 DIAGNOSIS — R1011 Right upper quadrant pain: Secondary | ICD-10-CM | POA: Diagnosis not present

## 2022-12-13 DIAGNOSIS — K2100 Gastro-esophageal reflux disease with esophagitis, without bleeding: Secondary | ICD-10-CM

## 2022-12-13 DIAGNOSIS — K2950 Unspecified chronic gastritis without bleeding: Secondary | ICD-10-CM | POA: Diagnosis not present

## 2022-12-13 DIAGNOSIS — K208 Other esophagitis without bleeding: Secondary | ICD-10-CM

## 2022-12-13 MED ORDER — SODIUM CHLORIDE 0.9 % IV SOLN: 500.0000 mL | Freq: Once | INTRAVENOUS | Status: DC

## 2022-12-13 MED ORDER — DICYCLOMINE HCL 10 MG PO CAPS
10.0000 mg | ORAL_CAPSULE | Freq: Two times a day (BID) | ORAL | 0 refills | Status: DC
Start: 2022-12-13 — End: 2023-01-07

## 2022-12-13 MED ORDER — PANTOPRAZOLE SODIUM 40 MG PO TBEC: 40.0000 mg | DELAYED_RELEASE_TABLET | Freq: Every day | ORAL | 3 refills | Status: DC

## 2022-12-13 NOTE — Patient Instructions (Addendum)
Resume all of your regular medications today.  Be sure to take your new medication on an empty stomach or else it won't work.  Take your bental as ordered for abdominal pain. Read your discharge instructions.  YOU HAD AN ENDOSCOPIC PROCEDURE TODAY AT THE [facility]:   Refer to the procedure report that was given to you for any specific questions about what was found during the examination.  If the procedure report does not answer your questions, please call your gastroenterologist to clarify.  If you requested that your care partner not be given the details of your procedure findings, then the procedure report has been included in a sealed envelope for you to review at your convenience later.  YOU SHOULD EXPECT: Some feelings of bloating in the abdomen. Passage of more gas than usual.  Walking can help get rid of the air that was put into your GI tract during the procedure and reduce the bloating. If you had a lower endoscopy (such as a colonoscopy or flexible sigmoidoscopy) you may notice spotting of blood in your stool or on the toilet paper. If you underwent a bowel prep for your procedure, you may not have a normal bowel movement for a few days.  Please Note:  You might notice some irritation and congestion in your nose or some drainage.  This is from the oxygen used during your procedure.  There is no need for concern and it should clear up in a day or so.  SYMPTOMS TO REPORT IMMEDIATELY:  Following lower endoscopy (colonoscopy or flexible sigmoidoscopy):  Excessive amounts of blood in the stool  Significant tenderness or worsening of abdominal pains  Swelling of the abdomen that is new, acute  Fever of 100F or higher  Following upper endoscopy (EGD)  Vomiting of blood or coffee ground material  New chest pain or pain under the shoulder blades  Painful or persistently difficult swallowing  New shortness of breath  Fever of 100F or higher  Black, tarry-looking stools  For  urgent or emergent issues, a gastroenterologist can be reached at any hour by calling [phone]. Do not use MyChart messaging for urgent concerns.    DIET:  We do recommend a small meal at first, but then you may proceed to your regular diet.  Drink plenty of fluids but you should avoid alcoholic beverages for 24 hours.  ACTIVITY:  You should plan to take it easy for the rest of today and you should NOT DRIVE or use heavy machinery until tomorrow (because of the sedation medicines used during the test).    FOLLOW UP: Our staff will call the number listed on your records the next business day following your procedure.  We will call around 7:15- 8:00 am to check on you and address any questions or concerns that you may have regarding the information given to you following your procedure. If we do not reach you, we will leave a message.     If any biopsies were taken you will be contacted by phone or by letter within the next 1-3 weeks.  Please call us at [phone] if you have not heard about the biopsies in 3 weeks.    SIGNATURES/CONFIDENTIALITY: You and/or your care partner have signed paperwork which will be entered into your electronic medical record.  These signatures attest to the fact that that the information above on your After Visit Summary has been reviewed and is understood.  Full responsibility of the confidentiality of this discharge  information lies with you and/or your care-partner.

## 2022-12-13 NOTE — Progress Notes (Signed)
Report to PACU, RN, vss, BBS= Clear.  

## 2022-12-13 NOTE — Progress Notes (Signed)
Pt's states no medical or surgical changes since previsit or office visit. 

## 2022-12-13 NOTE — Op Note (Signed)
[facility] Patient Name: Allison Chandler Procedure Date: 12/13/2022 10:14 AM MRN: [id number] Endoscopist: Lynann Bologna , MD, [id number] Age: 32 Referring MD:  Date of Birth: Aug 27, 1990 Gender: Female Account #: [id number] Procedure:                Upper GI endoscopy Indications:              Abdominal pain in the right upper quadrant with neg                            US/HIDA with EF Medicines:                Monitored Anesthesia Care Procedure:                Pre-Anesthesia Assessment:                           - Prior to the procedure, a History and Physical                            was performed, and patient medications and                            allergies were reviewed. The patient's tolerance of                            previous anesthesia was also reviewed. The risks                            and benefits of the procedure and the sedation                            options and risks were discussed with the patient.                            All questions were answered, and informed consent                            was obtained. Prior Anticoagulants: The patient has                            taken no anticoagulant or antiplatelet agents. ASA                            Grade Assessment: II - A patient with mild systemic                            disease. After reviewing the risks and benefits,                            the patient was deemed in satisfactory condition to                            undergo the procedure.  After obtaining informed consent, the endoscope was                            passed under direct vision. Throughout the                            procedure, the patient's blood pressure, pulse, and                            oxygen saturations were monitored continuously. The                            Olympus Scope [id number] was introduced through the                            mouth, and advanced to the  second part of duodenum.                            The upper GI endoscopy was accomplished without                            difficulty. The patient tolerated the procedure                            well. Scope In: Scope Out: Findings:                 LA Grade A (one or more mucosal breaks less than 5                            mm, not extending between tops of 2 mucosal folds)                            esophagitis with no bleeding was found 38 cm from                            the incisors. Biopsies were taken with a cold                            forceps for histology.                           The entire examined stomach was normal. Biopsies                            were taken with a cold forceps for histology.                           The examined duodenum was normal. Biopsies for                            histology were taken with a cold forceps for  evaluation of celiac disease. Complications:            No immediate complications. Estimated Blood Loss:     Estimated blood loss: none. Impression:               - LA Grade A reflux esophagitis with no bleeding.                            Biopsied.                           - Normal stomach. Biopsied.                           - Normal examined duodenum. Biopsied. Recommendation:           - Patient has a contact number available for                            emergencies. The signs and symptoms of potential                            delayed complications were discussed with the                            patient. Return to normal activities tomorrow.                            Written discharge instructions were provided to the                            patient.                           - Resume previous diet.                           - Avoid all nonsteroidals if possible                           - Start Protonix 40 mg p.o. daily #90, 4RF                           - Await pathology  results.                           - Follow an antireflux regimen.                           - Proceed with colonoscopy.                           - The findings and recommendations were discussed                            with the patient's family. Lynann Bologna, MD 12/13/2022 11:00:30 AM This report has been signed electronically.

## 2022-12-13 NOTE — Progress Notes (Signed)
08/06/2022 Allison Chandler [id number] 07/31/1990     HISTORY OF PRESENT ILLNESS: This is a 32 year old female who was seen by me almost 3 years ago in April 2021 for complaints of right upper quadrant abdominal pain and rectal bleeding.  For the RUQ abdominal pain she was scheduled for HIDA scan.  For the rectal bleeding we scheduled colonoscopy.  She ended up canceling both of those and did not reschedule.  Now she is here again with complaints of right upper quadrant abdominal pain that she describes as cramps.  She also has a lot of nausea.  She says that this seems to happen more so if she eats greasy foods, etc., but sometimes it comes out of nowhere.  She said that she has not had any further rectal bleeding.  She was having issues with diarrhea.  Stool for infectious sources were negative back in November.  She says that that is gotten a little bit better.  Says she has about 3-4 bowel movements a day, more loose/mushy than not.  CBC, CMP, TSH from 04/2022 were unremarkable.         Past Medical History:  Diagnosis Date   ADHD (attention deficit hyperactivity disorder)      ADHD   Amenorrhea     Anemia      only when pregnant   Angio-edema     Anxiety     Asthma     Back pain, chronic     Bipolar 1 disorder (HCC)     Chronic back pain     COVID      mild case - Summer 2023   Depression     Heart murmur      as an infant   History of kidney stones     History of pelvic inflammatory disease     HTN (hypertension)     Hypothyroidism     PCOD (polycystic ovarian disease)     Pre-diabetes     Sleep apnea      uses a cpap             Past Surgical History:  Procedure Laterality Date   ORIF ANKLE FRACTURE Right 04/24/2022    Procedure: OPEN REDUCTION INTERNAL FIXATION (ORIF) RIGHT ANKLE FRACTURE;  Surgeon: Nadara Mustard, MD;  Location: [facility] OR;  Service: Orthopedics;  Laterality: Right;   WISDOM TOOTH EXTRACTION             reports that she has quit smoking. Her  smoking use included cigarettes. She smoked an average of 1 pack per day. She has never used smokeless tobacco. She reports current alcohol use. She reports that she does not use drugs. family history includes Diabetes in her mother; Heart disease in her mother; Hyperlipidemia in her mother; Hypertension in her mother; Ulcerative colitis in her mother. Allergies       Allergies  Allergen Reactions   Other        Dairy - GI issues    Wellbutrin [Bupropion] Rash              Outpatient Encounter Medications as of 08/06/2022  Medication Sig   acetaminophen (TYLENOL) 500 MG tablet Take 1,000 mg by mouth every 6 (six) hours as needed for moderate pain or headache. Every 6 to 8 hours as needed for pain   albuterol (VENTOLIN HFA) 108 (90 Base) MCG/ACT inhaler Inhale 2 puffs into the lungs every 6 (six) hours as needed for wheezing or shortness of  breath.    amLODipine (NORVASC) 10 MG tablet Take 10 mg by mouth daily.   azelastine (ASTELIN) 0.1 % nasal spray Place 1-2 sprays into both nostrils 2 (two) times daily as needed (nasal drainage). Use in each nostril as directed (Patient taking differently: Place 1 spray into both nostrils daily. Use in each nostril as directed)   diclofenac (VOLTAREN) 75 MG EC tablet Take 75 mg by mouth 2 (two) times daily.   fluticasone (FLONASE) 50 MCG/ACT nasal spray Place 1 spray into both nostrils 2 (two) times daily.   fluticasone-salmeterol (ADVAIR HFA) 115-21 MCG/ACT inhaler Inhale 2 puffs into the lungs 2 (two) times daily.   ibuprofen (ADVIL) 800 MG tablet Take 1 tablet (800 mg total) by mouth 3 (three) times daily.   Ketotifen Fumarate (ALLERGY EYE DROPS OP) Place 1 drop into both eyes daily as needed (allergies).   Lactase (LACTAID FAST ACT) 9000 units CHEW Chew 9,000 Units by mouth as needed (consuming dairy).   levocetirizine (XYZAL) 5 MG tablet Take 1 tablet (5 mg total) by mouth every evening.   lisdexamfetamine (VYVANSE) 30 MG capsule Take 30 mg by mouth  daily.   LORazepam (ATIVAN) 0.5 MG tablet Take 0.5 mg by mouth daily as needed for anxiety.   montelukast (SINGULAIR) 10 MG tablet Take 10 mg by mouth daily.   naproxen (NAPROSYN) 500 MG tablet Take 500 mg by mouth 2 (two) times daily.   prazosin (MINIPRESS) 2 MG capsule Take 2 mg by mouth daily.   propranolol (INDERAL) 40 MG tablet Take 40 mg by mouth 2 (two) times daily.   tiZANidine (ZANAFLEX) 4 MG tablet Take 6 mg by mouth every 8 (eight) hours as needed for muscle spasms.   valACYclovir (VALTREX) 500 MG tablet Take 500 mg by mouth daily.   Vilazodone HCl 20 MG TABS Take 20 mg by mouth daily.   VRAYLAR 4.5 MG CAPS Take 1 capsule by mouth every evening.    [DISCONTINUED] diphenhydrAMINE (BENADRYL) 25 MG tablet Take 25 mg by mouth every 6 (six) hours as needed for allergies.      No facility-administered encounter medications on file as of 08/06/2022.        REVIEW OF SYSTEMS  : All other systems reviewed and negative except where noted in the History of Present Illness.     PHYSICAL EXAM: BP 124/70   Pulse 70   Ht 5\' 3"  (1.6 m)   Wt 247 lb (112 kg)   BMI 43.75 kg/m  General: Well developed white female in no acute distress Head: Normocephalic and atraumatic Eyes:  Sclerae anicteric, conjunctiva pink. Ears: Normal auditory acuity Lungs: Clear throughout to auscultation; no W/R/R. Heart: Regular rate and rhythm; no M/R/G. Abdomen: Soft, non-distended.  BS present.  Mild RUQ TTP. Musculoskeletal: Symmetrical with no gross deformities  Skin: No lesions on visible extremities Extremities: No edema  Neurological: Alert oriented x 4, grossly non-focal Psychological:  Alert and cooperative. Normal mood and affect   ASSESSMENT AND PLAN: *Right upper quadrant abdominal pain and nausea: Had similar complaints in the past and we had scheduled a HIDA scan back in 2021, but she never proceeded.  Will plan for right upper quadrant abdominal ultrasound and if negative then HIDA scan with  [facility].  Will give Zofran for nausea.  Prescription sent to pharmacy. *Diarrhea: Somewhat better recently.  Stool studies were done for infectious source back in November 2023 and were negative.  Could be associated with gallbladder if in fact gallbladder is the  issue.  Could consider colonoscopy if diarrhea continues and gallbladder workup is negative.     CC:  Maud Deed, [place]   Attending physician's note   I have taken history, reviewed the chart and examined the patient. I performed a substantive portion of this encounter, including complete performance of at least one of the key components, in conjunction with the APP. I agree with the Advanced Practitioner's note, impression and recommendations.   For EGD/colon today   Edman Circle, MD [facility] [phone]

## 2022-12-13 NOTE — Op Note (Signed)
[facility] Patient Name: Allison Chandler Procedure Date: 12/13/2022 10:12 AM MRN: [id number] Endoscopist: Lynann Bologna , MD, [id number] Age: 32 Referring MD:  Date of Birth: 08-16-1990 Gender: Female Account #: [id number] Procedure:                Colonoscopy Indications:              Clinically significant diarrhea of unexplained                            origin Medicines:                Monitored Anesthesia Care Procedure:                Pre-Anesthesia Assessment:                           - Prior to the procedure, a History and Physical                            was performed, and patient medications and                            allergies were reviewed. The patient's tolerance of                            previous anesthesia was also reviewed. The risks                            and benefits of the procedure and the sedation                            options and risks were discussed with the patient.                            All questions were answered, and informed consent                            was obtained. Prior Anticoagulants: The patient has                            taken no anticoagulant or antiplatelet agents. ASA                            Grade Assessment: II - A patient with mild systemic                            disease. After reviewing the risks and benefits,                            the patient was deemed in satisfactory condition to                            undergo the procedure.  After obtaining informed consent, the colonoscope                            was passed under direct vision. Throughout the                            procedure, the patient's blood pressure, pulse, and                            oxygen saturations were monitored continuously. The                            Olympus Scope [id number] was introduced through the                            anus and advanced to the 4 cm into the ileum. The                             colonoscopy was performed without difficulty. The                            patient tolerated the procedure well. The quality                            of the bowel preparation was good. The terminal                            ileum, ileocecal valve, appendiceal orifice, and                            rectum were photographed. Scope In: 10:37:16 AM Scope Out: 10:52:22 AM Scope Withdrawal Time: 0 hours 12 minutes 31 seconds  Total Procedure Duration: 0 hours 15 minutes 6 seconds  Findings:                 The colon (entire examined portion) appeared                            normal. Biopsies for histology were taken with a                            cold forceps for evaluation of microscopic colitis.                           The terminal ileum appeared normal. Biopsies were                            taken with a cold forceps for histology.                           Non-bleeding internal hemorrhoids were found during                            retroflexion. The hemorrhoids were small and Grade  I (internal hemorrhoids that do not prolapse). A                            small healing posterior anal fissure was noted on                            rectal exam                           The exam was otherwise without abnormality on                            direct and retroflexion views. Complications:            No immediate complications. Estimated Blood Loss:     Estimated blood loss: none. Impression:               - Small internal hemorrhoids.                           - Otherwise normal colonoscopy to TI. Recommendation:           - Patient has a contact number available for                            emergencies. The signs and symptoms of potential                            delayed complications were discussed with the                            patient. Return to normal activities tomorrow.                            Written  discharge instructions were provided to the                            patient.                           - Resume previous diet.                           - Continue present medications.                           - Await pathology results.                           - Trial of Bentyl 10 mg p.o. twice daily #60. 6 RF                           - Repeat colonoscopy at age 76 for screening                            purposes as per current recommendations. Earlier,  if with any new problems.                           - FU if still with problems.                           - The findings and recommendations were discussed                            with the patient's family. Lynann Bologna, MD 12/13/2022 11:11:01 AM This report has been signed electronically.

## 2022-12-13 NOTE — Progress Notes (Signed)
Called to room to assist during endoscopic procedure.  Patient ID and intended procedure confirmed with present staff. Received instructions for my participation in the procedure from the performing physician.  

## 2022-12-16 ENCOUNTER — Telehealth: Payer: Self-pay | Admitting: *Deleted

## 2022-12-16 NOTE — Telephone Encounter (Signed)
 Attempted to call patient for their post-procedure follow-up call. No answer. Left voicemail.   

## 2022-12-17 ENCOUNTER — Ambulatory Visit: Payer: Medicaid Other | Admitting: Internal Medicine

## 2022-12-18 ENCOUNTER — Ambulatory Visit: Payer: Medicaid Other | Admitting: Internal Medicine

## 2022-12-18 ENCOUNTER — Encounter: Payer: Self-pay | Admitting: Internal Medicine

## 2022-12-18 ENCOUNTER — Other Ambulatory Visit: Payer: Self-pay

## 2022-12-18 VITALS — BP 122/74 | HR 60 | Temp 99.2°F | Wt 249.0 lb

## 2022-12-18 DIAGNOSIS — J3089 Other allergic rhinitis: Secondary | ICD-10-CM | POA: Diagnosis not present

## 2022-12-18 DIAGNOSIS — J4530 Mild persistent asthma, uncomplicated: Secondary | ICD-10-CM

## 2022-12-18 DIAGNOSIS — T781XXD Other adverse food reactions, not elsewhere classified, subsequent encounter: Secondary | ICD-10-CM | POA: Diagnosis not present

## 2022-12-18 DIAGNOSIS — F17298 Nicotine dependence, other tobacco product, with other nicotine-induced disorders: Secondary | ICD-10-CM

## 2022-12-18 MED ORDER — NICOTINE 14 MG/24HR TD PT24: MEDICATED_PATCH | TRANSDERMAL | 0 refills | Status: DC

## 2022-12-18 MED ORDER — ALBUTEROL SULFATE HFA 108 (90 BASE) MCG/ACT IN AERS: 2.0000 | INHALATION_SPRAY | Freq: Four times a day (QID) | RESPIRATORY_TRACT | 1 refills | Status: DC | PRN

## 2022-12-18 MED ORDER — EPINEPHRINE 0.3 MG/0.3ML IJ SOAJ: 0.3000 mg | INTRAMUSCULAR | 1 refills | Status: DC | PRN

## 2022-12-18 MED ORDER — AZELASTINE HCL 0.1 % NA SOLN: 1.0000 | Freq: Two times a day (BID) | NASAL | 5 refills | Status: DC

## 2022-12-18 MED ORDER — MONTELUKAST SODIUM 10 MG PO TABS: 10.0000 mg | ORAL_TABLET | Freq: Every day | ORAL | 1 refills | Status: DC

## 2022-12-18 MED ORDER — FLUTICASONE-SALMETEROL 115-21 MCG/ACT IN AERO: 2.0000 | INHALATION_SPRAY | Freq: Two times a day (BID) | RESPIRATORY_TRACT | 5 refills | Status: DC

## 2022-12-18 MED ORDER — LEVOCETIRIZINE DIHYDROCHLORIDE 5 MG PO TABS: 5.0000 mg | ORAL_TABLET | Freq: Every evening | ORAL | 1 refills | Status: DC

## 2022-12-18 MED ORDER — FLUTICASONE PROPIONATE 50 MCG/ACT NA SUSP: 2.0000 | Freq: Every day | NASAL | 5 refills | Status: DC

## 2022-12-18 MED ORDER — NICOTINE 7 MG/24HR TD PT24: MEDICATED_PATCH | TRANSDERMAL | 0 refills | Status: DC

## 2022-12-18 NOTE — Patient Instructions (Addendum)
Allergic Rhinitis - Positive skin test 2022: cockroach and dust mite - Avoidance measures discussed. - Use nasal saline rinses before nose sprays such as with Neilmed Sinus Rinse.  Use distilled water.   - Use Flonase 2 sprays each nostril daily. Aim upward and outward. - Use Azelastine 1-2 sprays each nostril twice daily as needed. Aim upward and outward. - Use Xyzal 5mg  daily.  - Use Singulair 10mg  daily. Stop if there are any mood or behavioral changes. - For eyes, use Olopatadine or Ketotifen 1 eye drop daily as needed for itchy, watery eyes.  Available over the counter, if not covered by insurance.  - Consider allergy shots as long term control of your symptoms by teaching your immune system to be more tolerant of your allergy triggers. Of note, she is on propranolol for HTN.      Mild Persistent Asthma: - Maintenance inhaler: continue Advair HFA 115-58mcg 2 puffs twice daily with spacer and Singulair 10mg  daily.  - Rescue inhaler: Albuterol 2 puffs via spacer or 1 vial via nebulizer every 4-6 hours as needed for respiratory symptoms of cough, shortness of breath, or wheezing Asthma control goals:  Full participation in all desired activities (may need albuterol before activity) Albuterol use two times or less a week on average (not counting use with activity) Cough interfering with sleep two times or less a month Oral steroids no more than once a year No hospitalizations  Nicotine Use Disorder- Vaping - Use nicotine patch 14mg /day for 6 weeks and then 7mg /day for 2 weeks.    Positive Food Testing - please strictly avoid shellfish now.  Can consider in office challenge if interested in reintroduction.  - for SKIN only reaction, okay to take Benadryl 25mg  capsules every 6 hours - for SKIN + ANY additional symptoms, OR IF concern for LIFE THREATENING reaction = Epipen Autoinjector EpiPen 0.3 mg. - If using Epinephrine autoinjector, call 911

## 2022-12-18 NOTE — Progress Notes (Signed)
FOLLOW UP Date of Service/Encounter:  12/18/22   Subjective:  Allison Chandler (DOB: Mar 13, 1991) is a 32 y.o. female who [facility] for follow up for mild persistent asthma, allergic rhinoconjunctivitis and food allergies.   History obtained from: chart review and patient. Last visit was with me on 08/27/2022; at the time, we discussed vaping cessation and using spacer due to some asthma symptoms; on Advair 115-21 and Singulair. Normal spirometry. Also started on Azelastine for uncontrolled rhinitis.  Shrimp IgE at last visit was positive but has been avoiding it for a long time; can consider high risk food challenge.   Asthma: Repots having some trouble with this.  Has a lot of prolonged coughing fits and sometimes chest tightness.  Albuterol use about once a week.  On Advair HFA BID but forgets to take it several times a week.  Also is still vaping but wants to quit. Has been successful with nicotine patches in the past.  Has not gotten the spacer yet.   Allergies: Some itching when outdoors but otherwise denies much issues with congestion, drainage, runny nose, sneezing, itchy watery eyes.   Using Flonase, Azelastine, Singulair daily and PRN Xyzal.  Rarely needs allergy eye drops.   Food Allergies: Still avoiding shellfish.  No interest in reintroduction. Has an Epipen    Past Medical History: Past Medical History:  Diagnosis Date   ADHD (attention deficit hyperactivity disorder)    ADHD   Amenorrhea    Anemia    only when pregnant   Angio-edema    Anxiety    Asthma    Back pain, chronic    Bipolar 1 disorder (HCC)    Chronic back pain    COVID    mild case - Summer 2023   Depression    Heart murmur    as an infant   History of kidney stones    History of pelvic inflammatory disease    HTN (hypertension)    Hypothyroidism    PCOD (polycystic ovarian disease)    Pre-diabetes    Sleep apnea    uses a cpap    Objective:   BP 122/74   Pulse 60   Temp 99.2 F (37.3 C) (Temporal)   Wt 249 lb (112.9 kg)   LMP 11/15/2022   SpO2 98%   BMI 44.11 kg/m  Body mass index is 44.11 kg/m. Physical Exam: GEN: alert, well developed HEENT: clear conjunctiva, TM grey and translucent, nose with + inferior turbinate hypertrophy, pink nasal mucosa, clear rhinorrhea, no cobblestoning HEART: regular rate and rhythm, no murmur LUNGS: clear to auscultation bilaterally, no coughing, unlabored respiration SKIN: no rashes or lesions  Spirometry:  Tracings reviewed. Her effort: It was hard to get consistent efforts and there is a question as to whether this reflects a maximal maneuver. FVC: 2.98L FEV1: 2.26L, 73% predicted FEV1/FVC ratio: 76% Interpretation: Spirometry consistent with normal pattern.  Please see scanned spirometry results for details.  Assessment:   1. Perennial allergic rhinitis   2. Mild persistent asthma without complication   3. Other adverse food reactions, not elsewhere classified, subsequent encounter   4. Other tobacco product nicotine dependence with other nicotine-induced disorder     Plan/Recommendations:  Allergic Rhinitis - Controlled on combination of medical therapy as below.  - Positive skin test 2022: cockroach and dust mite - Avoidance measures discussed. - Use nasal saline rinses before nose sprays such as with Neilmed Sinus Rinse.  Use distilled water.   -  Use Flonase 2 sprays each nostril daily. Aim upward and outward. - Use Azelastine 1-2 sprays each nostril twice daily as needed. Aim upward and outward. - Use Xyzal 5mg  daily.  - Use Singulair 10mg  daily. Stop if there are any mood or behavioral changes. - For eyes, use Olopatadine or Ketotifen 1 eye drop daily as needed for itchy, watery eyes.  Available over the counter, if not covered by insurance.  - Consider allergy shots as long term control of your symptoms by teaching your immune system to be more tolerant of your  allergy triggers. Of note, she is on propranolol for HTN.      Mild Persistent Asthma: - Uncontrolled symptomatically, we discussed vaping is a huge contributor to coughing and also to get a spacer since her technique with the inhaler is not great.  Also discussed ways to remember to take it BID since compliance has been an issue.  - Spirometry today was normal.  - Maintenance inhaler: continue Advair HFA 115-21mcg 2 puffs twice daily with spacer and Singulair 10mg  daily.  Get this off amaz[employer].  - Rescue inhaler: Albuterol 2 puffs via spacer or 1 vial via nebulizer every 4-6 hours as needed for respiratory symptoms of cough, shortness of breath, or wheezing Asthma control goals:  Full participation in all desired activities (may need albuterol before activity) Albuterol use two times or less a week on average (not counting use with activity) Cough interfering with sleep two times or less a month Oral steroids no more than once a year No hospitalizations  Nicotine Use Disorder- Vaping - Use nicotine patch 14mg /day for 6 weeks and then 7mg /day for 2 weeks.   Patient provided counseling on vaping/nicotine cessation today. Discussed the various impacts smoking has on health such as increased infections, lung disease, malignancy, heart disease, stroke etc. Patient is alert and competent.   Willingness to quit: ready to quit Methods/skills for cessation: wants to try patches  Medication management for smoking: nicotine patches  Resources provided include information about quitting, benefits, and medications  Quit date: plan to have stopped by next visit in 3 months.  Will discuss further at next follow up visit. Spent over 3 minutes for tobacco cessation counseling.      Positive Food Testing - Avoids shellfish due to positive testing. Never had a reaction before.  - SPT 03/29/2021 with questionable reaction to oyster, other shellfish/fish/treenuts/peanut/soy/milk/casein were negative.   - sIgE  08/2022: positive to shrimp  - please strictly avoid shellfish now.  Can consider in office challenge if interested in reintroduction.  - for SKIN only reaction, okay to take Benadryl 25mg  capsules every 6 hours - for SKIN + ANY additional symptoms, OR IF concern for LIFE THREATENING reaction = Epipen Autoinjector EpiPen 0.3 mg. - If using Epinephrine autoinjector, call 911       Return in about 3 months (around 03/20/2023).  Alesia Morin, MD [facility]

## 2022-12-19 ENCOUNTER — Encounter: Payer: Self-pay | Admitting: Gastroenterology

## 2022-12-26 ENCOUNTER — Encounter: Payer: Self-pay | Admitting: Gastroenterology

## 2022-12-31 NOTE — Telephone Encounter (Signed)
Can take nonsteroidals but minimize it Also please take it with food Let us know if it causes any stomach problems RG

## 2023-01-07 ENCOUNTER — Other Ambulatory Visit: Payer: Self-pay

## 2023-01-07 ENCOUNTER — Other Ambulatory Visit: Payer: Self-pay | Admitting: Gastroenterology

## 2023-01-07 DIAGNOSIS — R197 Diarrhea, unspecified: Secondary | ICD-10-CM

## 2023-01-07 MED ORDER — DICYCLOMINE HCL 10 MG PO CAPS
10.0000 mg | ORAL_CAPSULE | Freq: Two times a day (BID) | ORAL | 0 refills | Status: AC
Start: 2023-01-07 — End: ?

## 2023-02-17 ENCOUNTER — Encounter: Payer: Self-pay | Admitting: Gastroenterology

## 2023-02-19 ENCOUNTER — Telehealth: Payer: Self-pay | Admitting: Gastroenterology

## 2023-02-19 NOTE — Telephone Encounter (Signed)
Left message for pt to call back   Pt had sent a my chart message in several days ago. Unable to reach pt.

## 2023-02-19 NOTE — Telephone Encounter (Signed)
Inbound call from patient returning phone call. Patient requesting a call back. Please advise, thank you.

## 2023-02-20 NOTE — Telephone Encounter (Signed)
Left message for patient to call back. Also, messaged patient through mychart to her original message.

## 2023-02-21 NOTE — Telephone Encounter (Signed)
Unable to reach patient x 3. Left voicemail for patient to call back.

## 2023-03-02 ENCOUNTER — Other Ambulatory Visit: Payer: Self-pay | Admitting: Gastroenterology

## 2023-03-02 DIAGNOSIS — R197 Diarrhea, unspecified: Secondary | ICD-10-CM

## 2023-03-03 NOTE — Telephone Encounter (Signed)
Spoke with pt in regard to my chart message that was sent. Pt stated that she is going to pick up her refill for the Bentyl today. Pt was recommended to try Imodium for the diarrhea and if her symptoms persist to give our office a call back.  Pt verbalized understanding with all questions answered.

## 2023-03-19 ENCOUNTER — Telehealth: Payer: Self-pay

## 2023-03-19 ENCOUNTER — Other Ambulatory Visit (HOSPITAL_COMMUNITY): Payer: Self-pay

## 2023-03-19 NOTE — Telephone Encounter (Signed)
Pharmacy Patient Advocate Encounter   Received notification from CoverMyMeds that prior authorization for Fluticasone-Salmeterol 115-21MCG/ACT aerosol is required/requested.   Insurance verification completed.   The patient is insured through [facility] .   Per test claim: PA required; PA submitted to [facility] via CoverMyMeds Key/confirmation #/EOC [id number] Status is pending

## 2023-03-20 MED ORDER — ADVAIR HFA 115-21 MCG/ACT IN AERO: 2.0000 | INHALATION_SPRAY | Freq: Two times a day (BID) | RESPIRATORY_TRACT | 5 refills | Status: DC

## 2023-03-20 NOTE — Telephone Encounter (Signed)
Pharmacy Patient Advocate Encounter  Received notification from [facility] that Prior Authorization for  Fluticasone-Salmeterol 115-21MCG/ACT aerosol has been DENIED.  Full denial letter will be uploaded to the media tab. See denial reason below.  We did not see certain details about your use and treatment. We may consider approval of this drug after a trial of certain other drugs first (a trial and failure of two formulary preferred drugs, such as Advair Diskus, Advair HFA Inhaler, Dulera Inhaler, and Symbicort Inhaler). We did not see records that you tried and did not respond well to two of these drugs first or that you cannot use them for certain reasons (such as a dug-drug interaction or adverse drug experience).   PA #/Case ID/Reference #: [id number]   Please be advised we currently do not have a Pharmacist to review denials, therefore you will need to process appeals accordingly as needed. Thanks for your support at this time. Contact for appeals are as follows: Phone: [phone], Fax: xxx

## 2023-03-20 NOTE — Telephone Encounter (Signed)
Sent in brand advair as it is preferred by insurance

## 2023-03-21 ENCOUNTER — Ambulatory Visit: Payer: Medicaid Other | Admitting: Internal Medicine

## 2023-04-02 ENCOUNTER — Ambulatory Visit: Payer: Medicaid Other | Admitting: Internal Medicine

## 2023-04-22 ENCOUNTER — Other Ambulatory Visit: Payer: Self-pay

## 2023-04-22 ENCOUNTER — Encounter: Payer: Self-pay | Admitting: Internal Medicine

## 2023-04-22 ENCOUNTER — Ambulatory Visit (INDEPENDENT_AMBULATORY_CARE_PROVIDER_SITE_OTHER): Payer: Medicaid Other | Admitting: Internal Medicine

## 2023-04-22 VITALS — BP 122/82 | HR 97 | Temp 98.3°F | Ht 63.0 in | Wt 247.2 lb

## 2023-04-22 DIAGNOSIS — J3089 Other allergic rhinitis: Secondary | ICD-10-CM

## 2023-04-22 DIAGNOSIS — J4530 Mild persistent asthma, uncomplicated: Secondary | ICD-10-CM | POA: Diagnosis not present

## 2023-04-22 DIAGNOSIS — T781XXD Other adverse food reactions, not elsewhere classified, subsequent encounter: Secondary | ICD-10-CM | POA: Diagnosis not present

## 2023-04-22 MED ORDER — ADVAIR HFA 115-21 MCG/ACT IN AERO: 2.0000 | INHALATION_SPRAY | Freq: Two times a day (BID) | RESPIRATORY_TRACT | 5 refills | Status: DC

## 2023-04-22 MED ORDER — ALBUTEROL SULFATE HFA 108 (90 BASE) MCG/ACT IN AERS: 1.0000 | INHALATION_SPRAY | Freq: Four times a day (QID) | RESPIRATORY_TRACT | 1 refills | Status: DC | PRN

## 2023-04-22 MED ORDER — LEVOCETIRIZINE DIHYDROCHLORIDE 5 MG PO TABS: 5.0000 mg | ORAL_TABLET | Freq: Two times a day (BID) | ORAL | 1 refills | Status: DC | PRN

## 2023-04-22 MED ORDER — MONTELUKAST SODIUM 10 MG PO TABS: 10.0000 mg | ORAL_TABLET | Freq: Every day | ORAL | 1 refills | Status: DC

## 2023-04-22 MED ORDER — EPINEPHRINE 0.3 MG/0.3ML IJ SOAJ: 0.3000 mg | INTRAMUSCULAR | 1 refills | Status: AC | PRN

## 2023-04-22 MED ORDER — FLUTICASONE PROPIONATE 50 MCG/ACT NA SUSP: 2.0000 | Freq: Every day | NASAL | 5 refills | Status: DC

## 2023-04-22 MED ORDER — AZELASTINE HCL 0.1 % NA SOLN: 1.0000 | Freq: Two times a day (BID) | NASAL | 5 refills | Status: AC

## 2023-04-22 NOTE — Progress Notes (Signed)
FOLLOW UP Date of Service/Encounter:  04/22/23   Subjective:  Allison Chandler (DOB: Jun 25, 1990) is a 32 y.o. female who [facility] for follow up for asthma, allergic rhinitis, food allergy.   History obtained from: chart review and patient. Last visit was on 12/18/2022 with me Asthma- discussed vaping cessation, given nicotine patches. On Advair and Singulair Rhinitis- Flonase/Zyrtec/Azelastine/olopatadine Food allergy- shellfish avoidance due to positive testing. Has an Epipen.  Asthma Asthma Control Test: ACT Total Score: 20.    Reports breathing is doing well as long as she takes her inhaler.  On Advair 2 puffs BID but sometimes forgets.  Taking Singulair daily.  Needing albuterol about 1-2x/week usually due to SOB with exertion or coughing.  Still vaping but has decreased use.  Did try the nicotine patches but not sure how helpful. No Er visits/oral prednisone since last visit.   Allergies Doing well currently.  Not much congestion, drainage, runny nose. Does get some itching at nighttime for which she uses PRN benadryl.  Also on Flonase, Azelastine and Zyrtec.  Food Allergy Avoiding shellfish. Not sure if interested in eating again. Has an Epipen. No accidental exposures.   Past Medical History: Past Medical History:  Diagnosis Date   ADHD (attention deficit hyperactivity disorder)    ADHD   Amenorrhea    Anemia    only when pregnant   Angio-edema    Anxiety    Asthma    Back pain, chronic    Bipolar 1 disorder (HCC)    Chronic back pain    COVID    mild case - Summer 2023   Depression    Heart murmur    as an infant   History of kidney stones    History of pelvic inflammatory disease    HTN (hypertension)    Hypothyroidism    PCOD (polycystic ovarian disease)    Pre-diabetes    Sleep apnea    uses a cpap    Objective:  BP 122/82   Pulse 97   Temp 98.3 F (36.8 C)   Ht 5\' 3"  (1.6 m)   Wt 247 lb 3.2 oz (112.1  kg)   SpO2 97%   BMI 43.79 kg/m  Body mass index is 43.79 kg/m. Physical Exam: GEN: alert, well developed HEENT: clear conjunctiva, nose with mild inferior turbinate hypertrophy, pink nasal mucosa, no rhinorrhea, no  cobblestoning HEART: regular rate and rhythm, no murmur LUNGS: clear to auscultation bilaterally, no coughing, unlabored respiration SKIN: no rashes or lesions  Spirometry:  Tracings reviewed. Her effort: Good reproducible efforts. FVC: 3.26L, 89% FEV1: 2.78L, 90% predicted FEV1/FVC ratio: 85% Interpretation: Spirometry consistent with normal pattern.  Please see scanned spirometry results for details.  Assessment:   1. Perennial allergic rhinitis   2. Other adverse food reactions, not elsewhere classified, subsequent encounter   3. Mild persistent asthma without complication     Plan/Recommendations:   Allergic Rhinitis - Controlled.  - Positive skin test 2022: cockroach and dust mite - Avoidance measures discussed. - Use nasal saline rinses before nose sprays such as with Neilmed Sinus Rinse.  Use distilled water.   - Use Flonase 2 sprays each nostril daily. Aim upward and outward. - Use Azelastine 1-2 sprays each nostril twice daily as needed. Aim upward and outward. - Use Xyzal 5mg  daily.  Okay to use a second dose if needed.  - Use Singulair 10mg  daily. Stop if there are any mood or behavioral changes. -  For eyes, use Olopatadine or Ketotifen 1 eye drop daily as needed for itchy, watery eyes.  Available over the counter, if not covered by insurance.  - Consider allergy shots as long term control of your symptoms by teaching your immune system to be more tolerant of your allergy triggers. Of note, she is on propranolol for HTN.    Mild Persistent Asthma: - Well controlled.  Spirometry today was normal.  - Maintenance inhaler: continue Advair HFA 115-74mcg 2 puffs twice daily with spacer and Singulair 10mg  daily.  - Rescue inhaler: Albuterol 2 puffs via  spacer or 1 vial via nebulizer every 4-6 hours as needed for respiratory symptoms of cough, shortness of breath, or wheezing Asthma control goals:  Full participation in all desired activities (may need albuterol before activity) Albuterol use two times or less a week on average (not counting use with activity) Cough interfering with sleep two times or less a month Oral steroids no more than once a year No hospitalizations  Nicotine Use Disorder- Vaping - Continue working on reducing your vaping use.    Positive Food Testing - Avoids shellfish due to positive testing. Never had a reaction before.  - SPT 03/29/2021 with questionable reaction to oyster, other shellfish/fish/treenuts/peanut/soy/milk/casein were negative.   - sIgE 08/2022: positive to shrimp  - please strictly avoid shellfish now.  Can consider in office challenge if interested in reintroduction.  - for SKIN only reaction, okay to take Benadryl 25mg  capsules every 6 hours - for SKIN + ANY additional symptoms, OR IF concern for LIFE THREATENING reaction = Epipen Autoinjector EpiPen 0.3 mg. - If using Epinephrine autoinjector, call 911       Return in about 6 months (around 10/20/2023).  Alesia Morin, MD [facility]

## 2023-04-22 NOTE — Addendum Note (Signed)
Addended by: Philipp Deputy on: 04/22/2023 06:46 PM   Modules accepted: Orders

## 2023-04-22 NOTE — Patient Instructions (Addendum)
Allergic Rhinitis - Positive skin test 2022: cockroach and dust mite - Avoidance measures discussed. - Use nasal saline rinses before nose sprays such as with Neilmed Sinus Rinse.  Use distilled water.   - Use Flonase 2 sprays each nostril daily. Aim upward and outward. - Use Azelastine 1-2 sprays each nostril twice daily as needed. Aim upward and outward. - Use Xyzal 5mg  daily.  Okay to use a second dose if needed.  - Use Singulair 10mg  daily. Stop if there are any mood or behavioral changes. - For eyes, use Olopatadine or Ketotifen 1 eye drop daily as needed for itchy, watery eyes.  Available over the counter, if not covered by insurance.  - Consider allergy shots as long term control of your symptoms by teaching your immune system to be more tolerant of your allergy triggers. Of note, she is on propranolol for HTN.    Mild Persistent Asthma: - Maintenance inhaler: continue Advair HFA 115-66mcg 2 puffs twice daily with spacer and Singulair 10mg  daily.  - Rescue inhaler: Albuterol 2 puffs via spacer or 1 vial via nebulizer every 4-6 hours as needed for respiratory symptoms of cough, shortness of breath, or wheezing Asthma control goals:  Full participation in all desired activities (may need albuterol before activity) Albuterol use two times or less a week on average (not counting use with activity) Cough interfering with sleep two times or less a month Oral steroids no more than once a year No hospitalizations  Nicotine Use Disorder- Vaping - Continue working on reducing your vaping use.    Positive Food Testing - please strictly avoid shellfish now.  If interested in reintroduction, we would have to do an in office challenge.  - for SKIN only reaction, okay to take Benadryl 25mg  capsules every 6 hours - for SKIN + ANY additional symptoms, OR IF concern for LIFE THREATENING reaction = Epipen Autoinjector EpiPen 0.3 mg. - If using Epinephrine autoinjector, call 911

## 2023-05-05 ENCOUNTER — Encounter: Payer: Self-pay | Admitting: Internal Medicine

## 2023-05-05 ENCOUNTER — Encounter: Payer: Self-pay | Admitting: Gastroenterology

## 2023-05-06 ENCOUNTER — Other Ambulatory Visit: Payer: Self-pay

## 2023-05-06 MED ORDER — LEVOCETIRIZINE DIHYDROCHLORIDE 5 MG PO TABS: 5.0000 mg | ORAL_TABLET | Freq: Two times a day (BID) | ORAL | 5 refills | Status: DC | PRN

## 2023-05-06 NOTE — Telephone Encounter (Signed)
Do you mind making her a disc

## 2023-05-12 ENCOUNTER — Ambulatory Visit: Payer: Medicaid Other

## 2023-05-12 ENCOUNTER — Ambulatory Visit (INDEPENDENT_AMBULATORY_CARE_PROVIDER_SITE_OTHER): Payer: Medicaid Other

## 2023-05-12 ENCOUNTER — Ambulatory Visit
Admission: EM | Admit: 2023-05-12 | Discharge: 2023-05-12 | Disposition: A | Discharge status: Home / Self Care | Payer: Medicaid Other | Attending: Family Medicine | Admitting: Family Medicine

## 2023-05-12 DIAGNOSIS — S97122A Crushing injury of left lesser toe(s), initial encounter: Secondary | ICD-10-CM | POA: Diagnosis not present

## 2023-05-12 DIAGNOSIS — S97129A Crushing injury of unspecified lesser toe(s), initial encounter: Secondary | ICD-10-CM

## 2023-05-12 MED ORDER — NAPROXEN 375 MG PO TABS: 375.0000 mg | ORAL_TABLET | Freq: Two times a day (BID) | ORAL | 0 refills | Status: DC

## 2023-05-12 NOTE — ED Provider Notes (Signed)
[facility]    CSN: [id number] Arrival date & time: 05/12/23  0820      History   Chief Complaint Chief Complaint  Patient presents with   Foot Injury    Left foot     HPI Allison Chandler is a 32 y.o. female.   HPI Patient is today with 3-day history of left foot pain.  Patient reports that she accidentally hit her left foot against a couch 3 days ago and has continued to have pain.  Patient is able to ambulate however endorses pain with weightbearing. She denies any bruising or notable swelling. Pain is most pronounced in the third and fourth toe.  No prior injuries involving the left foot.  She has not taken any medications for injury. Past Medical History:  Diagnosis Date   ADHD (attention deficit hyperactivity disorder)    ADHD   Amenorrhea    Anemia    only when pregnant   Angio-edema    Anxiety    Asthma    Back pain, chronic    Bipolar 1 disorder (HCC)    Chronic back pain    COVID    mild case - Summer 2023   Depression    Heart murmur    as an infant   History of kidney stones    History of pelvic inflammatory disease    HTN (hypertension)    Hypothyroidism    PCOD (polycystic ovarian disease)    Pre-diabetes    Sleep apnea    uses a cpap    Patient Active Problem List   Diagnosis Date Noted   Nausea and vomiting 08/06/2022   OSA (obstructive sleep apnea) 06/04/2022   Closed right ankle fracture 04/22/2022   Mild intermittent asthma without complication 10/31/2021   Other allergic rhinitis 03/29/2021   Allergic conjunctivitis of both eyes 03/29/2021   Shortness of breath 03/29/2021   Other adverse food reactions, not elsewhere classified, subsequent encounter 03/29/2021   Prediabetes 10/09/2020   Environmental allergies 08/31/2020   Essential hypertension 08/31/2020   RUQ abdominal pain 09/10/2019   Rectal bleeding 09/10/2019   Amenorrhea 04/29/2018   Hypothyroidism 04/29/2018   Pain in female genitalia on intercourse 04/29/2018    Magnetic resonance imaging of brain abnormal 04/24/2018   SIRS (systemic inflammatory response syndrome) (HCC) 04/09/2018   Bipolar disorder (HCC) 04/09/2018   ADD (attention deficit disorder) 04/09/2018   Upper respiratory tract infection 08/13/2017   Morbid obesity due to excess calories (HCC) 02/12/2016   High triglycerides 07/19/2015   Chronic back pain 04/21/2015   Chronic pelvic pain in female 04/21/2015   Nevus 04/21/2015   PCOS (polycystic ovarian syndrome) 04/21/2015    Past Surgical History:  Procedure Laterality Date   ORIF ANKLE FRACTURE Right 04/24/2022   Procedure: OPEN REDUCTION INTERNAL FIXATION (ORIF) RIGHT ANKLE FRACTURE;  Surgeon: Nadara Mustard, MD;  Location: [facility] OR;  Service: Orthopedics;  Laterality: Right;   WISDOM TOOTH EXTRACTION      OB History     Gravida  1   Para  1   Term  1   Preterm      AB      Living  1      SAB      IAB      Ectopic      Multiple      Live Births  1            Home Medications    Prior to Admission medications  Medication Sig Start Date End Date Taking? Authorizing Provider  naproxen (NAPROSYN) 375 MG tablet Take 1 tablet (375 mg total) by mouth 2 (two) times daily. 05/12/23  Yes Bing Neighbors, NP  acetaminophen (TYLENOL) 500 MG tablet Take 1,000 mg by mouth every 6 (six) hours as needed for moderate pain or headache. Every 6 to 8 hours as needed for pain    [provider]  ADVAIR HFA 115-21 MCG/ACT inhaler Inhale 2 puffs into the lungs 2 (two) times daily. 04/22/23   Birder Robson, MD  albuterol (VENTOLIN HFA) 108 (90 Base) MCG/ACT inhaler Inhale 1-2 puffs into the lungs every 6 (six) hours as needed for wheezing or shortness of breath. 04/22/23   Birder Robson, MD  amLODipine (NORVASC) 10 MG tablet Take 10 mg by mouth daily.    [provider]  amphetamine-dextroamphetamine (ADDERALL) 5 MG tablet Take 1 tablet by mouth 2 (two) times daily. 10/29/22   [provider]   azelastine (ASTELIN) 0.1 % nasal spray Place 1 spray into both nostrils 2 (two) times daily. Use in each nostril as directed 04/22/23   Birder Robson, MD  dicyclomine (BENTYL) 10 MG capsule TAKE 1 CAPSULE BY MOUTH 2 TIMES DAILY. 03/03/23   Lynann Bologna, MD  EPINEPHrine (EPIPEN 2-PAK) 0.3 mg/0.3 mL IJ SOAJ injection Inject 0.3 mg into the muscle as needed for anaphylaxis. 04/22/23   Birder Robson, MD  fluticasone (FLONASE) 50 MCG/ACT nasal spray Place 2 sprays into both nostrils daily. 04/22/23   Birder Robson, MD  Ketotifen Fumarate (ALLERGY EYE DROPS OP) Place 1 drop into both eyes daily as needed (allergies).    [provider]  Lactase (LACTAID FAST ACT) 9000 units CHEW Chew 9,000 Units by mouth as needed (consuming dairy).    [provider]  levocetirizine (XYZAL) 5 MG tablet Take 1 tablet (5 mg total) by mouth 2 (two) times daily as needed for allergies. 05/06/23   Birder Robson, MD  LORazepam (ATIVAN) 0.5 MG tablet Take 0.5 mg by mouth daily as needed for anxiety.    [provider]  montelukast (SINGULAIR) 10 MG tablet Take 1 tablet (10 mg total) by mouth daily. 04/22/23   Birder Robson, MD  ondansetron (ZOFRAN-ODT) 4 MG disintegrating tablet TAKE 1 TABLET (4 MG TOTAL) BY MOUTH EVERY 6 (SIX) HOURS AS NEEDED FOR NAUSEA OR VOMITING 01/08/23   Zehr, Shanda Bumps D, PA-C  pantoprazole (PROTONIX) 40 MG tablet Take 1 tablet (40 mg total) by mouth daily. 12/13/22   Lynann Bologna, MD  prazosin (MINIPRESS) 2 MG capsule Take 2 mg by mouth daily.    [provider]  propranolol (INDERAL) 40 MG tablet Take 40 mg by mouth 2 (two) times daily. 12/14/21   [provider]  Spacer/Aero-Holding Deretha Emory DEVI 1 Device by Does not apply route in the morning and at bedtime. 08/27/22   Birder Robson, MD  tiZANidine (ZANAFLEX) 4 MG tablet Take 6 mg by mouth every 8 (eight) hours as needed for muscle spasms. 08/26/19   [provider]  valACYclovir (VALTREX) 500 MG  tablet Take 500 mg by mouth daily. 11/16/21   [provider]  VRAYLAR 4.5 MG CAPS Take 1 capsule by mouth every evening.  09/07/19   [provider]    Family History Family History  Problem Relation Age of Onset   Hypertension Mother    Ulcerative colitis Mother    Diabetes Mother    Heart disease Mother  Hyperlipidemia Mother    Colon cancer Neg Hx    Colon polyps Neg Hx    Esophageal cancer Neg Hx    Rectal cancer Neg Hx    Stomach cancer Neg Hx     Social History Social History   Tobacco Use   Smoking status: Former    Current packs/day: 1.00    Types: Cigarettes   Smokeless tobacco: Current  Vaping Use   Vaping status: Every Day   Substances: Nicotine, Flavoring  Substance Use Topics   Alcohol use: Yes    Comment: occasionally   Drug use: No     Allergies   Other and Wellbutrin [bupropion]   Review of Systems Review of Systems Pertinent negatives listed in HPI   Physical Exam Triage Vital Signs ED Triage Vitals  Encounter Vitals Group     BP 05/12/23 0910 125/85     Systolic BP Percentile --      Diastolic BP Percentile --      Pulse Rate 05/12/23 0910 72     Resp 05/12/23 0910 18     Temp 05/12/23 0910 97.9 F (36.6 C)     Temp Source 05/12/23 0910 Oral     SpO2 05/12/23 0910 98 %     Weight 05/12/23 0908 245 lb (111.1 kg)     Height 05/12/23 0908 5\' 3"  (1.6 m)     Head Circumference --      Peak Flow --      Pain Score 05/12/23 0908 4     Pain Loc --      Pain Education --      Exclude from Growth Chart --    No data found.  Updated Vital Signs BP 125/85 (BP Location: Left Arm)   Pulse 72   Temp 97.9 F (36.6 C) (Oral)   Resp 18   Ht 5\' 3"  (1.6 m)   Wt 245 lb (111.1 kg)   LMP 04/27/2023 (Exact Date)   SpO2 98%   BMI 43.40 kg/m   Visual Acuity Right Eye Distance:   Left Eye Distance:   Bilateral Distance:    Right Eye Near:   Left Eye Near:    Bilateral Near:     Physical Exam Vitals reviewed.   Constitutional:      Appearance: Normal appearance.  HENT:     Head: Normocephalic and atraumatic.     Nose: Nose normal.  Eyes:     Extraocular Movements: Extraocular movements intact.     Conjunctiva/sclera: Conjunctivae normal.     Pupils: Pupils are equal, round, and reactive to light.  Cardiovascular:     Rate and Rhythm: Normal rate and regular rhythm.  Pulmonary:     Effort: Pulmonary effort is normal.     Breath sounds: Normal breath sounds.  Musculoskeletal:     Left foot: Decreased range of motion. Bony tenderness present. No swelling.  Skin:    General: Skin is warm and dry.     Capillary Refill: Capillary refill takes less than 2 seconds.  Neurological:     General: No focal deficit present.     Mental Status: She is alert.      UC Treatments / Results  Labs (all labs ordered are listed, but only abnormal results are displayed) Labs Reviewed - No data to display  EKG   Radiology No results found.  Procedures Procedures (including critical care time)  Medications Ordered in UC Medications - No data to display  Initial Impression / Assessment  and Plan / UC Course  I have reviewed the triage vital signs and the nursing notes.  Pertinent labs & imaging results that were available during my care of the patient were reviewed by me and considered in my medical decision making (see chart for details).    Images reviewed by this writer, on initial review no fracture or deformity noted.  Radiologist to provide final read.  Discussed with patient to continue buddy taping third and fourth toe and NSAID therapy twice daily for 5 days.  Suspect crush injury causing a sprain type injury to both toes.  Advised to return if symptoms worsen or do not improve with treatment.  Patient medically cleared to return to work as long as toes remain buddy taped may discontinue buddy taping once pain has completely resolved.  Patient verbalized understanding and agreement with  plan.    Final Clinical Impressions(s) / UC Diagnoses   Final diagnoses:  Crushing injury of fourth toe  Crushing injury of third toe of left foot, initial encounter     Discharge Instructions      Imaging of 3rd and 4th toe does not show any obvious fracture.  Images have been sent to radiology if anything seemed different compared to what I have reviewed on images we will notify you via MyChart.  Current treatment anti-inflammatories, prescribing naproxen twice daily for 5 days and continue with buddy tape of both toes for immobilization to promote healing suspect is a sprain injury.      ED Prescriptions     Medication Sig Dispense Auth. Provider   naproxen (NAPROSYN) 375 MG tablet Take 1 tablet (375 mg total) by mouth 2 (two) times daily. 20 tablet Bing Neighbors, NP      PDMP not reviewed this encounter.   Bing Neighbors, NP 05/12/23 [phone]

## 2023-05-12 NOTE — Discharge Instructions (Addendum)
Imaging of 3rd and 4th toe does not show any obvious fracture.  Images have been sent to radiology if anything seemed different compared to what I have reviewed on images we will notify you via MyChart.  Current treatment anti-inflammatories, prescribing naproxen twice daily for 5 days and continue with buddy tape of both toes for immobilization to promote healing suspect is a sprain injury.

## 2023-05-12 NOTE — ED Triage Notes (Signed)
Patient presents with left foot pain, states she hit it against the couch 3 days ago.

## 2023-05-22 NOTE — Telephone Encounter (Signed)
Dicyclomine was added 10 mg p.o. twice daily after the colonoscopy. If still with diarrhea, increase dicyclomine 10 mg p.o. 3 times daily.  Please call in [phone] If twice a day is working well, please continue twice a day.  RG

## 2023-06-11 ENCOUNTER — Other Ambulatory Visit: Payer: Self-pay | Admitting: Gastroenterology

## 2023-06-11 DIAGNOSIS — R197 Diarrhea, unspecified: Secondary | ICD-10-CM

## 2023-06-21 ENCOUNTER — Other Ambulatory Visit: Payer: Self-pay | Admitting: Gastroenterology

## 2023-07-04 NOTE — Progress Notes (Deleted)
 06/04/22- 31 yoF Smoker (1 ppd) for sleep evaluation courtesy of Misty Stanley, PA-C, with concern of OSA on CPAP Medical problem list includes- HTN, Asthma, Allergic Rhinitis, Hypothyroid, PCOS, Obesity, Depression/ BiPolar,   [facility] Kathryne Sharper [phone]/19- results pending to media tab CPAP auto 7-14/ [facility] Epworth score- Body weight today-234 lbs Covid vax- Flu vax- - Vyvanse,  Has used CPAP routinely. Needs to establish for supplies. Sleeps much better with CPAP- no more daytime tiredness and fiance reports she is not snoring. No ENT surgery. Asthma managed by Allergist. Born with mild heart murmur. Denies pregnant. Current machine is original, new in 2021. CXR 12/13/21- IMPRESSION: No active disease.  07/07/23- 32 yoF Smoker (1 ppd) for sleep evaluation courtesy of Misty Stanley, PA-C, with concern of OSA on CPAP Medical problem list includes- HTN, Asthma, Allergic Rhinitis, Hypothyroid, PCOS, Obesity, Depression/ BiPolar,   [facility] Kathryne Sharper [phone]/21/19- results pending to media tab CPAP auto 7-14/ [facility] Epworth score- Body weight today-    ROS-see HPI   + = positive Constitutional:    weight loss, night sweats, fevers, chills, fatigue, lassitude. HEENT:    headaches, difficulty swallowing, tooth/dental problems, sore throat,       sneezing, itching, ear ache, nasal congestion, post nasal drip, snoring CV:    chest pain, orthopnea, PND, swelling in lower extremities, anasarca,                                   dizziness, palpitations Resp:   shortness of breath with exertion or at rest.                productive cough,   non-productive cough, coughing up of blood.              change in color of mucus.  wheezing.   Skin:    rash or lesions. GI:  No-   heartburn, indigestion, abdominal pain, nausea, vomiting, diarrhea,                 change in bowel habits, loss of appetite GU: dysuria, change in color of  urine, no urgency or frequency.   flank pain. MS:   joint pain, stiffness, decreased range of motion, back pain. Neuro-     nothing unusual Psych:  change in mood or affect.  depression or anxiety.   memory loss.  OBJ- Physical Exam General- Alert, Oriented, Affect-appropriate, Distress- none acute, + obese Skin- rash-none, lesions- none, excoriation- none Lymphadenopathy- none Head- atraumatic            Eyes- Gross vision intact, PERRLA, conjunctivae and secretions clear            Ears- Hearing, canals-normal            Nose- Clear, no-Septal dev, mucus, polyps, erosion, perforation             Throat- Mallampati III , mucosa clear , drainage- none, tonsils+, teeth+.  Neck- flexible , trachea midline, no stridor , thyroid nl, carotid no bruit Chest - symmetrical excursion , unlabored           Heart/CV- RRR , no murmur , no gallop  , no rub, nl s1 s2                           - JVD- none , edema- none, stasis changes- none,  varices- none           Lung- clear to P&A, wheeze- none, cough- none , dullness-none, rub- none           Chest wall-  Abd-  Br/ Gen/ Rectal- Not done, not indicated Extrem- +R foot in ortho boot/ cane Neuro- grossly intact to observation

## 2023-07-07 ENCOUNTER — Ambulatory Visit: Payer: Medicaid Other | Admitting: Internal Medicine

## 2023-07-07 ENCOUNTER — Encounter: Payer: Self-pay | Admitting: Internal Medicine

## 2023-07-07 DIAGNOSIS — G4733 Obstructive sleep apnea (adult) (pediatric): Secondary | ICD-10-CM

## 2023-07-07 NOTE — Telephone Encounter (Signed)
Please advise on mychart msg from this pt:  Hello I am not feeling well and attempted to call the office but had a very long wait. Is there anyway to refill my CPAP supplies without a visit?

## 2023-07-07 NOTE — Telephone Encounter (Signed)
Renewal order for CPAP auto 5-15 and supplies sent to [facility].  Please have patient reschedule for office follow-up of OSA

## 2023-07-15 ENCOUNTER — Encounter: Payer: Self-pay | Admitting: Gastroenterology

## 2023-07-22 ENCOUNTER — Other Ambulatory Visit (INDEPENDENT_AMBULATORY_CARE_PROVIDER_SITE_OTHER): Payer: Self-pay

## 2023-07-22 ENCOUNTER — Ambulatory Visit (INDEPENDENT_AMBULATORY_CARE_PROVIDER_SITE_OTHER): Payer: Medicaid Other | Admitting: Orthopedic Surgery

## 2023-07-22 ENCOUNTER — Encounter: Payer: Self-pay | Admitting: Orthopedic Surgery

## 2023-07-22 DIAGNOSIS — M25571 Pain in right ankle and joints of right foot: Secondary | ICD-10-CM | POA: Diagnosis not present

## 2023-07-22 DIAGNOSIS — T85848A Pain due to other internal prosthetic devices, implants and grafts, initial encounter: Secondary | ICD-10-CM

## 2023-07-22 NOTE — Progress Notes (Signed)
Office Visit Note   Patient: Allison Chandler           Date of Birth: 1991-02-28           MRN: [id number] Visit Date: 07/22/2023              Requested by: Bryon Lions, PA-C [address] [place],  [place] [address] PCP: Bryon Lions, PA-C  Chief Complaint  Patient presents with   Right Ankle - Pain    HX ORIF 04/24/2022      HPI: Patient is a 33 year old woman who is status post open reduction internal fixation Weber B fibular fracture November 2023.  Patient states she has developed acute pain directly over the hardware.  She is concerned that something is broken or loosened.  Assessment & Plan: Visit Diagnoses:  1. Pain in right ankle and joints of right foot   2. Pain from implanted hardware, initial encounter     Plan: With the symptomatic painful hardware patient states she would like to have the hardware removed.  Risk and benefits were discussed including infection nonhealing of the incision and need for additional surgery.  Patient states she understands wished to proceed at this time.  Follow-Up Instructions: Return if symptoms worsen or fail to improve.   Ortho Exam  Patient is alert, oriented, no adenopathy, well-dressed, normal affect, normal respiratory effort. Examination patient has a good dorsalis pedis pulse.  She has no tenderness to palpation over the Achilles or peroneal tendons.  The ankle is stable with full range of motion.  She is directly tender to palpation over the deep retained hardware.  Imaging: XR Ankle Complete Right Result Date: 07/22/2023 Three-view radiographs of the right ankle shows a congruent mortise.  The fibula is out to length the hardware is in place without failure and without backing out of the screws.  No images are attached to the encounter.  Labs: Lab Results  Component Value Date   REPTSTATUS 11/20/2019 FINAL 11/18/2019   GRAMSTAIN  04/09/2018    NO WBC SEEN NO ORGANISMS SEEN Gram Stain  Report Called to,Read Back By and Verified With: [facility] @ 1325 ON [id number] BY POTEAT,S Performed at [facility], [address] [place], [place] [address]    CULT MULTIPLE SPECIES PRESENT, SUGGEST RECOLLECTION (A) 11/18/2019     Lab Results  Component Value Date   ALBUMIN 4.1 03/11/2020   ALBUMIN 4.0 11/18/2019   ALBUMIN 4.3 10/23/2019    No results found for: "MG" No results found for: "VD25OH"  No results found for: "PREALBUMIN"    Latest Ref Rng & Units 04/24/2022   12:55 PM 12/13/2021    9:56 AM 12/12/2021    2:35 PM  CBC EXTENDED  WBC 4.0 - 10.5 K/uL 11.7  9.9  10.9   RBC 3.87 - 5.11 MIL/uL 4.47  4.31  4.57   Hemoglobin 12.0 - 15.0 g/dL 65.7  84.6  96.2   HCT 36.0 - 46.0 % 40.4  37.2  39.4   Platelets 150 - 400 K/uL 311  284  304      There is no height or weight on file to calculate BMI.  Orders:  Orders Placed This Encounter  Procedures   XR Ankle Complete Right   No orders of the defined types were placed in this encounter.    Procedures: No procedures performed  Clinical Data: No additional findings.  ROS:  All other systems negative, except as noted in  the HPI. Review of Systems  Objective: Vital Signs: There were no vitals taken for this visit.  Specialty Comments:  No specialty comments available.  PMFS History: Patient Active Problem List   Diagnosis Date Noted   Nausea and vomiting 08/06/2022   OSA (obstructive sleep apnea) 06/04/2022   Closed right ankle fracture 04/22/2022   Mild intermittent asthma without complication 10/31/2021   Other allergic rhinitis 03/29/2021   Allergic conjunctivitis of both eyes 03/29/2021   Shortness of breath 03/29/2021   Other adverse food reactions, not elsewhere classified, subsequent encounter 03/29/2021   Prediabetes 10/09/2020   Environmental allergies 08/31/2020   Essential hypertension 08/31/2020   RUQ abdominal pain 09/10/2019   Rectal bleeding 09/10/2019   Amenorrhea  04/29/2018   Hypothyroidism 04/29/2018   Pain in female genitalia on intercourse 04/29/2018   Magnetic resonance imaging of brain abnormal 04/24/2018   SIRS (systemic inflammatory response syndrome) (HCC) 04/09/2018   Bipolar disorder (HCC) 04/09/2018   ADD (attention deficit disorder) 04/09/2018   Upper respiratory tract infection 08/13/2017   Morbid obesity due to excess calories (HCC) 02/12/2016   High triglycerides 07/19/2015   Chronic back pain 04/21/2015   Chronic pelvic pain in female 04/21/2015   Nevus 04/21/2015   PCOS (polycystic ovarian syndrome) 04/21/2015   Past Medical History:  Diagnosis Date   ADHD (attention deficit hyperactivity disorder)    ADHD   Amenorrhea    Anemia    only when pregnant   Angio-edema    Anxiety    Asthma    Back pain, chronic    Bipolar 1 disorder (HCC)    Chronic back pain    COVID    mild case - Summer 2023   Depression    Heart murmur    as an infant   History of kidney stones    History of pelvic inflammatory disease    HTN (hypertension)    Hypothyroidism    PCOD (polycystic ovarian disease)    Pre-diabetes    Sleep apnea    uses a cpap    Family History  Problem Relation Age of Onset   Hypertension Mother    Ulcerative colitis Mother    Diabetes Mother    Heart disease Mother    Hyperlipidemia Mother    Colon cancer Neg Hx    Colon polyps Neg Hx    Esophageal cancer Neg Hx    Rectal cancer Neg Hx    Stomach cancer Neg Hx     Past Surgical History:  Procedure Laterality Date   ORIF ANKLE FRACTURE Right 04/24/2022   Procedure: OPEN REDUCTION INTERNAL FIXATION (ORIF) RIGHT ANKLE FRACTURE;  Surgeon: Nadara Mustard, MD;  Location: [facility] OR;  Service: Orthopedics;  Laterality: Right;   WISDOM TOOTH EXTRACTION     Social History   Occupational History   Occupation: Metallurgist  Tobacco Use   Smoking status: Former    Current packs/day: 1.00    Types: Cigarettes   Smokeless tobacco: Current  Vaping Use   Vaping  status: Every Day   Substances: Nicotine, Flavoring  Substance and Sexual Activity   Alcohol use: Yes    Comment: occasionally   Drug use: No   Sexual activity: Yes    Partners: Male    Comment: partner has vasectomy

## 2023-07-22 NOTE — Telephone Encounter (Signed)
Do you mind calling for an appt with duda

## 2023-07-23 ENCOUNTER — Ambulatory Visit: Payer: Medicaid Other | Admitting: Family

## 2023-07-24 ENCOUNTER — Encounter: Payer: Self-pay | Admitting: Orthopedic Surgery

## 2023-08-05 ENCOUNTER — Other Ambulatory Visit: Payer: Self-pay | Admitting: Orthopedic Surgery

## 2023-08-07 ENCOUNTER — Other Ambulatory Visit: Payer: Self-pay

## 2023-08-07 ENCOUNTER — Encounter (HOSPITAL_COMMUNITY): Payer: Self-pay | Admitting: Orthopedic Surgery

## 2023-08-07 NOTE — Progress Notes (Addendum)
 SDW CALL  Patient was given pre-op instructions over the phone. The opportunity was given for the patient to ask questions. No further questions asked. Patient verbalized understanding of instructions given.   PCP - Charma Igo Cardiologist - denies  PPM/ICD - denies Device Orders -  Rep Notified -   Chest x-ray - 12/12/21 EKG - DOS Stress Test - denies ECHO - denies Cardiac Cath - denies  Sleep Study - 06/19/19 CPAP - yes  Fasting Blood Sugar - na Checks Blood Sugar _____ times a day  Blood Thinner Instructions:na Aspirin Instructions:na  ERAS Protcol -clears until 0430 PRE-SURGERY Ensure or G2- no  COVID TEST- na   Anesthesia review: yes - childhood murmur and echo; per annual physical exam with PCP 06/13/23, no murmurs.  Patient denies shortness of breath, fever, cough and chest pain over the phone call    Surgical Instructions    Your procedure is scheduled on March 7  Report to [facility] Main Entrance "A" at 0530 A.M., then check in with the Admitting office.  Call this number if you have problems the morning of surgery:  [phone]    Remember:  Do not eat after midnight the night before your surgery  You may drink clear liquids until 0430 the morning of your surgery.   Clear liquids allowed are: Water, Non-Citrus Juices (without pulp), Carbonated Beverages, Clear Tea, Black Coffee ONLY (NO MILK, CREAM OR POWDERED CREAMER of any kind), and Gatorade   Take these medicines the morning of surgery with A SIP OF WATER: Advair,Amlodipine,Astelin,Pristiq,Bentyl,Xyzal,Lorazepam ER,Protonix,Propranolol,Flonase. PRN- Tylenol,Albuterol-bring to the hospital, Zofran,Zanaflex.    As of today, STOP taking any Aspirin (unless otherwise instructed by your surgeon) Aleve, Naproxen, Ibuprofen, Motrin, Advil, Goody's, BC's, all herbal medications, fish oil, and all vitamins.  [facility] is not responsible for any belongings or valuables. .   Do NOT Smoke  (Tobacco/Vaping)  24 hours prior to your procedure  If you use a CPAP at night, you may bring your mask for your overnight stay.   Contacts, glasses, hearing aids, dentures or partials may not be worn into surgery, please bring cases for these belongings   Patients discharged the day of surgery will not be allowed to drive home, and someone needs to stay with them for 24 hours.    Special instructions:    Oral Hygiene is also important to reduce your risk of infection.  Remember - BRUSH YOUR TEETH THE MORNING OF SURGERY WITH YOUR REGULAR TOOTHPASTE   Day of Surgery:  Take a shower the day of or night before with antibacterial soap. Wear Clean/Comfortable clothing the morning of surgery Do not apply any deodorants/lotions.   Do not wear jewelry or makeup Do not wear lotions, powders, perfumes/colognes, or deodorant. Do not shave 48 hours prior to surgery.  Men may shave face and neck. Do not bring valuables to the hospital. Do not wear nail polish, gel polish, artificial nails, or any other type of covering on natural nails (fingers and toes) If you have artificial nails or gel coating that need to be removed by a nail salon, please have this removed prior to surgery. Artificial nails or gel coating may interfere with anesthesia's ability to adequately monitor your vital signs. Remember to brush your teeth WITH YOUR REGULAR TOOTHPASTE.

## 2023-08-08 ENCOUNTER — Other Ambulatory Visit: Payer: Self-pay

## 2023-08-08 ENCOUNTER — Ambulatory Visit (HOSPITAL_COMMUNITY)
Admission: RE | Admit: 2023-08-08 | Discharge: 2023-08-08 | Disposition: A | Discharge status: Home / Self Care | Payer: Medicaid Other | Source: Ambulatory Visit | Attending: Orthopedic Surgery | Admitting: Orthopedic Surgery

## 2023-08-08 ENCOUNTER — Encounter: Payer: Self-pay | Admitting: Orthopedic Surgery

## 2023-08-08 ENCOUNTER — Ambulatory Visit (HOSPITAL_COMMUNITY): Admitting: Anesthesiology

## 2023-08-08 ENCOUNTER — Ambulatory Visit (HOSPITAL_BASED_OUTPATIENT_CLINIC_OR_DEPARTMENT_OTHER): Admitting: Anesthesiology

## 2023-08-08 ENCOUNTER — Encounter (HOSPITAL_COMMUNITY)
Admission: RE | Disposition: A | Discharge status: Home / Self Care | Payer: Self-pay | Source: Ambulatory Visit | Attending: Orthopedic Surgery

## 2023-08-08 ENCOUNTER — Encounter (HOSPITAL_COMMUNITY): Payer: Self-pay | Admitting: Orthopedic Surgery

## 2023-08-08 ENCOUNTER — Telehealth: Payer: Self-pay | Admitting: Orthopedic Surgery

## 2023-08-08 DIAGNOSIS — T85848A Pain due to other internal prosthetic devices, implants and grafts, initial encounter: Secondary | ICD-10-CM

## 2023-08-08 DIAGNOSIS — Z87442 Personal history of urinary calculi: Secondary | ICD-10-CM | POA: Diagnosis not present

## 2023-08-08 DIAGNOSIS — Z7951 Long term (current) use of inhaled steroids: Secondary | ICD-10-CM | POA: Diagnosis not present

## 2023-08-08 DIAGNOSIS — I10 Essential (primary) hypertension: Secondary | ICD-10-CM | POA: Insufficient documentation

## 2023-08-08 DIAGNOSIS — F419 Anxiety disorder, unspecified: Secondary | ICD-10-CM | POA: Insufficient documentation

## 2023-08-08 DIAGNOSIS — T8484XA Pain due to internal orthopedic prosthetic devices, implants and grafts, initial encounter: Secondary | ICD-10-CM | POA: Insufficient documentation

## 2023-08-08 DIAGNOSIS — D649 Anemia, unspecified: Secondary | ICD-10-CM | POA: Diagnosis not present

## 2023-08-08 DIAGNOSIS — R7303 Prediabetes: Secondary | ICD-10-CM | POA: Diagnosis not present

## 2023-08-08 DIAGNOSIS — Z8249 Family history of ischemic heart disease and other diseases of the circulatory system: Secondary | ICD-10-CM | POA: Diagnosis not present

## 2023-08-08 DIAGNOSIS — F909 Attention-deficit hyperactivity disorder, unspecified type: Secondary | ICD-10-CM | POA: Diagnosis not present

## 2023-08-08 DIAGNOSIS — E669 Obesity, unspecified: Secondary | ICD-10-CM | POA: Diagnosis not present

## 2023-08-08 DIAGNOSIS — I498 Other specified cardiac arrhythmias: Secondary | ICD-10-CM | POA: Diagnosis not present

## 2023-08-08 DIAGNOSIS — J45909 Unspecified asthma, uncomplicated: Secondary | ICD-10-CM

## 2023-08-08 DIAGNOSIS — G4730 Sleep apnea, unspecified: Secondary | ICD-10-CM | POA: Insufficient documentation

## 2023-08-08 DIAGNOSIS — Z6841 Body Mass Index (BMI) 40.0 and over, adult: Secondary | ICD-10-CM | POA: Insufficient documentation

## 2023-08-08 DIAGNOSIS — Z791 Long term (current) use of non-steroidal anti-inflammatories (NSAID): Secondary | ICD-10-CM | POA: Insufficient documentation

## 2023-08-08 DIAGNOSIS — Z87891 Personal history of nicotine dependence: Secondary | ICD-10-CM | POA: Insufficient documentation

## 2023-08-08 DIAGNOSIS — G8929 Other chronic pain: Secondary | ICD-10-CM | POA: Insufficient documentation

## 2023-08-08 DIAGNOSIS — E039 Hypothyroidism, unspecified: Secondary | ICD-10-CM | POA: Diagnosis not present

## 2023-08-08 DIAGNOSIS — R0602 Shortness of breath: Secondary | ICD-10-CM | POA: Diagnosis not present

## 2023-08-08 DIAGNOSIS — F319 Bipolar disorder, unspecified: Secondary | ICD-10-CM | POA: Insufficient documentation

## 2023-08-08 DIAGNOSIS — Z79899 Other long term (current) drug therapy: Secondary | ICD-10-CM | POA: Insufficient documentation

## 2023-08-08 LAB — BASIC METABOLIC PANEL
Anion gap: 11 (ref 5–15)
BUN: 11 mg/dL (ref 6–20)
CO2: 25 mmol/L (ref 22–32)
Calcium: 8.9 mg/dL (ref 8.9–10.3)
Chloride: 105 mmol/L (ref 98–111)
Creatinine, Ser: 0.78 mg/dL (ref 0.44–1.00)
GFR, Estimated: >60 mL/min (ref >60)
Glucose, Bld: 136 mg/dL — ABNORMAL HIGH (ref 70–99)
Potassium: 3.7 mmol/L (ref 3.5–5.1)
Sodium: 141 mmol/L (ref 135–145)

## 2023-08-08 LAB — POCT PREGNANCY, URINE: Preg Test, Ur: NEGATIVE

## 2023-08-08 LAB — SURGICAL PCR SCREEN
MRSA, PCR: NEGATIVE
Staphylococcus aureus: NEGATIVE

## 2023-08-08 LAB — CBC
HCT: 35.8 % — ABNORMAL LOW (ref 36.0–46.0)
Hemoglobin: 11.8 g/dL — ABNORMAL LOW (ref 12.0–15.0)
MCH: 28.5 pg (ref 26.0–34.0)
MCHC: 33 g/dL (ref 30.0–36.0)
MCV: 86.5 fL (ref 80.0–100.0)
Platelets: 274 10*3/uL (ref 150–400)
RBC: 4.14 MIL/uL (ref 3.87–5.11)
RDW: 13.3 % (ref 11.5–15.5)
WBC: 9.2 10*3/uL (ref 4.0–10.5)
nRBC: 0 % (ref 0.0–0.2)

## 2023-08-08 SURGERY — REMOVAL, HARDWARE
Anesthesia: General | Laterality: Right

## 2023-08-08 MED ORDER — LACTATED RINGERS IV SOLN: INTRAVENOUS | Status: DC | PRN

## 2023-08-08 MED ORDER — FENTANYL CITRATE (PF) 100 MCG/2ML IJ SOLN
INTRAMUSCULAR | Status: AC
  Filled 2023-08-08: qty 2

## 2023-08-08 MED ORDER — OXYCODONE HCL 5 MG PO TABS: 5.0000 mg | ORAL_TABLET | Freq: Once | ORAL | Status: DC | PRN

## 2023-08-08 MED ORDER — PROPOFOL 10 MG/ML IV BOLUS
INTRAVENOUS | Status: DC | PRN
  Administered 2023-08-08: 200 mg via INTRAVENOUS

## 2023-08-08 MED ORDER — DEXMEDETOMIDINE HCL IN NACL 80 MCG/20ML IV SOLN
INTRAVENOUS | Status: DC | PRN
  Administered 2023-08-08: 10 ug via INTRAVENOUS
  Administered 2023-08-08: 15 ug via INTRAVENOUS

## 2023-08-08 MED ORDER — FENTANYL CITRATE (PF) 250 MCG/5ML IJ SOLN
INTRAMUSCULAR | Status: AC
  Filled 2023-08-08: qty 5

## 2023-08-08 MED ORDER — OXYCODONE-ACETAMINOPHEN 5-325 MG PO TABS: 1.0000 | ORAL_TABLET | ORAL | 0 refills | Status: DC | PRN

## 2023-08-08 MED ORDER — ORAL CARE MOUTH RINSE: 15.0000 mL | Freq: Once | OROMUCOSAL | Status: AC

## 2023-08-08 MED ORDER — MIDAZOLAM HCL 2 MG/2ML IJ SOLN
INTRAMUSCULAR | Status: AC
Start: 2023-08-08 — End: ?
  Filled 2023-08-08: qty 2

## 2023-08-08 MED ORDER — FENTANYL CITRATE (PF) 250 MCG/5ML IJ SOLN
INTRAMUSCULAR | Status: DC | PRN
  Administered 2023-08-08: 100 ug via INTRAVENOUS
  Administered 2023-08-08 (×3): 50 ug via INTRAVENOUS

## 2023-08-08 MED ORDER — AMISULPRIDE (ANTIEMETIC) 5 MG/2ML IV SOLN: 10.0000 mg | Freq: Once | INTRAVENOUS | Status: DC | PRN

## 2023-08-08 MED ORDER — VASOPRESSIN 20 UNIT/ML IV SOLN
INTRAVENOUS | Status: AC
  Filled 2023-08-08: qty 1

## 2023-08-08 MED ORDER — ONDANSETRON HCL 4 MG/2ML IJ SOLN
INTRAMUSCULAR | Status: DC | PRN
  Administered 2023-08-08: 4 mg via INTRAVENOUS

## 2023-08-08 MED ORDER — CEFAZOLIN SODIUM-DEXTROSE 2-4 GM/100ML-% IV SOLN
2.0000 g | INTRAVENOUS | Status: AC
  Administered 2023-08-08: 2 g via INTRAVENOUS
  Filled 2023-08-08: qty 100

## 2023-08-08 MED ORDER — OXYCODONE HCL 5 MG/5ML PO SOLN: 5.0000 mg | Freq: Once | ORAL | Status: DC | PRN

## 2023-08-08 MED ORDER — ALBUTEROL SULFATE HFA 108 (90 BASE) MCG/ACT IN AERS
INHALATION_SPRAY | RESPIRATORY_TRACT | Status: DC | PRN
  Administered 2023-08-08: 2 via RESPIRATORY_TRACT

## 2023-08-08 MED ORDER — DEXAMETHASONE SODIUM PHOSPHATE 10 MG/ML IJ SOLN
INTRAMUSCULAR | Status: DC | PRN
  Administered 2023-08-08: 10 mg via INTRAVENOUS

## 2023-08-08 MED ORDER — CHLORHEXIDINE GLUCONATE 0.12 % MT SOLN: 15.0000 mL | Freq: Once | OROMUCOSAL | Status: AC

## 2023-08-08 MED ORDER — ACETAMINOPHEN 500 MG PO TABS
1000.0000 mg | ORAL_TABLET | Freq: Once | ORAL | Status: AC
  Administered 2023-08-08: 1000 mg via ORAL
  Filled 2023-08-08: qty 2

## 2023-08-08 MED ORDER — MIDAZOLAM HCL 2 MG/2ML IJ SOLN
INTRAMUSCULAR | Status: DC | PRN
  Administered 2023-08-08: 2 mg via INTRAVENOUS

## 2023-08-08 MED ORDER — 0.9 % SODIUM CHLORIDE (POUR BTL) OPTIME
TOPICAL | Status: DC | PRN
  Administered 2023-08-08: 1000 mL

## 2023-08-08 MED ORDER — CHLORHEXIDINE GLUCONATE 0.12 % MT SOLN
OROMUCOSAL | Status: AC
  Administered 2023-08-08: 15 mL via OROMUCOSAL
  Filled 2023-08-08: qty 15

## 2023-08-08 MED ORDER — LIDOCAINE 2% (20 MG/ML) 5 ML SYRINGE
INTRAMUSCULAR | Status: DC | PRN
Start: 2023-08-08 — End: 2023-08-08
  Administered 2023-08-08: 60 mg via INTRAVENOUS

## 2023-08-08 MED ORDER — FENTANYL CITRATE (PF) 100 MCG/2ML IJ SOLN
25.0000 ug | INTRAMUSCULAR | Status: DC | PRN
  Administered 2023-08-08 (×2): 50 ug via INTRAVENOUS

## 2023-08-08 MED ORDER — GLYCOPYRROLATE PF 0.2 MG/ML IJ SOSY
PREFILLED_SYRINGE | INTRAMUSCULAR | Status: DC | PRN
  Administered 2023-08-08: .2 mg via INTRAVENOUS

## 2023-08-08 MED ORDER — VASHE WOUND IRRIGATION OPTIME
TOPICAL | Status: DC | PRN
  Administered 2023-08-08: 34 [oz_av]

## 2023-08-08 SURGICAL SUPPLY — 39 items
BAG COUNTER SPONGE SURGICOUNT (BAG) ×1 IMPLANT
BANDAGE ESMARK 6X9 LF (GAUZE/BANDAGES/DRESSINGS) IMPLANT
BNDG COHESIVE 3X5 TAN ST LF (GAUZE/BANDAGES/DRESSINGS) IMPLANT
BNDG COHESIVE 4X5 TAN STRL LF (GAUZE/BANDAGES/DRESSINGS) IMPLANT
BNDG ESMARK 6X9 LF (GAUZE/BANDAGES/DRESSINGS) IMPLANT
BNDG GAUZE DERMACEA FLUFF 4 (GAUZE/BANDAGES/DRESSINGS) ×1 IMPLANT
CLEANSER WND VASHE 34 (WOUND CARE) IMPLANT
COVER SURGICAL LIGHT HANDLE (MISCELLANEOUS) ×2 IMPLANT
CUFF TOURN SGL QUICK 42 (TOURNIQUET CUFF) IMPLANT
CUFF TRNQT CYL 34X4.125X (TOURNIQUET CUFF) IMPLANT
DRAPE C-ARM 42X72 X-RAY (DRAPES) IMPLANT
DRAPE INCISE IOBAN 66X45 STRL (DRAPES) IMPLANT
DRAPE SURG ORHT 6 SPLT 77X108 (DRAPES) IMPLANT
DRAPE U-SHAPE 47X51 STRL (DRAPES) ×1 IMPLANT
DRSG EMULSION OIL 3X3 NADH (GAUZE/BANDAGES/DRESSINGS) ×1 IMPLANT
DURAPREP 26ML APPLICATOR (WOUND CARE) ×1 IMPLANT
ELECT REM PT RETURN 9FT ADLT (ELECTROSURGICAL) ×1 IMPLANT
ELECTRODE REM PT RTRN 9FT ADLT (ELECTROSURGICAL) ×1 IMPLANT
GAUZE PAD ABD 8X10 STRL (GAUZE/BANDAGES/DRESSINGS) ×1 IMPLANT
GAUZE SPONGE 4X4 12PLY STRL (GAUZE/BANDAGES/DRESSINGS) ×1 IMPLANT
GLOVE BIOGEL PI IND STRL 9 (GLOVE) ×1 IMPLANT
GLOVE SURG ORTHO 9.0 STRL STRW (GLOVE) ×1 IMPLANT
GOWN STRL REUS W/ TWL XL LVL3 (GOWN DISPOSABLE) ×3 IMPLANT
KIT BASIN OR (CUSTOM PROCEDURE TRAY) ×1 IMPLANT
KIT TURNOVER KIT B (KITS) ×1 IMPLANT
MANIFOLD NEPTUNE II (INSTRUMENTS) ×1 IMPLANT
NS IRRIG 1000ML POUR BTL (IV SOLUTION) ×1 IMPLANT
PACK ORTHO EXTREMITY (CUSTOM PROCEDURE TRAY) ×1 IMPLANT
PAD ARMBOARD 7.5X6 YLW CONV (MISCELLANEOUS) ×2 IMPLANT
SPONGE T-LAP 18X18 ~~LOC~~+RFID (SPONGE) IMPLANT
STAPLER VISISTAT 35W (STAPLE) IMPLANT
STOCKINETTE IMPERVIOUS 9X36 MD (GAUZE/BANDAGES/DRESSINGS) IMPLANT
SUT ETHILON 2 0 PSLX (SUTURE) IMPLANT
SUT VIC AB 0 CT1 27XBRD ANBCTR (SUTURE) IMPLANT
SUT VIC AB 2-0 CT1 TAPERPNT 27 (SUTURE) IMPLANT
TOWEL GREEN STERILE (TOWEL DISPOSABLE) ×1 IMPLANT
TOWEL GREEN STERILE FF (TOWEL DISPOSABLE) ×1 IMPLANT
UNDERPAD 30X36 HEAVY ABSORB (UNDERPADS AND DIAPERS) ×1 IMPLANT
WATER STERILE IRR 1000ML POUR (IV SOLUTION) ×1 IMPLANT

## 2023-08-08 NOTE — Op Note (Addendum)
 08/08/2023  8:05 AM  PATIENT:  Allison Chandler    PRE-OPERATIVE DIAGNOSIS:  Painful Deep Hardware  POST-OPERATIVE DIAGNOSIS:  Same  PROCEDURE:  REMOVAL DEEP HARDWARE RIGHT ANKLE  SURGEON:  Nadara Mustard, MD  PHYSICIAN ASSISTANT:None ANESTHESIA:   General  PREOPERATIVE INDICATIONS:  Allison Chandler is a  33 y.o. female with a diagnosis of Painful Deep Hardware who failed conservative measures and elected for surgical management.    The risks benefits and alternatives were discussed with the patient preoperatively including but not limited to the risks of infection, bleeding, nerve injury, cardiopulmonary complications, the need for revision surgery, among others, and the patient was willing to proceed.  OPERATIVE IMPLANTS:   Implant Name Type Inv. Item Serial No. Manufacturer Lot No. LRB No. Used Action  PLATE ACE [id number] 7HOLE - [id number] Plate PLATE ACE [id number] 7HOLE  [employer](ORTH,TRAU,BIO,SG)  Right 1 Explanted  SCREW CORTICAL 3.5MM  - [id number] Screw SCREW CORTICAL 3.5MM  [employer](ORTH,TRAU,BIO,SG)  Right 2 Explanted  SCREW CORTICAL 3.5MM  - [id number] Screw SCREW CORTICAL 3.5MM  [employer](ORTH,TRAU,BIO,SG)  Right 1 Explanted  SCREW CORTICAL 3.5MM  - [id number] Screw SCREW CORTICAL 3.5MM  [employer](ORTH,TRAU,BIO,SG)  Right 1 Explanted  SCREW CORTICAL 3.5MM - [id number] Screw SCREW CORTICAL 3.5MM  [employer](ORTH,TRAU,BIO,SG)  Right 1 Explanted    @ENCIMAGES @  OPERATIVE FINDINGS: Bone was well-healed.  No evidence of infection.  OPERATIVE PROCEDURE: Patient is brought the operating room underwent a general anesthetic.  After adequate levels anesthesia obtained patient's right lower extremity was prepped using DuraPrep draped into a sterile field a timeout was called.  The lateral incision was used carried down to the hardware.  A periosteal elevator was used to remove the fibrinous tissue off the plate.  The plate screws and  interfrag screw were removed without complication.  The bone was debrided back to healthy viable tissue.  The wound was irrigated with Vashe.  Incision closed using 2-0 nylon.  A sterile dressing was applied patient was extubated taken the PACU in stable condition.   DISCHARGE PLANNING:  Antibiotic duration: Preoperative antibiotics  Weightbearing: Weightbearing as tolerated  Pain medication: Prescription for Percocet  Dressing care/ Wound VAC: Dry dressing  Ambulatory devices: Crutches and walker  Discharge to: Home.  Follow-up: In the office 1 week post operative.

## 2023-08-08 NOTE — Telephone Encounter (Signed)
 Patient called. Would like a work note to be out put in her Clinical cytogeneticist.

## 2023-08-08 NOTE — H&P (Signed)
 Allison Chandler is an 33 y.o. female.   Chief Complaint: Painful deep retained hardware right ankle HPI: Patient is a 33 year old woman who is status post open reduction internal fixation Weber B fibular fracture November 2023. Patient states she has developed acute pain directly over the hardware. She is concerned that something is broken or loosened.   Past Medical History:  Diagnosis Date   ADHD (attention deficit hyperactivity disorder)    ADHD   Amenorrhea    Anemia    only when pregnant   Angio-edema    Anxiety    Asthma    Back pain, chronic    Bipolar 1 disorder (HCC)    Chronic back pain    COVID    mild case - Summer 2023   Depression    Heart murmur    as an infant   History of kidney stones    History of pelvic inflammatory disease    HTN (hypertension)    Hypothyroidism    PCOD (polycystic ovarian disease)    Pre-diabetes    Sleep apnea    uses a cpap    Past Surgical History:  Procedure Laterality Date   ORIF ANKLE FRACTURE Right 04/24/2022   Procedure: OPEN REDUCTION INTERNAL FIXATION (ORIF) RIGHT ANKLE FRACTURE;  Surgeon: Nadara Mustard, MD;  Location: [facility] OR;  Service: Orthopedics;  Laterality: Right;   WISDOM TOOTH EXTRACTION      Family History  Problem Relation Age of Onset   Hypertension Mother    Ulcerative colitis Mother    Diabetes Mother    Heart disease Mother    Hyperlipidemia Mother    Colon cancer Neg Hx    Colon polyps Neg Hx    Esophageal cancer Neg Hx    Rectal cancer Neg Hx    Stomach cancer Neg Hx    Social History:  reports that she has quit smoking. Her smoking use included cigarettes. She uses smokeless tobacco. She reports current alcohol use. She reports that she does not use drugs.  Allergies:  Allergies  Allergen Reactions   Other     Dairy - GI issues    Wellbutrin [Bupropion] Rash    Medications Prior to Admission  Medication Sig Dispense Refill   acetaminophen (TYLENOL) 500 MG tablet Take 1,000 mg by mouth every  6 (six) hours as needed for moderate pain or headache. Every 6 to 8 hours as needed for pain     ADVAIR HFA 115-21 MCG/ACT inhaler Inhale 2 puffs into the lungs 2 (two) times daily. 12 g 5   albuterol (VENTOLIN HFA) 108 (90 Base) MCG/ACT inhaler Inhale 1-2 puffs into the lungs every 6 (six) hours as needed for wheezing or shortness of breath. 8 g 1   amLODipine (NORVASC) 10 MG tablet Take 10 mg by mouth daily.     amphetamine-dextroamphetamine (ADDERALL) 10 MG tablet Take 10 mg by mouth 2 (two) times daily.     azelastine (ASTELIN) 0.1 % nasal spray Place 1 spray into both nostrils 2 (two) times daily. Use in each nostril as directed (Patient taking differently: Place 1 spray into both nostrils daily. Use in each nostril as directed) 30 mL 5   desvenlafaxine (PRISTIQ) 100 MG 24 hr tablet Take 100 mg by mouth daily.     dicyclomine (BENTYL) 10 MG capsule TAKE 1 CAPSULE BY MOUTH TWICE DAILY 60 capsule 2   EPINEPHrine (EPIPEN 2-PAK) 0.3 mg/0.3 mL IJ SOAJ injection Inject 0.3 mg into the muscle as needed for anaphylaxis.  2 each 1   fluticasone (FLONASE) 50 MCG/ACT nasal spray Place 2 sprays into both nostrils daily. (Patient taking differently: Place 1 spray into both nostrils daily.) 16 g 5   ibuprofen (ADVIL) 800 MG tablet TAKE 1 TABLET BY MOUTH THREE TIMES DAILY 60 tablet 3   Lactase (LACTAID FAST ACT) 9000 units CHEW Chew 9,000 Units by mouth as needed (consuming dairy).     levocetirizine (XYZAL) 5 MG tablet Take 1 tablet (5 mg total) by mouth 2 (two) times daily as needed for allergies. (Patient taking differently: Take 5 mg by mouth 2 (two) times daily.) 60 tablet 5   LORazepam ER (LOREEV XR) 1.5 MG CS24 Take 1.5 mg by mouth daily.     montelukast (SINGULAIR) 10 MG tablet Take 1 tablet (10 mg total) by mouth daily. 90 tablet 1   naphazoline-pheniramine (NAPHCON-A) 0.025-0.3 % ophthalmic solution Place 1 drop into both eyes 2 (two) times a week.     ondansetron (ZOFRAN) 4 MG tablet TAKE 1 TABLET BY  MOUTH EVERY 6 HOURS AS NEEDED FOR NAUSEA AND VOMITING 30 tablet 3   pantoprazole (PROTONIX) 40 MG tablet Take 1 tablet (40 mg total) by mouth daily. 90 tablet 3   prazosin (MINIPRESS) 1 MG capsule Take 1 mg by mouth See admin instructions. Take with 2 mg tab for a total of 3 mg at bedtime     prazosin (MINIPRESS) 2 MG capsule Take 2 mg by mouth daily.     propranolol (INDERAL) 40 MG tablet Take 40 mg by mouth 2 (two) times daily.     tiZANidine (ZANAFLEX) 4 MG tablet Take 6 mg by mouth every 8 (eight) hours as needed for muscle spasms.     valACYclovir (VALTREX) 500 MG tablet Take 500 mg by mouth at bedtime.     VRAYLAR 4.5 MG CAPS Take 4.5 mg by mouth every evening.     naproxen (NAPROSYN) 375 MG tablet Take 1 tablet (375 mg total) by mouth 2 (two) times daily. (Patient not taking: Reported on 08/05/2023) 20 tablet 0   ondansetron (ZOFRAN-ODT) 4 MG disintegrating tablet TAKE 1 TABLET (4 MG TOTAL) BY MOUTH EVERY 6 (SIX) HOURS AS NEEDED FOR NAUSEA OR VOMITING (Patient not taking: Reported on 08/05/2023) 30 tablet 1   Spacer/Aero-Holding Chambers DEVI 1 Device by Does not apply route in the morning and at bedtime. 1 each 1    Results for orders placed or performed during the hospital encounter of 08/08/23 (from the past 48 hours)  Pregnancy, urine POC     Status: None   Collection Time: 08/08/23  5:52 AM  Result Value Ref Range   Preg Test, Ur NEGATIVE NEGATIVE    Comment:        THE SENSITIVITY OF THIS METHODOLOGY IS >24 mIU/mL   Basic metabolic panel per protocol     Status: Abnormal   Collection Time: 08/08/23  5:54 AM  Result Value Ref Range   Sodium 141 135 - 145 mmol/L   Potassium 3.7 3.5 - 5.1 mmol/L   Chloride 105 98 - 111 mmol/L   CO2 25 22 - 32 mmol/L   Glucose, Bld 136 (H) 70 - 99 mg/dL    Comment: Glucose reference range applies only to samples taken after fasting for at least 8 hours.   BUN 11 6 - 20 mg/dL   Creatinine, Ser 4.25 0.44 - 1.00 mg/dL   Calcium 8.9 8.9 - 95.6 mg/dL    GFR, Estimated >38 >75 mL/min  Comment: (NOTE) Calculated using the CKD-EPI Creatinine Equation (2021)    Anion gap 11 5 - 15    Comment: Performed at [facility], [address] [place], [place] [address]  CBC per protocol     Status: Abnormal   Collection Time: 08/08/23  5:54 AM  Result Value Ref Range   WBC 9.2 4.0 - 10.5 K/uL   RBC 4.14 3.87 - 5.11 MIL/uL   Hemoglobin 11.8 (L) 12.0 - 15.0 g/dL   HCT 47.8 (L) 29.5 - 62.1 %   MCV 86.5 80.0 - 100.0 fL   MCH 28.5 26.0 - 34.0 pg   MCHC 33.0 30.0 - 36.0 g/dL   RDW 30.8 65.7 - 84.6 %   Platelets 274 150 - 400 K/uL   nRBC 0.0 0.0 - 0.2 %    Comment: Performed at [facility], [address] [place], [place] [address]   No results found.  Review of Systems  All other systems reviewed and are negative.   Blood pressure (!) 114/49, pulse 60, temperature 98.2 F (36.8 C), resp. rate 18, height 5\' 3"  (1.6 m), weight 111.1 kg, last menstrual period 07/21/2023, SpO2 98%. Physical Exam  Patient is alert, oriented, no adenopathy, well-dressed, normal affect, normal respiratory effort. Examination patient has a good dorsalis pedis pulse.  She has no tenderness to palpation over the Achilles or peroneal tendons.  The ankle is stable with full range of motion.  She is directly tender to palpation over the deep retained hardware.   Imaging: XR Ankle Complete Right Result Date: 07/22/2023 Three-view radiographs of the right ankle shows a congruent mortise.  The fibula is out to length the hardware is in place without failure and without backing out of the screws. Assessment/Plan 1. Pain in right ankle and joints of right foot   2. Pain from implanted hardware, initial encounter       Plan: With the symptomatic painful hardware patient states she would like to have the hardware removed.  Risk and benefits were discussed including infection nonhealing of the incision and need for additional surgery.  Patient states she  understands wished to proceed at this time.  Nadara Mustard, MD 08/08/2023, 6:43 AM

## 2023-08-08 NOTE — Anesthesia Procedure Notes (Signed)
 Procedure Name: LMA Insertion Date/Time: 08/08/2023 7:40 AM  Performed by: Herbie Drape, CRNAPre-anesthesia Checklist: Patient identified, Emergency Drugs available, Suction available, Timeout performed and Patient being monitored Patient Re-evaluated:Patient Re-evaluated prior to induction Oxygen Delivery Method: Circle system utilized Preoxygenation: Pre-oxygenation with 100% oxygen Induction Type: IV induction Ventilation: Mask ventilation without difficulty LMA: LMA inserted LMA Size: 4.0 Dental Injury: Teeth and Oropharynx as per pre-operative assessment  Comments: LMA #4 placed with ease - without leak

## 2023-08-08 NOTE — Progress Notes (Signed)
 Orthopedic Tech Progress Note Patient Details:  Allison Chandler 1990/09/23 [id number]  Ortho Devices Type of Ortho Device: Crutches, CAM walker Ortho Device/Splint Location: RLE Ortho Device/Splint Interventions: Ordered, Application, Adjustment   Post Interventions Patient Tolerated: Well Instructions Provided: Poper ambulation with device, Care of device, Adjustment of device  Tonye Pearson 08/08/2023, 9:54 AM

## 2023-08-08 NOTE — Telephone Encounter (Signed)
 Duplicate message.

## 2023-08-08 NOTE — Telephone Encounter (Signed)
 This has been done. Per Dr. Lajoyce Corners to be activities as tolerated in fx boot. Replied to pt's my chart message.

## 2023-08-08 NOTE — Anesthesia Preprocedure Evaluation (Signed)
 Anesthesia Evaluation  Patient identified by MRN, date of birth, ID band Patient awake    Reviewed: Allergy & Precautions, NPO status , Patient's Chart, lab work & pertinent test results  Airway Mallampati: III  TM Distance: >3 FB Neck ROM: Full    Dental  (+) Teeth Intact, Dental Advisory Given   Pulmonary shortness of breath and with exertion, asthma , sleep apnea , Patient abstained from smoking., former smoker   Pulmonary exam normal breath sounds clear to auscultation       Cardiovascular hypertension, Pt. on medications Normal cardiovascular exam+ Valvular Problems/Murmurs  Rhythm:Regular Rate:Normal     Neuro/Psych  PSYCHIATRIC DISORDERS Anxiety Depression Bipolar Disorder   negative neurological ROS     GI/Hepatic negative GI ROS, Neg liver ROS,,,  Endo/Other  Hypothyroidism  Class 3 obesityPre diabetes PCOS  Renal/GU Hx/o renal calculi  negative genitourinary   Musculoskeletal Fx right distal fibula   Abdominal  (+) + obese  Peds  Hematology  (+) Blood dyscrasia, anemia   Anesthesia Other Findings   Reproductive/Obstetrics                             Anesthesia Physical Anesthesia Plan  ASA: 3  Anesthesia Plan: General   Post-op Pain Management: Tylenol PO (pre-op)* and Toradol IV (intra-op)*   Induction:   PONV Risk Score and Plan: 3 and Treatment may vary due to age or medical condition, Ondansetron, Dexamethasone and Midazolam  Airway Management Planned: LMA  Additional Equipment: None  Intra-op Plan:   Post-operative Plan: Extubation in OR  Informed Consent: I have reviewed the patients History and Physical, chart, labs and discussed the procedure including the risks, benefits and alternatives for the proposed anesthesia with the patient or authorized representative who has indicated his/her understanding and acceptance.     Dental advisory given and Interpreter  used for interveiw  Plan Discussed with: CRNA and Anesthesiologist  Anesthesia Plan Comments: (  )       Anesthesia Quick Evaluation

## 2023-08-08 NOTE — Transfer of Care (Signed)
 Immediate Anesthesia Transfer of Care Note  Patient: Allison Chandler  Procedure(s) Performed: REMOVAL DEEP HARDWARE RIGHT ANKLE (Right)  Patient Location:   Anesthesia Type:General  Level of Consciousness: awake and alert   Airway & Oxygen Therapy: Patient Spontanous Breathing  Post-op Assessment: PACUReport given to RN  Post vital signs: Reviewed and stable  Last Vitals:  Vitals Value Taken Time  BP 138/79 08/08/23 0823  Temp 97.8   Pulse 82 08/08/23 0828  Resp 17 08/08/23 0828  SpO2 99 % 08/08/23 0828  Vitals shown include unfiled device data.  Last Pain:  Vitals:   08/08/23 0606  PainSc: 0-No pain      Patients Stated Pain Goal: 3 (08/08/23 0603)  Complications: No notable events documented.

## 2023-08-08 NOTE — Anesthesia Postprocedure Evaluation (Signed)
 Anesthesia Post Note  Patient: Meggie Ovando  Procedure(s) Performed: REMOVAL DEEP HARDWARE RIGHT ANKLE (Right)     Patient location during evaluation: PACU Anesthesia Type: General Level of consciousness: awake and alert Pain management: pain level controlled Vital Signs Assessment: post-procedure vital signs reviewed and stable Respiratory status: spontaneous breathing, nonlabored ventilation, respiratory function stable and patient connected to nasal cannula oxygen Cardiovascular status: blood pressure returned to baseline and stable Postop Assessment: no apparent nausea or vomiting Anesthetic complications: no  No notable events documented.  Last Vitals:  Vitals:   08/08/23 0900 08/08/23 0915  BP: (!) 125/92 122/71  Pulse: 95 81  Resp: 13 (!) 21  Temp:  37.2 C  SpO2: 98% 99%    Last Pain:  Vitals:   08/08/23 0900  PainSc: [address]

## 2023-08-11 ENCOUNTER — Encounter: Payer: Self-pay | Admitting: Orthopedic Surgery

## 2023-08-11 ENCOUNTER — Encounter (HOSPITAL_COMMUNITY): Payer: Self-pay | Admitting: Orthopedic Surgery

## 2023-08-14 ENCOUNTER — Other Ambulatory Visit (INDEPENDENT_AMBULATORY_CARE_PROVIDER_SITE_OTHER): Payer: Self-pay | Admitting: Orthopedic Surgery

## 2023-08-15 ENCOUNTER — Encounter: Payer: Self-pay | Admitting: Orthopedic Surgery

## 2023-08-18 ENCOUNTER — Ambulatory Visit (INDEPENDENT_AMBULATORY_CARE_PROVIDER_SITE_OTHER): Admitting: Orthopedic Surgery

## 2023-08-18 DIAGNOSIS — T85848A Pain due to other internal prosthetic devices, implants and grafts, initial encounter: Secondary | ICD-10-CM

## 2023-08-19 ENCOUNTER — Encounter: Payer: Self-pay | Admitting: Orthopedic Surgery

## 2023-08-19 NOTE — Progress Notes (Signed)
 Office Visit Note   Patient: Allison Chandler           Date of Birth: 1990/07/17           MRN: [id number] Visit Date: 08/18/2023              Requested by: Bryon Lions, PA-C [address] [place],  [place] [address] PCP: Bryon Lions, PA-C  Chief Complaint  Patient presents with   Right Ankle - Routine Post Op    08/08/2023 right ankle HDW removal       HPI: Patient is a 33 year old woman who presents in follow-up 10 days status post removal of deep retained hardware right ankle.  Assessment & Plan: Visit Diagnoses:  1. Pain from implanted hardware, initial encounter     Plan: Plan to follow-up in 1 week to remove the sutures.  She will then follow-up as needed.  Patient may be weightbearing as tolerated and recommended scar massage.  Follow-Up Instructions: Return in about 1 week (around 08/25/2023).   Ortho Exam  Patient is alert, oriented, no adenopathy, well-dressed, normal affect, normal respiratory effort. Examination incision is well-approximated there is no cellulitis or drainage.  Imaging: No results found. No images are attached to the encounter.  Labs: Lab Results  Component Value Date   REPTSTATUS 11/20/2019 FINAL 11/18/2019   GRAMSTAIN  04/09/2018    NO WBC SEEN NO ORGANISMS SEEN Gram Stain Report Called to,Read Back By and Verified With: [facility] @ 1325 ON [id number] BY POTEAT,S Performed at [facility], [address], [place], [place] [address]    CULT MULTIPLE SPECIES PRESENT, SUGGEST RECOLLECTION (A) 11/18/2019     Lab Results  Component Value Date   ALBUMIN 4.1 03/11/2020   ALBUMIN 4.0 11/18/2019   ALBUMIN 4.3 10/23/2019    No results found for: "MG" No results found for: "VD25OH"  No results found for: "PREALBUMIN"    Latest Ref Rng & Units 08/08/2023    5:54 AM 04/24/2022   12:55 PM 12/13/2021    9:56 AM  CBC EXTENDED  WBC 4.0 - 10.5 K/uL 9.2  11.7  9.9   RBC 3.87 - 5.11 MIL/uL 4.14   4.47  4.31   Hemoglobin 12.0 - 15.0 g/dL 64.4  03.4  74.2   HCT 36.0 - 46.0 % 35.8  40.4  37.2   Platelets 150 - 400 K/uL 274  311  284      There is no height or weight on file to calculate BMI.  Orders:  No orders of the defined types were placed in this encounter.  No orders of the defined types were placed in this encounter.    Procedures: No procedures performed  Clinical Data: No additional findings.  ROS:  All other systems negative, except as noted in the HPI. Review of Systems  Objective: Vital Signs: LMP 07/21/2023 (Exact Date)   Specialty Comments:  No specialty comments available.  PMFS History: Patient Active Problem List   Diagnosis Date Noted   Pain from implanted hardware 08/08/2023   Nausea and vomiting 08/06/2022   OSA (obstructive sleep apnea) 06/04/2022   Closed right ankle fracture 04/22/2022   Mild intermittent asthma without complication 10/31/2021   Other allergic rhinitis 03/29/2021   Allergic conjunctivitis of both eyes 03/29/2021   Shortness of breath 03/29/2021   Other adverse food reactions, not elsewhere classified, subsequent encounter 03/29/2021   Prediabetes 10/09/2020   Environmental allergies 08/31/2020   Essential hypertension 08/31/2020  RUQ abdominal pain 09/10/2019   Rectal bleeding 09/10/2019   Amenorrhea 04/29/2018   Hypothyroidism 04/29/2018   Pain in female genitalia on intercourse 04/29/2018   Magnetic resonance imaging of brain abnormal 04/24/2018   SIRS (systemic inflammatory response syndrome) (HCC) 04/09/2018   Bipolar disorder (HCC) 04/09/2018   ADD (attention deficit disorder) 04/09/2018   Upper respiratory tract infection 08/13/2017   Morbid obesity due to excess calories (HCC) 02/12/2016   High triglycerides 07/19/2015   Chronic back pain 04/21/2015   Chronic pelvic pain in female 04/21/2015   Nevus 04/21/2015   PCOS (polycystic ovarian syndrome) 04/21/2015   Past Medical History:  Diagnosis Date    ADHD (attention deficit hyperactivity disorder)    ADHD   Amenorrhea    Anemia    only when pregnant   Angio-edema    Anxiety    Asthma    Back pain, chronic    Bipolar 1 disorder (HCC)    Chronic back pain    COVID    mild case - Summer 2023   Depression    Heart murmur    as an infant   History of kidney stones    History of pelvic inflammatory disease    HTN (hypertension)    Hypothyroidism    PCOD (polycystic ovarian disease)    Pre-diabetes    Sleep apnea    uses a cpap    Family History  Problem Relation Age of Onset   Hypertension Mother    Ulcerative colitis Mother    Diabetes Mother    Heart disease Mother    Hyperlipidemia Mother    Colon cancer Neg Hx    Colon polyps Neg Hx    Esophageal cancer Neg Hx    Rectal cancer Neg Hx    Stomach cancer Neg Hx     Past Surgical History:  Procedure Laterality Date   HARDWARE REMOVAL Right 08/08/2023   Procedure: REMOVAL DEEP HARDWARE RIGHT ANKLE;  Surgeon: Nadara Mustard, MD;  Location: [facility] OR;  Service: Orthopedics;  Laterality: Right;   ORIF ANKLE FRACTURE Right 04/24/2022   Procedure: OPEN REDUCTION INTERNAL FIXATION (ORIF) RIGHT ANKLE FRACTURE;  Surgeon: Nadara Mustard, MD;  Location: [facility] OR;  Service: Orthopedics;  Laterality: Right;   WISDOM TOOTH EXTRACTION     Social History   Occupational History   Occupation: Metallurgist  Tobacco Use   Smoking status: Former    Current packs/day: 1.00    Types: Cigarettes   Smokeless tobacco: Current  Vaping Use   Vaping status: Every Day   Substances: Nicotine, Flavoring  Substance and Sexual Activity   Alcohol use: Yes    Comment: occasionally   Drug use: No   Sexual activity: Yes    Partners: Male    Comment: partner has vasectomy

## 2023-08-20 ENCOUNTER — Telehealth: Payer: Self-pay

## 2023-08-20 MED ORDER — OXYCODONE-ACETAMINOPHEN 5-325 MG PO TABS: 1.0000 | ORAL_TABLET | Freq: Three times a day (TID) | ORAL | 0 refills | Status: DC | PRN

## 2023-08-20 NOTE — Telephone Encounter (Signed)
 Trying to start a prior auth through [employer], cannot get logged into portal at the moment.

## 2023-08-20 NOTE — Telephone Encounter (Signed)
 Patient called stating that an authorization is needed for Percocet.  Cb# [id number].  Patient had right ankle surgery on 08/08/23.  Please advise.  Thank you.

## 2023-08-21 ENCOUNTER — Ambulatory Visit: Admitting: Orthopedic Surgery

## 2023-08-27 ENCOUNTER — Encounter: Payer: Self-pay | Admitting: Family

## 2023-08-27 ENCOUNTER — Ambulatory Visit (INDEPENDENT_AMBULATORY_CARE_PROVIDER_SITE_OTHER): Admitting: Family

## 2023-08-27 DIAGNOSIS — T85848A Pain due to other internal prosthetic devices, implants and grafts, initial encounter: Secondary | ICD-10-CM

## 2023-08-27 NOTE — Progress Notes (Signed)
 Post-Op Visit Note   Patient: Allison Chandler           Date of Birth: 18-Nov-1990           MRN: [id number] Visit Date: 08/27/2023 PCP: Bryon Lions, PA-C  Chief Complaint: No chief complaint on file.   HPI:  HPI The patient is a 33 year old woman seen status post deep retained hardware removal right ankle.  She reports she feels well is pleased with the results of the surgery now able to walk barefoot Ortho Exam On examination right ankle her lateral ankle incision is well-healed sutures harvested today without incident there is no gaping drainage or erythema  Visit Diagnoses: No diagnosis found.  Plan: Follow-up as needed  Follow-Up Instructions: No follow-ups on file.   Imaging: No results found.  Orders:  No orders of the defined types were placed in this encounter.  No orders of the defined types were placed in this encounter.    PMFS History: Patient Active Problem List   Diagnosis Date Noted   Pain from implanted hardware 08/08/2023   Nausea and vomiting 08/06/2022   OSA (obstructive sleep apnea) 06/04/2022   Closed right ankle fracture 04/22/2022   Mild intermittent asthma without complication 10/31/2021   Other allergic rhinitis 03/29/2021   Allergic conjunctivitis of both eyes 03/29/2021   Shortness of breath 03/29/2021   Other adverse food reactions, not elsewhere classified, subsequent encounter 03/29/2021   Prediabetes 10/09/2020   Environmental allergies 08/31/2020   Essential hypertension 08/31/2020   RUQ abdominal pain 09/10/2019   Rectal bleeding 09/10/2019   Amenorrhea 04/29/2018   Hypothyroidism 04/29/2018   Pain in female genitalia on intercourse 04/29/2018   Magnetic resonance imaging of brain abnormal 04/24/2018   SIRS (systemic inflammatory response syndrome) (HCC) 04/09/2018   Bipolar disorder (HCC) 04/09/2018   ADD (attention deficit disorder) 04/09/2018   Upper respiratory tract infection 08/13/2017   Morbid obesity due to  excess calories (HCC) 02/12/2016   High triglycerides 07/19/2015   Chronic back pain 04/21/2015   Chronic pelvic pain in female 04/21/2015   Nevus 04/21/2015   PCOS (polycystic ovarian syndrome) 04/21/2015   Past Medical History:  Diagnosis Date   ADHD (attention deficit hyperactivity disorder)    ADHD   Amenorrhea    Anemia    only when pregnant   Angio-edema    Anxiety    Asthma    Back pain, chronic    Bipolar 1 disorder (HCC)    Chronic back pain    COVID    mild case - Summer 2023   Depression    Heart murmur    as an infant   History of kidney stones    History of pelvic inflammatory disease    HTN (hypertension)    Hypothyroidism    PCOD (polycystic ovarian disease)    Pre-diabetes    Sleep apnea    uses a cpap    Family History  Problem Relation Age of Onset   Hypertension Mother    Ulcerative colitis Mother    Diabetes Mother    Heart disease Mother    Hyperlipidemia Mother    Colon cancer Neg Hx    Colon polyps Neg Hx    Esophageal cancer Neg Hx    Rectal cancer Neg Hx    Stomach cancer Neg Hx     Past Surgical History:  Procedure Laterality Date   HARDWARE REMOVAL Right 08/08/2023   Procedure: REMOVAL DEEP HARDWARE RIGHT ANKLE;  Surgeon: Aldean Baker  V, MD;  Location: [facility] OR;  Service: Orthopedics;  Laterality: Right;   ORIF ANKLE FRACTURE Right 04/24/2022   Procedure: OPEN REDUCTION INTERNAL FIXATION (ORIF) RIGHT ANKLE FRACTURE;  Surgeon: Nadara Mustard, MD;  Location: [facility] OR;  Service: Orthopedics;  Laterality: Right;   WISDOM TOOTH EXTRACTION     Social History   Occupational History   Occupation: Metallurgist  Tobacco Use   Smoking status: Former    Current packs/day: 1.00    Types: Cigarettes   Smokeless tobacco: Current  Vaping Use   Vaping status: Every Day   Substances: Nicotine, Flavoring  Substance and Sexual Activity   Alcohol use: Yes    Comment: occasionally   Drug use: No   Sexual activity: Yes    Partners: Male    Comment:  partner has vasectomy

## 2023-09-12 ENCOUNTER — Telehealth: Payer: Self-pay | Admitting: Gastroenterology

## 2023-09-12 NOTE — Telephone Encounter (Signed)
 Inbound call from patient requesting a call back to discuss if she needs a change in medication or a change in dosage. She states she has had a increase in her IBS. Please advise.

## 2023-09-12 NOTE — Telephone Encounter (Signed)
 Left message for patient to call back

## 2023-09-15 ENCOUNTER — Encounter: Payer: Self-pay | Admitting: Gastroenterology

## 2023-09-15 DIAGNOSIS — R197 Diarrhea, unspecified: Secondary | ICD-10-CM

## 2023-09-16 NOTE — Telephone Encounter (Signed)
 Refer back to pt message 4/14.

## 2023-09-23 MED ORDER — DICYCLOMINE HCL 10 MG PO CAPS: 10.0000 mg | ORAL_CAPSULE | Freq: Three times a day (TID) | ORAL | 2 refills | Status: DC

## 2023-09-23 NOTE — Addendum Note (Signed)
 Addended by: Leighla Chestnutt N on: 09/23/2023 09:12 AM   Modules accepted: Orders

## 2023-10-05 ENCOUNTER — Encounter: Payer: Self-pay | Admitting: Internal Medicine

## 2023-10-09 NOTE — Progress Notes (Deleted)
   [address] [place] [address] Dept: [phone]  FOLLOW UP NOTE  Patient ID: Allison Chandler, female    DOB: [birth year]  Age: 34 y.o. MRN: [id number] Date of Office Visit: 10/10/2023  Assessment  Chief Complaint: No chief complaint on file.  HPI Allison Chandler is a 33 year old female who presents to the clinic for follow-up visit.  She was last seen in this clinic on 04/22/2019 for by Dr. Lydia Sams for evaluation of asthma, allergic rhinitis, allergic conjunctivitis, nicotine  use, and food allergy to shellfish.  Her last environmental allergy skin testing was on 03/29/2021 was positive to cockroach and dust mite.  Her last food allergy testing was on 03/29/2021 and was equivocal to oyster.  Prior last food allergy testing via lab was on 08/27/2022 and was positive to shellfish.  Discussed the use of AI scribe software for clinical note transcription with the patient, who gave verbal consent to proceed.  History of Present Illness      Drug Allergies:  Allergies  Allergen Reactions   Other     Dairy - GI issues    Wellbutrin [Bupropion] Rash    Physical Exam: There were no vitals taken for this visit.   Physical Exam  Diagnostics:    Assessment and Plan: No diagnosis found.  No orders of the defined types were placed in this encounter.   There are no Patient Instructions on file for this visit.  No follow-ups on file.    Thank you for the opportunity to care for this patient.  Please do not hesitate to contact me with questions.  Marinus Sic, FNP [facility]

## 2023-10-10 ENCOUNTER — Ambulatory Visit: Admitting: Family Medicine

## 2023-10-12 ENCOUNTER — Encounter: Payer: Self-pay | Admitting: Gastroenterology

## 2023-10-13 ENCOUNTER — Other Ambulatory Visit: Payer: Self-pay

## 2023-10-13 ENCOUNTER — Telehealth: Payer: Self-pay

## 2023-10-13 MED ORDER — LEVOCETIRIZINE DIHYDROCHLORIDE 5 MG PO TABS: 5.0000 mg | ORAL_TABLET | Freq: Two times a day (BID) | ORAL | 2 refills | Status: AC

## 2023-10-13 MED ORDER — PANTOPRAZOLE SODIUM 40 MG PO TBEC: 40.0000 mg | DELAYED_RELEASE_TABLET | Freq: Every day | ORAL | 3 refills | Status: AC

## 2023-10-13 NOTE — Telephone Encounter (Signed)
 Spoke to patient advise refill was sent to [facility].

## 2023-10-23 NOTE — Progress Notes (Signed)
 [address] [place] [address] Dept: [phone]  FOLLOW UP NOTE  Patient ID: Issabela Ripple, female    DOB: 06/09/1990  Age: 33 y.o. MRN: [id number] Date of Office Visit: 10/24/2023  Assessment  Chief Complaint: Asthma (6 mth f/u - Okay), Allergic Rhinitis  (6 mth f/u - Pretty Good), and Food Allergy (6 mth f/u - Patient states she has been avoiding all food allergens)  HPI Angelic Rankin is a 33 year old female who presents to the clinic for a follow-up visit.  She was last seen in this clinic on 04/22/2019 for by Dr. Lydia Sams for evaluation of asthma, allergic rhinitis, allergic conjunctivitis, nicotine  use, and food allergy to shellfish.    At today's visit, she reports her asthma has been poorly controlled over the last 2 weeks with cough and shortness of breath occurring during sleep.  She also reports occasional shortness of breath with activity, and wheezing especially during high pollen counts.  She occasionally experiences dry cough occurring in the daytime and nighttime.  She continues Advair  115-2 puffs twice a day and uses albuterol  for relief of symptoms about 3 days a week.    Allergic rhinitis is reported as moderately well-controlled with symptoms including clear rhinorrhea and sneezing.  She continues Xyzol 5 mg once a day, Flonase , azelastine , and saline nasal rinses.  She reports frequent itching.  Over her body and especially in her ears over the last 2 weeks.  She reports that she has taken Xyzal  for greater than 1 year.  She has previously tried cetirizine  with relief of symptoms.  Her last environmental allergy skin testing was on 03/29/2021 was positive to cockroach and dust mite.  Her last food allergy testing was on 03/29/2021 and was equivocal to oyster.    She continues to avoid shellfish with no accidental ingestion or EpiPen  use since her last visit to this clinic.  Prior last food allergy testing via lab was on 08/27/2022 and was positive to shellfish.  EpiPen   set is up-to-date.  Her current medications are listed in the chart.   Drug Allergies:  Allergies  Allergen Reactions   Other     Dairy - GI issues    Wellbutrin [Bupropion] Rash    Physical Exam: BP 122/70 (BP Location: Right Arm, Patient Position: Sitting, Cuff Size: Normal)   Pulse 72   Temp 98.3 F (36.8 C) (Temporal)   Resp 16   Ht 5\' 3"  (1.6 m)   Wt 247 lb 8 oz (112.3 kg)   SpO2 97%   BMI 43.84 kg/m    Physical Exam Vitals reviewed.  Constitutional:      Appearance: Normal appearance.  HENT:     Head: Normocephalic and atraumatic.     Right Ear: Tympanic membrane normal.     Left Ear: Tympanic membrane normal.     Nose:     Comments: Bilateral nares slightly erythematous with thin clear nasal drainage noted pharynx normal.  Ears normal.  Eyes normal.    Mouth/Throat:     Pharynx: Oropharynx is clear.  Eyes:     Conjunctiva/sclera: Conjunctivae normal.  Cardiovascular:     Rate and Rhythm: Normal rate and regular rhythm.     Heart sounds: Normal heart sounds. No murmur heard. Pulmonary:     Effort: Pulmonary effort is normal.     Breath sounds: Normal breath sounds.     Comments: Lungs clear to auscultation Musculoskeletal:        General: Normal range of motion.  Cervical back: Normal range of motion and neck supple.  Skin:    General: Skin is warm and dry.  Neurological:     Mental Status: She is alert and oriented to person, place, and time.  Psychiatric:        Mood and Affect: Mood normal.        Behavior: Behavior normal.        Thought Content: Thought content normal.        Judgment: Judgment normal.     Diagnostics: FVC 3.22 which is 88% of predicted value, FEV1 2.61 which is 85% of predicted value.  Spirometry indicates normal ventilatory function.  Assessment and Plan: 1. Poorly controlled persistent asthma   2. Perennial allergic rhinitis   3. Seasonal allergic conjunctivitis   4. Other tobacco product nicotine  dependence with  nicotine -induced disorder   5. Allergy with anaphylaxis due to food     Meds ordered this encounter  Medications   ADVAIR  HFA 115-21 MCG/ACT inhaler    Sig: Inhale 2 puffs into the lungs 2 (two) times daily.    Dispense:  12 g    Refill:  5   albuterol  (VENTOLIN  HFA) 108 (90 Base) MCG/ACT inhaler    Sig: Inhale 1-2 puffs into the lungs every 6 (six) hours as needed for wheezing or shortness of breath.    Dispense:  8 g    Refill:  1    Patient Instructions  Asthma Begin Spiriva 1.25 mcg-2 puffs once a day to prevent cough or wheeze Continue Advair  115-2 puffs twice a day with a spacer to prevent cough or wheeze This continue albuterol  2 puffs once every 4 hours if needed for cough or wheeze You may use albuterol  2 puffs 5 to 15 minutes before activity to decrease cough or wheeze  Allergic rhinitis Continue allergen avoidance measures directed toward cockroach and dust mite as listed below Continue montelukast  10 mg once a day to control allergy symptoms Begin cetirizine  10 mg once a day if needed for runny nose or itch. This will replace Xyzal  for now Continue azelastine  2 sprays in each nostril up to twice a day if needed for runny nose Continue Flonase  2 sprays in each nostril once a day if needed for stuffy nose Consider saline nasal rinses as needed for nasal symptoms. Use this before any medicated nasal sprays for best result Consider allergen immunotherapy if your symptoms are not well-controlled with the treatment plan as listed above  Allergic conjunctivitis Some over the counter eye drops include Pataday  one drop in each eye once a day as needed for red, itchy eyes OR Zaditor one drop in each eye twice a day as needed for red itchy eyes. Avoid eye drops that say red eye relief as they may contain medications that dry out your eyes.   Food allergy  Continue to avoid shellfish.  In case of an allergic reaction, take Benadryl  50 mg every 4 hours, and if life-threatening  symptoms occur, inject with EpiPen  0.3 mg.  Nicotine  use Try to cut down and better to quit  Call the clinic if this treatment plan is not working well for you.  Follow up in 2 months or sooner if needed.   Return in about 2 months (around 12/24/2023), or if symptoms worsen or fail to improve.    Thank you for the opportunity to care for this patient.  Please do not hesitate to contact me with questions.  Marinus Sic, FNP [facility] 

## 2023-10-23 NOTE — Patient Instructions (Signed)
 Asthma Begin Spiriva 1.25 mcg-2 puffs once a day to prevent cough or wheeze Continue Advair  115-2 puffs twice a day with a spacer to prevent cough or wheeze This continue albuterol  2 puffs once every 4 hours if needed for cough or wheeze You may use albuterol  2 puffs 5 to 15 minutes before activity to decrease cough or wheeze  Allergic rhinitis Continue allergen avoidance measures directed toward cockroach and dust mite as listed below Continue montelukast  10 mg once a day to control allergy symptoms Begin cetirizine  10 mg once a day if needed for runny nose or itch. This will replace Xyzal  for now Continue azelastine  2 sprays in each nostril up to twice a day if needed for runny nose Continue Flonase  2 sprays in each nostril once a day if needed for stuffy nose Consider saline nasal rinses as needed for nasal symptoms. Use this before any medicated nasal sprays for best result Consider allergen immunotherapy if your symptoms are not well-controlled with the treatment plan as listed above  Allergic conjunctivitis Some over the counter eye drops include Pataday  one drop in each eye once a day as needed for red, itchy eyes OR Zaditor one drop in each eye twice a day as needed for red itchy eyes. Avoid eye drops that say red eye relief as they may contain medications that dry out your eyes.   Food allergy  Continue to avoid shellfish.  In case of an allergic reaction, take Benadryl  50 mg every 4 hours, and if life-threatening symptoms occur, inject with EpiPen  0.3 mg.  Nicotine  use Try to cut down and better to quit  Call the clinic if this treatment plan is not working well for you.  Follow up in 2 months or sooner if needed.   Control of Dust Mite Allergen Dust mites play a major role in allergic asthma and rhinitis. They occur in environments with high humidity wherever human skin is found. Dust mites absorb humidity from the atmosphere (ie, they do not drink) and feed on organic  matter (including shed human and animal skin). Dust mites are a microscopic type of insect that you cannot see with the naked eye. High levels of dust mites have been detected from mattresses, pillows, carpets, upholstered furniture, bed covers, clothes, soft toys and any woven material. The principal allergen of the dust mite is found in its feces. A gram of dust may contain 1,000 mites and 250,000 fecal particles. Mite antigen is easily measured in the air during house cleaning activities. Dust mites do not bite and do not cause harm to humans, other than by triggering allergies/asthma.  Ways to decrease your exposure to dust mites in your home:  1. Encase mattresses, box springs and pillows with a mite-impermeable barrier or cover  2. Wash sheets, blankets and drapes weekly in hot water (130 F) with detergent and dry them in a dryer on the hot setting.  3. Have the room cleaned frequently with a vacuum cleaner and a damp dust-mop. For carpeting or rugs, vacuuming with a vacuum cleaner equipped with a high-efficiency particulate air (HEPA) filter. The dust mite allergic individual should not be in a room which is being cleaned and should wait 1 hour after cleaning before going into the room.  4. Do not sleep on upholstered furniture (eg, couches).  5. If possible removing carpeting, upholstered furniture and drapery from the home is ideal. Horizontal blinds should be eliminated in the rooms where the person spends the most time (bedroom, study,  television room). Washable vinyl, roller-type shades are optimal.  6. Remove all non-washable stuffed toys from the bedroom. Wash stuffed toys weekly like sheets and blankets above.  7. Reduce indoor humidity to less than 50%. Inexpensive humidity monitors can be purchased at most hardware stores. Do not use a humidifier as can make the problem worse and are not recommended.  Control of Cockroach Allergen Cockroach allergen has been identified as an  important cause of acute attacks of asthma, especially in urban settings.  There are fifty-five species of cockroach that exist in the [place] , however only three, the [place], [place] and [place] species produce allergen that can affect patients with Asthma.  Allergens can be obtained from fecal particles, egg casings and secretions from cockroaches.    Remove food sources. Reduce access to water. Seal access and entry points. Spray runways with 0.5-1% Diazinon or Chlorpyrifos Blow boric acid power under stoves and refrigerator. Place bait stations (hydramethylnon) at feeding sites.

## 2023-10-24 ENCOUNTER — Other Ambulatory Visit: Payer: Self-pay

## 2023-10-24 ENCOUNTER — Encounter: Payer: Self-pay | Admitting: Family Medicine

## 2023-10-24 ENCOUNTER — Ambulatory Visit (INDEPENDENT_AMBULATORY_CARE_PROVIDER_SITE_OTHER): Admitting: Family Medicine

## 2023-10-24 VITALS — BP 122/70 | HR 72 | Temp 98.3°F | Resp 16 | Ht 63.0 in | Wt 247.5 lb

## 2023-10-24 DIAGNOSIS — H1013 Acute atopic conjunctivitis, bilateral: Secondary | ICD-10-CM | POA: Diagnosis not present

## 2023-10-24 DIAGNOSIS — F17200 Nicotine dependence, unspecified, uncomplicated: Secondary | ICD-10-CM | POA: Insufficient documentation

## 2023-10-24 DIAGNOSIS — H1010 Acute atopic conjunctivitis, unspecified eye: Secondary | ICD-10-CM | POA: Insufficient documentation

## 2023-10-24 DIAGNOSIS — J45998 Other asthma: Secondary | ICD-10-CM | POA: Insufficient documentation

## 2023-10-24 DIAGNOSIS — J3089 Other allergic rhinitis: Secondary | ICD-10-CM

## 2023-10-24 DIAGNOSIS — T7800XA Anaphylactic reaction due to unspecified food, initial encounter: Secondary | ICD-10-CM | POA: Insufficient documentation

## 2023-10-24 DIAGNOSIS — F17299 Nicotine dependence, other tobacco product, with unspecified nicotine-induced disorders: Secondary | ICD-10-CM

## 2023-10-24 DIAGNOSIS — T7800XD Anaphylactic reaction due to unspecified food, subsequent encounter: Secondary | ICD-10-CM

## 2023-10-24 MED ORDER — ADVAIR HFA 115-21 MCG/ACT IN AERO: 2.0000 | INHALATION_SPRAY | Freq: Two times a day (BID) | RESPIRATORY_TRACT | 5 refills | Status: AC

## 2023-10-24 MED ORDER — ALBUTEROL SULFATE HFA 108 (90 BASE) MCG/ACT IN AERS
1.0000 | INHALATION_SPRAY | Freq: Four times a day (QID) | RESPIRATORY_TRACT | 1 refills | Status: AC | PRN
Start: 2023-10-24 — End: ?

## 2023-11-23 ENCOUNTER — Encounter: Payer: Self-pay | Admitting: Family Medicine

## 2023-11-25 MED ORDER — MONTELUKAST SODIUM 10 MG PO TABS: 10.0000 mg | ORAL_TABLET | Freq: Every day | ORAL | 1 refills | Status: AC

## 2023-11-25 MED ORDER — CETIRIZINE HCL 10 MG PO TABS: 10.0000 mg | ORAL_TABLET | Freq: Every day | ORAL | 5 refills | Status: AC

## 2023-12-08 ENCOUNTER — Ambulatory Visit: Admitting: Obstetrics

## 2023-12-09 ENCOUNTER — Encounter: Payer: Self-pay | Admitting: Obstetrics and Gynecology

## 2023-12-09 ENCOUNTER — Ambulatory Visit (INDEPENDENT_AMBULATORY_CARE_PROVIDER_SITE_OTHER): Admitting: Obstetrics and Gynecology

## 2023-12-09 VITALS — BP 132/70 | HR 70 | Ht 63.0 in | Wt 245.0 lb

## 2023-12-09 DIAGNOSIS — N393 Stress incontinence (female) (male): Secondary | ICD-10-CM | POA: Insufficient documentation

## 2023-12-09 DIAGNOSIS — R32 Unspecified urinary incontinence: Secondary | ICD-10-CM

## 2023-12-09 DIAGNOSIS — Z6841 Body Mass Index (BMI) 40.0 and over, adult: Secondary | ICD-10-CM | POA: Diagnosis not present

## 2023-12-09 DIAGNOSIS — N3281 Overactive bladder: Secondary | ICD-10-CM | POA: Diagnosis not present

## 2023-12-09 LAB — POCT URINALYSIS DIP (CLINITEK)
Bilirubin, UA: NEGATIVE
Blood, UA: NEGATIVE
Glucose, UA: NEGATIVE mg/dL
Ketones, POC UA: NEGATIVE mg/dL
Leukocytes, UA: NEGATIVE
Nitrite, UA: NEGATIVE
POC PROTEIN,UA: NEGATIVE
Spec Grav, UA: 1.025 (ref 1.010–1.025)
Urobilinogen, UA: 0.2 U/dL
pH, UA: 6 (ref 5.0–8.0)

## 2023-12-09 MED ORDER — SOLIFENACIN SUCCINATE 5 MG PO TABS: 5.0000 mg | ORAL_TABLET | Freq: Every day | ORAL | 5 refills | Status: DC
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
## 2023-12-09 NOTE — Assessment & Plan Note (Signed)
-   For treatment of stress urinary incontinence,  non-surgical options include expectant management, weight loss, physical therapy, as well as a pessary.  Surgical options include a midurethral sling, and transurethral injection of a bulking agent. - She is interested in pelvic PT and a pessary. Will return for a fitting

## 2023-12-09 NOTE — Assessment & Plan Note (Signed)
-   We discussed the symptoms of overactive bladder (OAB), which include urinary urgency, urinary frequency, nocturia, with or without urge incontinence.  While we do not know the exact etiology of OAB, several treatment options exist. We discussed management including behavioral therapy (decreasing bladder irritants, urge suppression strategies, timed voids, bladder retraining), physical therapy, medication; for refractory cases posterior tibial nerve stimulation, sacral neuromodulation, and intravesical botulinum toxin injection.  - Prescribed vesicare  5mg  daily. For anticholinergic medications, we discussed the potential side effects of anticholinergics including dry eyes, dry mouth, constipation, cognitive impairment and urinary retention. - She is also interested in pelvic PT, referral placed

## 2023-12-09 NOTE — Patient Instructions (Addendum)
 Today we talked about ways to manage bladder urgency such as altering your diet to avoid irritative beverages and foods (bladder diet) as well as attempting to decrease stress and other exacerbating factors.   The Most Bothersome Foods* The Least Bothersome Foods*  Coffee - Regular & Decaf Tea - caffeinated Carbonated beverages - cola, non-colas, diet & caffeine -free Alcohols - Beer, Red Wine, White Wine, Champagne Fruits - Grapefruit, [place], Orange, [employer] - Cranberry, Grapefruit, Orange, Pineapple Vegetables - Tomato & Tomato Products Flavor Enhancers - Hot peppers, Spicy foods, Chili, Horseradish, Vinegar, Monosodium glutamate (MSG) Artificial Sweeteners - NutraSweet, Sweet 'N Low, Equal (sweetener), Saccharin Ethnic foods - [place], [place], [place] food [employer] - low-fat & whole Fruits - Bananas, Blueberries, Honeydew melon, Pears, Raisins, Watermelon Vegetables - Broccoli, [address] Sprouts, [place], Carrots, Cauliflower, [place], Cucumber, Mushrooms, Peas, Radishes, Squash, Zucchini, White potatoes, Sweet potatoes & yams Poultry - Chicken, Eggs, [place], [employer] - Beef, Diplomatic Services operational officer, Lamb Seafood - Shrimp, [place] fish, Salmon Grains - Oat, Rice Snacks - Pretzels, Popcorn  *Mitch ALF et al. Diet and its role in interstitial cystitis/bladder pain syndrome (IC/BPS) and comorbid conditions. [employer]. BJU Int. 2012 Jan 11.   For treatment of stress urinary incontinence, which is leakage with physical activity/movement/strainging/coughing, we discussed expectant management versus nonsurgical options versus surgery. Nonsurgical options include weight loss, physical therapy, as well as a pessary.  Surgical options include a midurethral sling, which is a synthetic mesh sling that acts like a hammock under the urethra to prevent leakage of urine, and transurethral injection of a bulking agent.  We discussed the symptoms of overactive bladder (OAB), which include urinary urgency,  urinary frequency, night-time urination, with or without urge incontinence.  We discussed management including behavioral therapy (decreasing bladder irritants by following a bladder diet, urge suppression strategies, timed voids, bladder retraining), physical therapy, medication; and for refractory cases posterior tibial nerve stimulation, sacral neuromodulation, and intravesical botulinum toxin injection.   For anticholinergic medications, we discussed the potential side effects of anticholinergics including dry eyes, dry mouth, constipation, rare risks of cognitive impairment and urinary retention.

## 2023-12-23 NOTE — Patient Instructions (Incomplete)
 Asthma -Continue Spiriva Respimat 1.25 mcg-2 puffs once a day to prevent cough or wheeze -Continue Advair  115/ 21 mcg-2 puffs twice a day with a spacer to prevent cough or wheeze - May use albuterol  2 puffs once every 4 to 6 hours as needed for cough, wheeze, tightness in chest, shortness of breath You also may use albuterol  2 puffs 5 to 15 minutes before strenuous physical activity to decrease cough or wheeze  Asthma control goals:  Full participation in all desired activities (may need albuterol  before activity) Albuterol  use two time or less a week on average (not counting use with activity) Cough interfering with sleep two time or less a month Oral steroids no more than once a year No hospitalizations  Allergic rhinitis -Continue allergen avoidance measures directed toward cockroach and dust mite as listed below -Continue montelukast  10 mg once a day to control allergy symptoms -Continue cetirizine  10 mg once a day if needed for runny nose or itch.  -Continue azelastine  2 sprays in each nostril up to twice a day if needed for runny nose -Continue Flonase  2 sprays in each nostril once a day if needed for stuffy nose -Consider saline nasal rinses as needed for nasal symptoms. Use this before any medicated nasal sprays for best result -Consider allergen immunotherapy if your symptoms are not well-controlled with the treatment plan as listed above  Allergic conjunctivitis -Some over the counter eye drops include Pataday  one drop in each eye once a day as needed for red, itchy eyes OR Zaditor one drop in each eye twice a day as needed for red itchy eyes. -Avoid eye drops that say red eye relief as they may contain medications that dry out your eyes.   Food allergy  -Continue to avoid shellfish.  In case of an allergic reaction, take Benadryl  50 mg every 4 hours, and if life-threatening symptoms occur, inject with EpiPen  0.3 mg.  Nicotine  use -Try to cut down and better to quit  Call  the clinic if this treatment plan is not working well for you.  Follow up in  months or sooner if needed.   Control of Dust Mite Allergen Dust mites play a major role in allergic asthma and rhinitis. They occur in environments with high humidity wherever human skin is found. Dust mites absorb humidity from the atmosphere (ie, they do not drink) and feed on organic matter (including shed human and animal skin). Dust mites are a microscopic type of insect that you cannot see with the naked eye. High levels of dust mites have been detected from mattresses, pillows, carpets, upholstered furniture, bed covers, clothes, soft toys and any woven material. The principal allergen of the dust mite is found in its feces. A gram of dust may contain 1,000 mites and 250,000 fecal particles. Mite antigen is easily measured in the air during house cleaning activities. Dust mites do not bite and do not cause harm to humans, other than by triggering allergies/asthma.  Ways to decrease your exposure to dust mites in your home:  1. Encase mattresses, box springs and pillows with a mite-impermeable barrier or cover  2. Wash sheets, blankets and drapes weekly in hot water (130 F) with detergent and dry them in a dryer on the hot setting.  3. Have the room cleaned frequently with a vacuum cleaner and a damp dust-mop. For carpeting or rugs, vacuuming with a vacuum cleaner equipped with a high-efficiency particulate air (HEPA) filter. The dust mite allergic individual should not be in  a room which is being cleaned and should wait 1 hour after cleaning before going into the room.  4. Do not sleep on upholstered furniture (eg, couches).  5. If possible removing carpeting, upholstered furniture and drapery from the home is ideal. Horizontal blinds should be eliminated in the rooms where the person spends the most time (bedroom, study, television room). Washable vinyl, roller-type shades are optimal.  6. Remove all  non-washable stuffed toys from the bedroom. Wash stuffed toys weekly like sheets and blankets above.  7. Reduce indoor humidity to less than 50%. Inexpensive humidity monitors can be purchased at most hardware stores. Do not use a humidifier as can make the problem worse and are not recommended.  Control of Cockroach Allergen Cockroach allergen has been identified as an important cause of acute attacks of asthma, especially in urban settings.  There are fifty-five species of cockroach that exist in the [place] , however only three, the [place], [place] and [place] species produce allergen that can affect patients with Asthma.  Allergens can be obtained from fecal particles, egg casings and secretions from cockroaches.    Remove food sources. Reduce access to water. Seal access and entry points. Spray runways with 0.5-1% Diazinon or Chlorpyrifos Blow boric acid power under stoves and refrigerator. Place bait stations (hydramethylnon) at feeding sites.

## 2023-12-24 ENCOUNTER — Ambulatory Visit: Admitting: Family

## 2023-12-29 ENCOUNTER — Ambulatory Visit (INDEPENDENT_AMBULATORY_CARE_PROVIDER_SITE_OTHER): Admitting: Obstetrics and Gynecology

## 2023-12-29 ENCOUNTER — Encounter: Payer: Self-pay | Admitting: Obstetrics and Gynecology

## 2023-12-29 VITALS — BP 126/84 | HR 85

## 2023-12-29 DIAGNOSIS — R32 Unspecified urinary incontinence: Secondary | ICD-10-CM

## 2023-12-29 DIAGNOSIS — N812 Incomplete uterovaginal prolapse: Secondary | ICD-10-CM

## 2023-12-29 DIAGNOSIS — N393 Stress incontinence (female) (male): Secondary | ICD-10-CM

## 2023-12-29 DIAGNOSIS — N3281 Overactive bladder: Secondary | ICD-10-CM

## 2023-12-29 MED ORDER — SOLIFENACIN SUCCINATE 10 MG PO TABS: 10.0000 mg | ORAL_TABLET | Freq: Every day | ORAL | 5 refills | Status: AC

## 2023-12-29 NOTE — Progress Notes (Signed)
 [facility]   Subjective:     Chief Complaint: Pessary fitting. Allison Chandler is a 33 y.o. female is here for pessary fitting.)  History of Present Illness: Allison Chandler is a 33 y.o. female with stress incontinence who presents today for a pessary fitting.   Patient reports a 25% improvement on vesicare  5mg  daily.   Past Medical History: Patient  has a past medical history of ADHD (attention deficit hyperactivity disorder), Amenorrhea, Anemia, Angio-edema, Anxiety, Asthma, Back pain, chronic, Bipolar 1 disorder (HCC), Chronic back pain, COVID, Depression, Heart murmur, History of kidney stones, History of pelvic inflammatory disease, HTN (hypertension), PCOD (polycystic ovarian disease), Pre-diabetes, and Sleep apnea.   Past Surgical History: She  has a past surgical history that includes Wisdom tooth extraction; ORIF ankle fracture (Right, 04/24/2022); and Hardware Removal (Right, 08/08/2023).   Medications: She has a current medication list which includes the following prescription(s): acetaminophen , advair  hfa, albuterol , amlodipine, amphetamine-dextroamphetamine, azelastine , cetirizine , cromolyn, desvenlafaxine, dicyclomine , epinephrine , fluticasone , ibuprofen , lactaid fast act, levocetirizine, loreev xr, montelukast , naphazoline-pheniramine, ondansetron , pantoprazole , prazosin, propranolol, restasis, solifenacin , spacer/aero-holding chambers, tizanidine, valacyclovir , and vraylar .   Allergies: Patient is allergic to other and wellbutrin [bupropion].   Social History: Patient  reports that she quit smoking about 10 years ago. Her smoking use included cigarettes. She has been exposed to tobacco smoke. She has never used smokeless tobacco. She reports current alcohol use. She reports that she does not use drugs.      Objective:    BP 126/84   Pulse 85   LMP 12/08/2023 (Exact Date)  Gen: No apparent distress, A&O x 3. Pelvic Exam: Normal external female genitalia;  Bartholin's and Skene's glands normal in appearance; urethral meatus normal in appearance, no urethral masses or discharge.   A size #2 incontinence dish pessary (Lot [id number]) was fitted. It was comfortable, stayed in place with valsalva and was an appropriate size on examination, with one finger fitting between the pessary and the vaginal walls. We tied a string to it and the patient demonstrated proper removal and replacement.    Assessment/Plan:    Assessment: Allison Chandler is a 33 y.o. with stage II pelvic organ prolapse who presents for a pessary fitting. Plan: She was fitted with a #2 incontinence dish pessary. She will keep the pessary in place until next visit. She will use lubricant.   Vesicare  increased to 10mg  daily.   Follow-up in 6 weeks for a pessary check or sooner as needed.  All questions were answered.    Allison Chandler G Lytle Malburg, NP

## 2023-12-29 NOTE — Patient Instructions (Signed)
 Wash the pessary with warm water and soap. Dove or cetaphil unscented is preferred.   Dogs and cats will try to steal pessary. Make sure to keep it in a closed container like a dental cup when not in use.  If you have any issues don't hesitate to call.   Remove pessary at least 1 time per week or for intercourse.

## 2024-01-07 ENCOUNTER — Institutional Professional Consult (permissible substitution): Admitting: Bariatrics

## 2024-01-08 ENCOUNTER — Encounter: Payer: Self-pay | Admitting: Family Medicine

## 2024-01-13 MED ORDER — SPIRIVA RESPIMAT 1.25 MCG/ACT IN AERS: INHALATION_SPRAY | RESPIRATORY_TRACT | 5 refills | Status: AC

## 2024-01-14 MED ORDER — FLUTICASONE PROPIONATE 50 MCG/ACT NA SUSP: 2.0000 | Freq: Every day | NASAL | 5 refills | Status: AC

## 2024-01-14 NOTE — Addendum Note (Signed)
 Addended by: Ryne Mctigue E on: 01/14/2024 05:21 PM   Modules accepted: Orders

## 2024-01-28 ENCOUNTER — Institutional Professional Consult (permissible substitution): Admitting: Bariatrics

## 2024-02-02 ENCOUNTER — Other Ambulatory Visit: Payer: Self-pay | Admitting: Gastroenterology

## 2024-02-02 DIAGNOSIS — R197 Diarrhea, unspecified: Secondary | ICD-10-CM

## 2024-02-04 NOTE — Progress Notes (Signed)
 06/04/22- 33 yoF Smoker (1 ppd) for sleep evaluation courtesy of Lannie Gell, PA-C, with concern of OSA on CPAP Medical problem list includes- HTN, Asthma, Allergic Rhinitis, Hypothyroid, PCOS, Obesity, Depression/ BiPolar,   NPS[facility] Allison Chandler [phone]/21/19- results pending to media tab CPAP auto 7-14/ [facility] Epworth score- Body weight today-234 lbs Covid vax- Flu vax- - Vyvanse,  Has used CPAP routinely. Needs to establish for supplies. Sleeps much better with CPAP- no more daytime tiredness and fiance reports she is not snoring. No ENT surgery. Asthma managed by Allergist. Born with mild heart murmur. Denies pregnant. Current machine is original, new in 2021. CXR 12/13/21- IMPRESSION: No active disease.    02/05/24-  33 yoF Smoker (1 ppd) followed for OSA Medical problem list includes- HTN, Asthma( managed by her Allergist), Allergic Rhinitis, Hypothyroid, PCOS, Obesity, Depression/ BiPolar,   [facility]PS[facility] Allison Chandler [phone]/21/19- results pending to media tab  May need to see if [facility] has this?? CPAP auto 5-15/ [facility]   refreshed order with [facility] in January, 2025 Download compliance- SD card reader not working Body weight today- 244 lbs -----Patient states she wakes up coughing. Sometimes notes an odor from CPAP. Discussed cleaning. Discussed the use of AI scribe software for clinical note transcription with the patient, who gave verbal consent to proceed.  History of Present Illness   Allison Chandler is a 33 year old female with OSA, asthma and reflux who presents with nighttime coughing while using CPAP.  She experiences nighttime coughing episodes while using her CPAP machine, which resolve quickly after using her inhaler. There is no daytime cough or wheeze. Her asthma is managed by her allergist, and she uses an inhaler as needed. She has reflux and sleeps on a standard bed without positional changes. She continues to  benefit from CPAP.     Assessment and Plan:    Asthma- mild intermittent uncomplicated Obstructive sleep apnea She uses CPAP effectively but reports occasional coughing, possibly due to asthma or reflux. Concerns about CPAP odor and cleaning were noted. Reflux and asthma are managed by her allergist. - Contact home care company for CPAP maintenance and cleaning assistance. - Request updated CPAP settings download from [employer]. - Advise elevating head of bed with bricks to reduce reflux. - Coordinate with allergist for asthma management.     ROS-see HPI   + = positive Constitutional:    weight loss, night sweats, fevers, chills, fatigue, lassitude. HEENT:    headaches, difficulty swallowing, tooth/dental problems, sore throat,       sneezing, itching, ear ache, nasal congestion, post nasal drip, snoring CV:    chest pain, orthopnea, PND, swelling in lower extremities, anasarca,                                   dizziness, palpitations Resp:   shortness of breath with exertion or at rest.                productive cough,   non-productive cough, coughing up of blood.              change in color of mucus.  wheezing.   Skin:    rash or lesions. GI:  No-   heartburn, indigestion, abdominal pain, nausea, vomiting, diarrhea,                 change in bowel habits, loss of appetite GU: dysuria, change  in color of urine, no urgency or frequency.   flank pain. MS:   joint pain, stiffness, decreased range of motion, back pain. Neuro-     nothing unusual Psych:  change in mood or affect.  depression or anxiety.   memory loss.  OBJ- Physical Exam General- Alert, Oriented, Affect-appropriate, Distress- none acute, + obese Skin- rash-none, lesions- none, excoriation- none Lymphadenopathy- none Head- atraumatic            Eyes- Gross vision intact, PERRLA, conjunctivae and secretions clear            Ears- Hearing, canals-normal            Nose- Clear, no-Septal dev, mucus, polyps, erosion,  perforation             Throat- Mallampati III , mucosa clear , drainage- none, tonsils+, teeth+.  Neck- flexible , trachea midline, no stridor , thyroid nl, carotid no bruit Chest - symmetrical excursion , unlabored           Heart/CV- RRR , murmur+1D , no gallop  , no rub, nl s1 s2                           - JVD- none , edema- none, stasis changes- none, varices- none           Lung- clear to P&A, wheeze- none, cough- none , dullness-none, rub- none           Chest wall-  Abd-  Br/ Gen/ Rectal- Not done, not indicated Extrem- +R foot in ortho boot/ cane Neuro- grossly intact to observation

## 2024-02-05 ENCOUNTER — Encounter: Payer: Self-pay | Admitting: Internal Medicine

## 2024-02-05 ENCOUNTER — Ambulatory Visit (INDEPENDENT_AMBULATORY_CARE_PROVIDER_SITE_OTHER): Admitting: Internal Medicine

## 2024-02-05 VITALS — BP 134/82 | HR 79 | Temp 98.6°F | Ht 63.0 in | Wt 244.0 lb

## 2024-02-05 DIAGNOSIS — J4520 Mild intermittent asthma, uncomplicated: Secondary | ICD-10-CM

## 2024-02-05 DIAGNOSIS — F17210 Nicotine dependence, cigarettes, uncomplicated: Secondary | ICD-10-CM | POA: Diagnosis not present

## 2024-02-05 DIAGNOSIS — G4733 Obstructive sleep apnea (adult) (pediatric): Secondary | ICD-10-CM

## 2024-02-05 NOTE — Patient Instructions (Addendum)
 The cough might be from reflux at night-  Try putting 1 brick under each ogf the head legs of your bed for a little elevation to keep stomach acid down hill.  If you think the cough is from asthma- discuss with your allergist  Order- DME [facility]- please service CPAP machine for odor, and please send us  current download. Continue mask of choice, supplies as needed.

## 2024-02-09 ENCOUNTER — Ambulatory Visit: Admitting: Obstetrics and Gynecology

## 2024-03-10 NOTE — Progress Notes (Deleted)
   [address] [place] [address] Dept: [phone]  FOLLOW UP NOTE  Patient ID: Allison Chandler, female    DOB: 12-02-1990  Age: 33 y.o. MRN: [id number] Date of Office Visit: 03/11/2024  Assessment  Chief Complaint: No chief complaint on file.  HPI Allison Chandler is a 33 year old female who presents to the clinic for a follow up visit. She was last seen in this clinic on 10/24/2023 by Arlean Mutter, FNP, for evaluation of asthma, allergic rhinitis, allergic conjunctivitis, food allergy to shellfish, and nicotine  use.   Her last environmental allergy skin testing was on 03/29/2021 was positive to cockroach and dust mite.  Her last food allergy testing was on 03/29/2021 and was equivocal to oyster.  Prior last food allergy testing via lab was on 08/27/2022 and was positive to shellfish.  Discussed the use of AI scribe software for clinical note transcription with the patient, who gave verbal consent to proceed.  History of Present Illness      Drug Allergies:  Allergies  Allergen Reactions   Other Other (See Comments)    Dairy - GI issues    Wellbutrin [Bupropion] Rash    Physical Exam: There were no vitals taken for this visit.   Physical Exam  Diagnostics:    Assessment and Plan: No diagnosis found.  No orders of the defined types were placed in this encounter.   There are no Patient Instructions on file for this visit.  No follow-ups on file.    Thank you for the opportunity to care for this patient.  Please do not hesitate to contact me with questions.  Arlean Mutter, FNP [facility]

## 2024-03-10 NOTE — Patient Instructions (Incomplete)
 Asthma Begin Spiriva 1.25 mcg-2 puffs once a day to prevent cough or wheeze Continue Advair  115-2 puffs twice a day with a spacer to prevent cough or wheeze This continue albuterol  2 puffs once every 4 hours if needed for cough or wheeze You may use albuterol  2 puffs 5 to 15 minutes before activity to decrease cough or wheeze  Allergic rhinitis Continue allergen avoidance measures directed toward cockroach and dust mite as listed below Continue montelukast  10 mg once a day to control allergy symptoms Begin cetirizine  10 mg once a day if needed for runny nose or itch. This will replace Xyzal  for now Continue azelastine  2 sprays in each nostril up to twice a day if needed for runny nose Continue Flonase  2 sprays in each nostril once a day if needed for stuffy nose Consider saline nasal rinses as needed for nasal symptoms. Use this before any medicated nasal sprays for best result Consider allergen immunotherapy if your symptoms are not well-controlled with the treatment plan as listed above  Allergic conjunctivitis Some over the counter eye drops include Pataday  one drop in each eye once a day as needed for red, itchy eyes OR Zaditor one drop in each eye twice a day as needed for red itchy eyes. Avoid eye drops that say red eye relief as they may contain medications that dry out your eyes.   Food allergy  Continue to avoid shellfish.  In case of an allergic reaction, take Benadryl  50 mg every 4 hours, and if life-threatening symptoms occur, inject with EpiPen  0.3 mg.  Nicotine  use Try to cut down and better to quit  Call the clinic if this treatment plan is not working well for you.  Follow up in 2 months or sooner if needed.   Control of Dust Mite Allergen Dust mites play a major role in allergic asthma and rhinitis. They occur in environments with high humidity wherever human skin is found. Dust mites absorb humidity from the atmosphere (ie, they do not drink) and feed on organic  matter (including shed human and animal skin). Dust mites are a microscopic type of insect that you cannot see with the naked eye. High levels of dust mites have been detected from mattresses, pillows, carpets, upholstered furniture, bed covers, clothes, soft toys and any woven material. The principal allergen of the dust mite is found in its feces. A gram of dust may contain 1,000 mites and 250,000 fecal particles. Mite antigen is easily measured in the air during house cleaning activities. Dust mites do not bite and do not cause harm to humans, other than by triggering allergies/asthma.  Ways to decrease your exposure to dust mites in your home:  1. Encase mattresses, box springs and pillows with a mite-impermeable barrier or cover  2. Wash sheets, blankets and drapes weekly in hot water (130 F) with detergent and dry them in a dryer on the hot setting.  3. Have the room cleaned frequently with a vacuum cleaner and a damp dust-mop. For carpeting or rugs, vacuuming with a vacuum cleaner equipped with a high-efficiency particulate air (HEPA) filter. The dust mite allergic individual should not be in a room which is being cleaned and should wait 1 hour after cleaning before going into the room.  4. Do not sleep on upholstered furniture (eg, couches).  5. If possible removing carpeting, upholstered furniture and drapery from the home is ideal. Horizontal blinds should be eliminated in the rooms where the person spends the most time (bedroom, study,  television room). Washable vinyl, roller-type shades are optimal.  6. Remove all non-washable stuffed toys from the bedroom. Wash stuffed toys weekly like sheets and blankets above.  7. Reduce indoor humidity to less than 50%. Inexpensive humidity monitors can be purchased at most hardware stores. Do not use a humidifier as can make the problem worse and are not recommended.  Control of Cockroach Allergen Cockroach allergen has been identified as an  important cause of acute attacks of asthma, especially in urban settings.  There are fifty-five species of cockroach that exist in the [place] , however only three, the [place], [place] and [place] species produce allergen that can affect patients with Asthma.  Allergens can be obtained from fecal particles, egg casings and secretions from cockroaches.    Remove food sources. Reduce access to water. Seal access and entry points. Spray runways with 0.5-1% Diazinon or Chlorpyrifos Blow boric acid power under stoves and refrigerator. Place bait stations (hydramethylnon) at feeding sites.

## 2024-03-11 ENCOUNTER — Ambulatory Visit: Admitting: Family Medicine

## 2024-03-31 ENCOUNTER — Telehealth: Payer: Self-pay

## 2024-03-31 ENCOUNTER — Other Ambulatory Visit (HOSPITAL_COMMUNITY): Payer: Self-pay

## 2024-03-31 NOTE — Telephone Encounter (Signed)
*  [facility]  Pharmacy Patient Advocate Encounter   Received notification from CoverMyMeds that prior authorization for Advair  HFA 115-21MCG/ACT aerosol   is required/requested.   Insurance verification completed.   The patient is insured through N/A.  CMM Key: [id number]

## 2024-04-05 ENCOUNTER — Encounter: Payer: Self-pay | Admitting: Radiology

## 2024-04-12 NOTE — Telephone Encounter (Signed)
 I called and left voice mail for a return call.

## 2024-05-12 ENCOUNTER — Other Ambulatory Visit: Payer: Self-pay | Admitting: Family Medicine

## 2024-05-13 ENCOUNTER — Other Ambulatory Visit: Payer: Self-pay | Admitting: Gastroenterology

## 2024-05-13 DIAGNOSIS — R197 Diarrhea, unspecified: Secondary | ICD-10-CM

## 2024-05-25 ENCOUNTER — Other Ambulatory Visit: Payer: Self-pay | Admitting: Family Medicine

## 2024-06-06 ENCOUNTER — Encounter: Payer: Self-pay | Admitting: Orthopedic Surgery

## 2024-06-14 ENCOUNTER — Encounter: Payer: Self-pay | Admitting: *Deleted

## 2024-06-15 ENCOUNTER — Ambulatory Visit: Admitting: Physician Assistant

## 2024-06-16 ENCOUNTER — Ambulatory Visit: Admitting: Physician Assistant

## 2024-06-23 ENCOUNTER — Ambulatory Visit: Admitting: Physician Assistant

## 2024-06-24 ENCOUNTER — Ambulatory Visit

## 2024-06-25 ENCOUNTER — Ambulatory Visit: Admitting: Physician Assistant

## 2024-06-30 ENCOUNTER — Ambulatory Visit: Admitting: Physician Assistant

## 2024-06-30 NOTE — Progress Notes (Unsigned)
 "  Office Visit Note   Patient: Allison Chandler           Date of Birth: 1990-07-27           MRN: [id number] Visit Date: 06/30/2024              Requested by: Valma Lannie LABOR, PA-C [address] [place],  [place] [address] PCP: Valma Lannie LABOR, PA-C  No chief complaint on file.     HPI: ***  Assessment & Plan: Visit Diagnoses: No diagnosis found.  Plan: ***  Follow-Up Instructions: No follow-ups on file.   Ortho Exam  Patient is alert, oriented, no adenopathy, well-dressed, normal affect, normal respiratory effort. ***    Imaging: No results found. No images are attached to the encounter.  Labs: Lab Results  Component Value Date   REPTSTATUS 11/20/2019 FINAL 11/18/2019   GRAMSTAIN  04/09/2018    NO WBC SEEN NO ORGANISMS SEEN Gram Stain Report Called to,Read Back By and Verified With: [facility] @ 1325 ON [id number] BY POTEAT,S Performed at [facility], [address], [place], [place] [address]    CULT MULTIPLE SPECIES PRESENT, SUGGEST RECOLLECTION (A) 11/18/2019     Lab Results  Component Value Date   ALBUMIN 4.1 03/11/2020   ALBUMIN 4.0 11/18/2019   ALBUMIN 4.3 10/23/2019    No results found for: MG No results found for: VD25OH  No results found for: PREALBUMIN    Latest Ref Rng & Units 08/08/2023    5:54 AM 04/24/2022   12:55 PM 12/13/2021    9:56 AM  CBC EXTENDED  WBC 4.0 - 10.5 K/uL 9.2  11.7  9.9   RBC 3.87 - 5.11 MIL/uL 4.14  4.47  4.31   Hemoglobin 12.0 - 15.0 g/dL 88.1  87.1  87.6   HCT 36.0 - 46.0 % 35.8  40.4  37.2   Platelets 150 - 400 K/uL 274  311  284      There is no height or weight on file to calculate BMI.  Orders:  No orders of the defined types were placed in this encounter.  No orders of the defined types were placed in this encounter.    Procedures: No procedures performed  Clinical Data: No additional findings.  ROS:  All other systems negative, except as noted in  the HPI. Review of Systems  Objective: Vital Signs: There were no vitals taken for this visit.  Specialty Comments:  No specialty comments available.  PMFS History: Patient Active Problem List   Diagnosis Date Noted   Overactive bladder 12/09/2023   SUI (stress urinary incontinence, female) 12/09/2023   Poorly controlled persistent asthma 10/24/2023   Seasonal allergic conjunctivitis 10/24/2023   Nicotine  dependence 10/24/2023   Allergy with anaphylaxis due to food 10/24/2023   Pain from implanted hardware 08/08/2023   Nausea and vomiting 08/06/2022   OSA (obstructive sleep apnea) 06/04/2022   Closed right ankle fracture 04/22/2022   Mild intermittent asthma without complication 10/31/2021   Perennial allergic rhinitis 03/29/2021   Allergic conjunctivitis of both eyes 03/29/2021   Shortness of breath 03/29/2021   Other adverse food reactions, not elsewhere classified, subsequent encounter 03/29/2021   Prediabetes 10/09/2020   Environmental allergies 08/31/2020   Essential hypertension 08/31/2020   RUQ abdominal pain 09/10/2019   Rectal bleeding 09/10/2019   Amenorrhea 04/29/2018   Hypothyroidism 04/29/2018   Pain in female genitalia on intercourse 04/29/2018   Magnetic resonance imaging of brain abnormal 04/24/2018  SIRS (systemic inflammatory response syndrome) (HCC) 04/09/2018   Bipolar disorder (HCC) 04/09/2018   ADD (attention deficit disorder) 04/09/2018   Upper respiratory tract infection 08/13/2017   Morbid obesity due to excess calories (HCC) 02/12/2016   High triglycerides 07/19/2015   Chronic back pain 04/21/2015   Chronic pelvic pain in female 04/21/2015   Nevus 04/21/2015   PCOS (polycystic ovarian syndrome) 04/21/2015   Past Medical History:  Diagnosis Date   ADHD (attention deficit hyperactivity disorder)    ADHD   Amenorrhea    Anemia    only when pregnant   Angio-edema    Anxiety    Asthma    Back pain, chronic    Bipolar 1 disorder (HCC)     Chronic back pain    COVID    mild case - Summer 2023   Depression    Heart murmur    as an infant   History of kidney stones    History of pelvic inflammatory disease    HTN (hypertension)    PCOD (polycystic ovarian disease)    Pre-diabetes    Sleep apnea    uses a cpap    Family History  Problem Relation Age of Onset   Hypertension Mother    Ulcerative colitis Mother    Diabetes Mother    Heart disease Mother    Hyperlipidemia Mother    Colon cancer Neg Hx    Colon polyps Neg Hx    Esophageal cancer Neg Hx    Rectal cancer Neg Hx    Stomach cancer Neg Hx     Past Surgical History:  Procedure Laterality Date   HARDWARE REMOVAL Right 08/08/2023   Procedure: REMOVAL DEEP HARDWARE RIGHT ANKLE;  Surgeon: Harden Jerona GAILS, MD;  Location: [facility] OR;  Service: Orthopedics;  Laterality: Right;   ORIF ANKLE FRACTURE Right 04/24/2022   Procedure: OPEN REDUCTION INTERNAL FIXATION (ORIF) RIGHT ANKLE FRACTURE;  Surgeon: Harden Jerona GAILS, MD;  Location: [facility] OR;  Service: Orthopedics;  Laterality: Right;   WISDOM TOOTH EXTRACTION     Social History   Occupational History   Occupation: Metallurgist  Tobacco Use   Smoking status: Former    Current packs/day: 0.00    Types: Cigarettes    Quit date: 12/08/2013    Years since quitting: 10.5    Passive exposure: Past   Smokeless tobacco: Never  Vaping Use   Vaping status: Every Day   Substances: Nicotine , Flavoring  Substance and Sexual Activity   Alcohol use: Yes    Comment: occasionally   Drug use: No   Sexual activity: Yes    Partners: Male    Comment: partner has vasectomy       "

## 2024-07-06 ENCOUNTER — Ambulatory Visit

## 2024-07-06 VITALS — BP 120/74 | HR 72 | Temp 97.3°F | Ht 63.0 in | Wt 259.0 lb

## 2024-07-06 DIAGNOSIS — J301 Allergic rhinitis due to pollen: Secondary | ICD-10-CM

## 2024-07-06 DIAGNOSIS — Z716 Tobacco abuse counseling: Secondary | ICD-10-CM | POA: Diagnosis not present

## 2024-07-06 DIAGNOSIS — G4733 Obstructive sleep apnea (adult) (pediatric): Secondary | ICD-10-CM | POA: Diagnosis not present

## 2024-07-06 DIAGNOSIS — J4530 Mild persistent asthma, uncomplicated: Secondary | ICD-10-CM | POA: Diagnosis not present

## 2024-07-06 NOTE — Assessment & Plan Note (Deleted)
 SABRA

## 2024-07-06 NOTE — Assessment & Plan Note (Addendum)
 Allison Chandler

## 2024-07-06 NOTE — Addendum Note (Signed)
 Addended by: Ersel Wadleigh M on: 07/06/2024 03:37 PM   Modules accepted: Orders

## 2024-07-08 ENCOUNTER — Encounter (INDEPENDENT_AMBULATORY_CARE_PROVIDER_SITE_OTHER): Payer: Self-pay

## 2024-09-01 ENCOUNTER — Encounter

## 2024-09-01 ENCOUNTER — Ambulatory Visit
